# Patient Record
Sex: Female | Born: 1995 | Race: Black or African American | Hispanic: No | Marital: Single | Smoking: Never smoker
Health system: Southern US, Community
[De-identification: ages and names within clinical notes are randomized; demographics above are authoritative.]

## PROBLEM LIST (undated history)

## (undated) ENCOUNTER — Inpatient Hospital Stay (HOSPITAL_COMMUNITY): Payer: Self-pay

## (undated) ENCOUNTER — Ambulatory Visit (HOSPITAL_COMMUNITY): Admission: EM | Payer: No Typology Code available for payment source | Source: Home / Self Care

## (undated) ENCOUNTER — Ambulatory Visit: Source: Home / Self Care

## (undated) DIAGNOSIS — R51 Headache: Secondary | ICD-10-CM

## (undated) DIAGNOSIS — R112 Nausea with vomiting, unspecified: Secondary | ICD-10-CM

## (undated) DIAGNOSIS — Z9889 Other specified postprocedural states: Secondary | ICD-10-CM

## (undated) DIAGNOSIS — I10 Essential (primary) hypertension: Secondary | ICD-10-CM

## (undated) DIAGNOSIS — D649 Anemia, unspecified: Secondary | ICD-10-CM

## (undated) DIAGNOSIS — K2970 Gastritis, unspecified, without bleeding: Secondary | ICD-10-CM

## (undated) DIAGNOSIS — G43909 Migraine, unspecified, not intractable, without status migrainosus: Secondary | ICD-10-CM

## (undated) DIAGNOSIS — K219 Gastro-esophageal reflux disease without esophagitis: Secondary | ICD-10-CM

## (undated) DIAGNOSIS — N390 Urinary tract infection, site not specified: Secondary | ICD-10-CM

## (undated) DIAGNOSIS — R519 Headache, unspecified: Secondary | ICD-10-CM

## (undated) DIAGNOSIS — S62307A Unspecified fracture of fifth metacarpal bone, left hand, initial encounter for closed fracture: Secondary | ICD-10-CM

## (undated) HISTORY — PX: OTHER SURGICAL HISTORY: SHX169

## (undated) HISTORY — DX: Migraine, unspecified, not intractable, without status migrainosus: G43.909

## (undated) HISTORY — PX: WISDOM TOOTH EXTRACTION: SHX21

## (undated) HISTORY — PX: MOUTH SURGERY: SHX715

## (undated) HISTORY — PX: NO PAST SURGERIES: SHX2092

## (undated) HISTORY — DX: Essential (primary) hypertension: I10

## (undated) HISTORY — PX: MULTIPLE TOOTH EXTRACTIONS: SHX2053

---
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
## 2016-03-09 ENCOUNTER — Inpatient Hospital Stay (HOSPITAL_COMMUNITY)
Admission: AD | Admit: 2016-03-09 | Discharge: 2016-03-09 | Disposition: A | Discharge status: Home / Self Care | Payer: 59 | Source: Ambulatory Visit | Attending: Family Medicine | Admitting: Family Medicine

## 2016-03-09 ENCOUNTER — Encounter (HOSPITAL_COMMUNITY): Payer: Self-pay | Admitting: *Deleted

## 2016-03-09 DIAGNOSIS — K219 Gastro-esophageal reflux disease without esophagitis: Secondary | ICD-10-CM | POA: Diagnosis not present

## 2016-03-09 DIAGNOSIS — Z3202 Encounter for pregnancy test, result negative: Secondary | ICD-10-CM | POA: Insufficient documentation

## 2016-03-09 DIAGNOSIS — R112 Nausea with vomiting, unspecified: Secondary | ICD-10-CM | POA: Diagnosis not present

## 2016-03-09 DIAGNOSIS — R109 Unspecified abdominal pain: Secondary | ICD-10-CM | POA: Diagnosis present

## 2016-03-09 HISTORY — DX: Headache: R51

## 2016-03-09 HISTORY — DX: Headache, unspecified: R51.9

## 2016-03-09 LAB — URINALYSIS, ROUTINE W REFLEX MICROSCOPIC
BILIRUBIN URINE: NEGATIVE
Glucose, UA: NEGATIVE mg/dL
HGB URINE DIPSTICK: NEGATIVE
Ketones, ur: NEGATIVE mg/dL
Leukocytes, UA: NEGATIVE
Nitrite: NEGATIVE
PH: 7 (ref 5.0–8.0)
Protein, ur: NEGATIVE mg/dL
Specific Gravity, Urine: 1.015 (ref 1.005–1.030)

## 2016-03-09 LAB — POCT PREGNANCY, URINE: Preg Test, Ur: NEGATIVE

## 2016-03-09 MED ORDER — ONDANSETRON 4 MG PO TBDP: 4.0000 mg | ORAL_TABLET | Freq: Four times a day (QID) | ORAL | 0 refills | Status: DC | PRN

## 2016-03-09 MED ORDER — ONDANSETRON 4 MG PO TBDP
4.0000 mg | ORAL_TABLET | Freq: Four times a day (QID) | ORAL | Status: DC | PRN
  Administered 2016-03-09: 4 mg via ORAL
  Filled 2016-03-09: qty 1

## 2016-03-09 MED ORDER — GI COCKTAIL ~~LOC~~
30.0000 mL | Freq: Once | ORAL | Status: AC
  Administered 2016-03-09: 30 mL via ORAL
  Filled 2016-03-09: qty 30

## 2016-03-09 MED ORDER — OMEPRAZOLE 20 MG PO CPDR: 20.0000 mg | DELAYED_RELEASE_CAPSULE | Freq: Every day | ORAL | 1 refills | Status: DC

## 2016-03-09 NOTE — Discharge Instructions (Signed)
Nausea and Vomiting °Nausea is a sick feeling that often comes before throwing up (vomiting). Vomiting is a reflex where stomach contents come out of your mouth. Vomiting can cause severe loss of body fluids (dehydration). Children and elderly adults can become dehydrated quickly, especially if they also have diarrhea. Nausea and vomiting are symptoms of a condition or disease. It is important to find the cause of your symptoms. °CAUSES  °· Direct irritation of the stomach lining. This irritation can result from increased acid production (gastroesophageal reflux disease), infection, food poisoning, taking certain medicines (such as nonsteroidal anti-inflammatory drugs), alcohol use, or tobacco use. °· Signals from the brain. These signals could be caused by a headache, heat exposure, an inner ear disturbance, increased pressure in the brain from injury, infection, a tumor, or a concussion, pain, emotional stimulus, or metabolic problems. °· An obstruction in the gastrointestinal tract (bowel obstruction). °· Illnesses such as diabetes, hepatitis, gallbladder problems, appendicitis, kidney problems, cancer, sepsis, atypical symptoms of a heart attack, or eating disorders. °· Medical treatments such as chemotherapy and radiation. °· Receiving medicine that makes you sleep (general anesthetic) during surgery. °DIAGNOSIS °Your caregiver may ask for tests to be done if the problems do not improve after a few days. Tests may also be done if symptoms are severe or if the reason for the nausea and vomiting is not clear. Tests may include: °· Urine tests. °· Blood tests. °· Stool tests. °· Cultures (to look for evidence of infection). °· X-rays or other imaging studies. °Test results can help your caregiver make decisions about treatment or the need for additional tests. °TREATMENT °You need to stay well hydrated. Drink frequently but in small amounts. You may wish to drink water, sports drinks, clear broth, or eat frozen  ice pops or gelatin dessert to help stay hydrated. When you eat, eating slowly may help prevent nausea. There are also some antinausea medicines that may help prevent nausea. °HOME CARE INSTRUCTIONS  °· Take all medicine as directed by your caregiver. °· If you do not have an appetite, do not force yourself to eat. However, you must continue to drink fluids. °· If you have an appetite, eat a normal diet unless your caregiver tells you differently. °¨ Eat a variety of complex carbohydrates (rice, wheat, potatoes, bread), lean meats, yogurt, fruits, and vegetables. °¨ Avoid high-fat foods because they are more difficult to digest. °· Drink enough water and fluids to keep your urine clear or pale yellow. °· If you are dehydrated, ask your caregiver for specific rehydration instructions. Signs of dehydration may include: °¨ Severe thirst. °¨ Dry lips and mouth. °¨ Dizziness. °¨ Dark urine. °¨ Decreasing urine frequency and amount. °¨ Confusion. °¨ Rapid breathing or pulse. °SEEK IMMEDIATE MEDICAL CARE IF:  °· You have blood or brown flecks (like coffee grounds) in your vomit. °· You have black or bloody stools. °· You have a severe headache or stiff neck. °· You are confused. °· You have severe abdominal pain. °· You have chest pain or trouble breathing. °· You do not urinate at least once every 8 hours. °· You develop cold or clammy skin. °· You continue to vomit for longer than 24 to 48 hours. °· You have a fever. °MAKE SURE YOU:  °· Understand these instructions. °· Will watch your condition. °· Will get help right away if you are not doing well or get worse. °  °This information is not intended to replace advice given to you by your health care provider. Make sure   you discuss any questions you have with your health care provider. °  °Document Released: 06/01/2005 Document Revised: 08/24/2011 Document Reviewed: 10/29/2010 °Elsevier Interactive Patient Education ©2016 [employer] ° °Food Choices for Gastroesophageal  Reflux Disease, Adult °When you have gastroesophageal reflux disease (GERD), the foods you eat and your eating habits are very important. Choosing the right foods can help ease the discomfort of GERD. °WHAT GENERAL GUIDELINES DO I NEED TO FOLLOW? °· Choose fruits, vegetables, whole grains, low-fat dairy products, and low-fat meat, fish, and poultry. °· Limit fats such as oils, salad dressings, butter, nuts, and avocado. °· Keep a food diary to identify foods that cause symptoms. °· Avoid foods that cause reflux. These may be different for different people. °· Eat frequent small meals instead of three large meals each day. °· Eat your meals slowly, in a relaxed setting. °· Limit fried foods. °· Cook foods using methods other than frying. °· Avoid drinking alcohol. °· Avoid drinking large amounts of liquids with your meals. °· Avoid bending over or lying down until 2-3 hours after eating. °WHAT FOODS ARE NOT RECOMMENDED? °The following are some foods and drinks that may worsen your symptoms: °Vegetables °Tomatoes. Tomato juice. Tomato and spaghetti sauce. Chili peppers. Onion and garlic. Horseradish. °Fruits °Oranges, grapefruit, and lemon (fruit and juice). °Meats °High-fat meats, fish, and poultry. This includes hot dogs, ribs, ham, sausage, salami, and bacon. °Dairy °Whole milk and chocolate milk. Sour cream. Cream. Butter. Ice cream. Cream cheese.  °Beverages °Coffee and tea, with or without caffeine. Carbonated beverages or energy drinks. °Condiments °Hot sauce. Barbecue sauce.  °Sweets/Desserts °Chocolate and cocoa. Donuts. Peppermint and spearmint. °Fats and Oils °High-fat foods, including French fries and potato chips. °Other °Vinegar. Strong spices, such as black pepper, white pepper, red pepper, cayenne, curry powder, cloves, ginger, and chili powder. °The items listed above may not be a complete list of foods and beverages to avoid. Contact your dietitian for more information. °  °This information is not  intended to replace advice given to you by your health care provider. Make sure you discuss any questions you have with your health care provider. °  °Document Released: 06/01/2005 Document Revised: 06/22/2014 Document Reviewed: 04/05/2013 °Elsevier Interactive Patient Education ©2016 [employer] ° °

## 2016-03-09 NOTE — MAU Note (Signed)
Drenda FreezeFran C-Dishmon, CNM seeing patient in triage.

## 2016-03-09 NOTE — MAU Note (Addendum)
States she went to [facility] [place] 1 week ago for stomach pain. States they told her nothing was wrong. States she has had stomach pain for a week. States she has been vomiting and nothing stays down. States she has heart burn and mid-upper abd pain. States she feels weak and dizzy. States when she eats, her stomach starts cramping, then she feels nauseated and vomits.

## 2016-03-09 NOTE — MAU Provider Note (Signed)
None     Chief Complaint:  Abdominal Pain   Landry DykeKendra Punt is  20 y.o. No obstetric history on file..  Patient's last menstrual period was 03/06/2016 (exact date)..  Her pregnancy status is negative.  She presents complaining of Abdominal Pain  She has had nausea and vomiting since Sunday.  Went to doctor on Monday, was told it was probably viral.  Still having same sx.  States that "everything comes up that I put in my mouth except liquids."  Has tried eating bland foods without success. Has not taken any medications.  C/O heartburn, some reflux.    Given a GI cocktail and zofran .  Feels "much better".   Past Medical History:  Diagnosis Date  . Headache     Past Surgical History:  Procedure Laterality Date  . MOUTH SURGERY    . MOUTH SURGERY      Family History  Problem Relation Age of Onset  . Hypertension Mother     Social History  Substance Use Topics  . Smoking status: Never Smoker  . Smokeless tobacco: Never Used  . Alcohol use No    Allergies: No Known Allergies  Prescriptions Prior to Admission  Medication Sig Dispense Refill Last Dose  . ibuprofen (ADVIL,MOTRIN) 200 MG tablet Take 200 mg by mouth every 6 (six) hours as needed for cramping.   Past Week at Unknown time  . fluconazole (DIFLUCAN) 150 MG tablet Take 1 tablet (150 mg total) by mouth once. Repeat in 3 days if symptoms persist (Patient not taking: Reported on 03/09/2016) 2 tablet 0 Not Taking at Unknown time     Review of Systems   Constitutional: Negative for fever and chills Eyes: Negative for visual disturbances Respiratory: Negative for shortness of breath, dyspnea Cardiovascular: Negative for palpitations  Gastrointestinal: Negative for diarrhea and constipation Genitourinary: Negative for dysuria and urgency Musculoskeletal: Negative for back pain, joint pain, myalgias  Neurological: Negative for dizziness and headaches     Physical Exam   Blood pressure 125/81, pulse 73, resp. rate  18, height 5' (1.524 m), weight 49.4 kg (109 lb), last menstrual period 03/06/2016.  General: General appearance - alert, well appearing, and in no distress and oriented to person, place, and time.  Does not appear ill Chest - normal respiratory effort Heart - normal rate and regular rhythm Abdomen - soft, nontender, nondistended Pelvic - not indicated.  Extremities - no pedal edema noted   Labs: Results for orders placed or performed during the hospital encounter of 03/09/16 (from the past 24 hour(s))  Urinalysis, Routine w reflex microscopic (not at [facility])   Collection Time: 03/09/16  6:10 PM  Result Value Ref Range   Color, Urine YELLOW YELLOW   APPearance CLEAR CLEAR   Specific Gravity, Urine 1.015 1.005 - 1.030   pH 7.0 5.0 - 8.0   Glucose, UA NEGATIVE NEGATIVE mg/dL   Hgb urine dipstick NEGATIVE NEGATIVE   Bilirubin Urine NEGATIVE NEGATIVE   Ketones, ur NEGATIVE NEGATIVE mg/dL   Protein, ur NEGATIVE NEGATIVE mg/dL   Nitrite NEGATIVE NEGATIVE   Leukocytes, UA NEGATIVE NEGATIVE  Pregnancy, urine POC   Collection Time: 03/09/16  7:04 PM  Result Value Ref Range   Preg Test, Ur NEGATIVE NEGATIVE   Imaging Studies:  No results found.   Assessment: GI upset, reflux, unknown origin.  No s/s of dehydration  Plan: Rx zofran PRN, prilosec.  Marland Kitchen.  GI referral sent.  CRESENZO-DISHMAN,Jahiem Franzoni

## 2017-02-13 ENCOUNTER — Emergency Department (HOSPITAL_COMMUNITY)
Admission: EM | Admit: 2017-02-13 | Discharge: 2017-02-14 | Disposition: A | Discharge status: Home / Self Care | Payer: 59 | Attending: Emergency Medicine | Admitting: Emergency Medicine

## 2017-02-13 ENCOUNTER — Encounter (HOSPITAL_COMMUNITY): Payer: Self-pay | Admitting: Emergency Medicine

## 2017-02-13 DIAGNOSIS — Z79899 Other long term (current) drug therapy: Secondary | ICD-10-CM | POA: Insufficient documentation

## 2017-02-13 DIAGNOSIS — R102 Pelvic and perineal pain: Secondary | ICD-10-CM | POA: Insufficient documentation

## 2017-02-13 DIAGNOSIS — R1030 Lower abdominal pain, unspecified: Secondary | ICD-10-CM | POA: Diagnosis present

## 2017-02-13 DIAGNOSIS — R319 Hematuria, unspecified: Secondary | ICD-10-CM | POA: Insufficient documentation

## 2017-02-13 NOTE — ED Triage Notes (Signed)
Pt c/o lower abd cramping onset earlier today, worsening this evening. Discomfort when urine stream terminates.  Light red spotting. . +nausea.

## 2017-02-14 LAB — COMPREHENSIVE METABOLIC PANEL
ALK PHOS: 54 U/L (ref 38–126)
ALT: 14 U/L (ref 14–54)
ANION GAP: 9 (ref 5–15)
AST: 16 U/L (ref 15–41)
Albumin: 4.2 g/dL (ref 3.5–5.0)
BUN: 9 mg/dL (ref 6–20)
CALCIUM: 9.1 mg/dL (ref 8.9–10.3)
CHLORIDE: 105 mmol/L (ref 101–111)
CO2: 22 mmol/L (ref 22–32)
Creatinine, Ser: 0.86 mg/dL (ref 0.44–1.00)
GFR calc Af Amer: >60 mL/min (ref >60)
GFR calc non Af Amer: >60 mL/min (ref >60)
Glucose, Bld: 94 mg/dL (ref 65–99)
POTASSIUM: 3.8 mmol/L (ref 3.5–5.1)
SODIUM: 136 mmol/L (ref 135–145)
Total Bilirubin: 0.9 mg/dL (ref 0.3–1.2)
Total Protein: 7.1 g/dL (ref 6.5–8.1)

## 2017-02-14 LAB — WET PREP, GENITAL
Clue Cells Wet Prep HPF POC: NONE SEEN
SPERM: NONE SEEN
Trich, Wet Prep: NONE SEEN
YEAST WET PREP: NONE SEEN

## 2017-02-14 LAB — URINALYSIS, ROUTINE W REFLEX MICROSCOPIC
BILIRUBIN URINE: NEGATIVE
Bacteria, UA: NONE SEEN
GLUCOSE, UA: 50 mg/dL — AB
KETONES UR: NEGATIVE mg/dL
Nitrite: NEGATIVE
PH: 7 (ref 5.0–8.0)
Protein, ur: 100 mg/dL — AB
Specific Gravity, Urine: 1.018 (ref 1.005–1.030)

## 2017-02-14 LAB — LIPASE, BLOOD: LIPASE: 48 U/L (ref 11–51)

## 2017-02-14 LAB — CBC
HCT: 40.5 % (ref 36.0–46.0)
HEMOGLOBIN: 14.1 g/dL (ref 12.0–15.0)
MCH: 31 pg (ref 26.0–34.0)
MCHC: 34.8 g/dL (ref 30.0–36.0)
MCV: 89 fL (ref 78.0–100.0)
Platelets: 192 10*3/uL (ref 150–400)
RBC: 4.55 MIL/uL (ref 3.87–5.11)
RDW: 11.8 % (ref 11.5–15.5)
WBC: 8.9 10*3/uL (ref 4.0–10.5)

## 2017-02-14 LAB — POC URINE PREG, ED: Preg Test, Ur: NEGATIVE

## 2017-02-14 MED ORDER — CEPHALEXIN 500 MG PO CAPS: 500.0000 mg | ORAL_CAPSULE | Freq: Two times a day (BID) | ORAL | 0 refills | Status: AC

## 2017-02-14 MED ORDER — ONDANSETRON 4 MG PO TBDP
4.0000 mg | ORAL_TABLET | Freq: Once | ORAL | Status: AC
  Administered 2017-02-14: 4 mg via ORAL
  Filled 2017-02-14: qty 1

## 2017-02-14 NOTE — ED Notes (Signed)
ED Provider at bedside. 

## 2017-02-14 NOTE — ED Provider Notes (Signed)
[facility]-EMERGENCY DEPT Provider Note   CSN: [id number] Arrival date & time: 02/13/17  2329     History   Chief Complaint Chief Complaint  Patient presents with  . Abdominal Pain    HPI Elizabeth Shaffer is a 21 y.o. female.  The history is provided by the patient.  Abdominal Pain   This is a new problem. The current episode started yesterday. The problem occurs constantly. The problem has been gradually worsening. The pain is associated with an unknown factor. The pain is located in the suprapubic region. The quality of the pain is cramping. The pain is moderate. Associated symptoms include nausea. Pertinent negatives include fever, diarrhea, hematochezia, melena, vomiting, constipation, dysuria, frequency and hematuria. Nothing aggravates the symptoms. Nothing relieves the symptoms.   Noted light vaginal spotting last night. No vaginal discharge. No prior STI.   LMP: Oct 2017. On Depo. Last Depo shot was Jun 11.   Past Medical History:  Diagnosis Date  . Headache     There are no active problems to display for this patient.   Past Surgical History:  Procedure Laterality Date  . MOUTH SURGERY    . MOUTH SURGERY      OB History    Gravida Para Term Preterm AB Living   0 0 0 0 0 0   SAB TAB Ectopic Multiple Live Births   0 0 0 0 0       Home Medications    Prior to Admission medications   Medication Sig Start Date End Date Taking? Authorizing Provider  ibuprofen (ADVIL,MOTRIN) 200 MG tablet Take 200 mg by mouth every 6 (six) hours as needed for cramping.    [provider]  omeprazole (PRILOSEC) 20 MG capsule Take 1 capsule (20 mg total) by mouth daily. 03/09/16   Cresenzo-Dishmon, Scarlette CalicoFrances, CNM  ondansetron (ZOFRAN-ODT) 4 MG disintegrating tablet Take 1 tablet (4 mg total) by mouth every 6 (six) hours as needed for nausea or vomiting. 03/09/16   Jacklyn Shellresenzo-Dishmon, Frances, CNM    Family History Family History  Problem Relation Age of Onset  . Hypertension  Mother     Social History Social History  Substance Use Topics  . Smoking status: Never Smoker  . Smokeless tobacco: Never Used  . Alcohol use No     Allergies   Patient has no known allergies.   Review of Systems Review of Systems  Constitutional: Negative for fever.  Gastrointestinal: Positive for abdominal pain and nausea. Negative for constipation, diarrhea, hematochezia, melena and vomiting.  Genitourinary: Negative for dysuria, frequency and hematuria.  All other systems are reviewed and are negative for acute change except as noted in the HPI    Physical Exam Updated Vital Signs BP (!) 131/100 (BP Location: Left Arm)   Pulse 71   Temp 98.4 F (36.9 C) (Oral)   Resp 18   Ht 5' (1.524 m)   Wt 50.8 kg (112 lb)   LMP  (LMP Unknown)   SpO2 100%   BMI 21.87 kg/m   Physical Exam  Constitutional: She is oriented to person, place, and time. She appears well-developed and well-nourished. No distress.  HENT:  Head: Normocephalic and atraumatic.  Nose: Nose normal.  Eyes: Pupils are equal, round, and reactive to light. Conjunctivae and EOM are normal. Right eye exhibits no discharge. Left eye exhibits no discharge. No scleral icterus.  Neck: Normal range of motion. Neck supple.  Cardiovascular: Normal rate and regular rhythm.  Exam reveals no gallop and no friction rub.  No murmur heard. Pulmonary/Chest: Effort normal and breath sounds normal. No stridor. No respiratory distress. She has no rales.  Abdominal: Soft. She exhibits no distension. There is tenderness (mild discomfort) in the suprapubic area. There is no rigidity, no rebound, no guarding, no CVA tenderness, no tenderness at McBurney's point and negative Murphy's sign.  Genitourinary: Pelvic exam was performed with patient supine. Uterus is tender (mild discomfort). Cervix exhibits no motion tenderness, no discharge and no friability. Right adnexum displays no mass and no tenderness. Left adnexum displays no  mass and no tenderness. Vaginal discharge (mild, white) found.  Musculoskeletal: She exhibits no edema or tenderness.  Neurological: She is alert and oriented to person, place, and time.  Skin: Skin is warm and dry. No rash noted. She is not diaphoretic. No erythema.  Psychiatric: She has a normal mood and affect.  Vitals reviewed.    ED Treatments / Results  Labs (all labs ordered are listed, but only abnormal results are displayed) Labs Reviewed  WET PREP, GENITAL - Abnormal; Notable for the following:       Result Value   WBC, Wet Prep HPF POC FEW (*)    All other components within normal limits  URINALYSIS, ROUTINE W REFLEX MICROSCOPIC - Abnormal; Notable for the following:    APPearance CLOUDY (*)    Glucose, UA 50 (*)    Hgb urine dipstick LARGE (*)    Protein, ur 100 (*)    Leukocytes, UA MODERATE (*)    Squamous Epithelial / LPF 0-5 (*)    All other components within normal limits  LIPASE, BLOOD  COMPREHENSIVE METABOLIC PANEL  CBC  POC URINE PREG, ED  GC/CHLAMYDIA PROBE AMP ([place]) NOT AT [facility]    EKG  EKG Interpretation None       Radiology No results found.  Procedures Procedures (including critical care time)  Medications Ordered in ED Medications  ondansetron (ZOFRAN-ODT) disintegrating tablet 4 mg (4 mg Oral Given 02/14/17 0051)     Initial Impression / Assessment and Plan / ED Course  I have reviewed the triage vital signs and the nursing notes.  Pertinent labs & imaging results that were available during my care of the patient were reviewed by me and considered in my medical decision making (see chart for details).       UPT negative. Abdomen with mild suprapubic discomfort. Pelvic exam not concerning for cervicitis/PID. Wet prep without evidence of Trichomonas or bacterial vaginosis. GC/Chlamydia culture sent. UA notable for hematuria, with too numerous to count rbc's and white blood cells; positive leukocytes. Patient is endorsing  frequencies/urgency but no dysuria. Discussed the possibility of urinary tract infection but she reports this does not feel like her previous UTIs.  This may be secondary to commencing her menstrual cycle after coming off of Depo-Provera, given the vaginal spotting.   Low suspicion for serious intra-abdominal inflammatory/infectious process. No suspicion for ovarian torsion or TOA.  We'll provide the patient with prescription for antibiotics for possible urinary tract infection. Patient will watch and wait to see if her symptomatology resolves. If not she will commence antibiotic treatment.   Final Clinical Impressions(s) / ED Diagnoses   Final diagnoses:  None   Disposition: Discharge  Condition: Good  I have discussed the results, Dx and Tx plan with the patient who expressed understanding and agree(s) with the plan. Discharge instructions discussed at great length. The patient was given strict return precautions who verbalized understanding of the instructions. No further questions at time  of discharge.    New Prescriptions   CEPHALEXIN (KEFLEX) 500 MG CAPSULE    Take 1 capsule (500 mg total) by mouth 2 (two) times daily.    Follow Up: Primary care provider   in 3-5 days, If symptoms do not improve or  worsen      Cardama, Amadeo Garnet, MD 02/14/17 [phone]

## 2017-02-14 NOTE — ED Notes (Signed)
Pt ambulatory to restroom

## 2017-02-18 LAB — GC/CHLAMYDIA PROBE AMP (~~LOC~~) NOT AT ARMC
CHLAMYDIA, DNA PROBE: NEGATIVE
NEISSERIA GONORRHEA: NEGATIVE

## 2017-08-19 DIAGNOSIS — K219 Gastro-esophageal reflux disease without esophagitis: Secondary | ICD-10-CM | POA: Insufficient documentation

## 2018-04-28 ENCOUNTER — Ambulatory Visit (HOSPITAL_COMMUNITY)
Admission: EM | Admit: 2018-04-28 | Discharge: 2018-04-28 | Disposition: A | Discharge status: Home / Self Care | Payer: Medicaid Other | Attending: Family Medicine | Admitting: Family Medicine

## 2018-04-28 ENCOUNTER — Encounter (HOSPITAL_COMMUNITY): Payer: Self-pay | Admitting: Emergency Medicine

## 2018-04-28 ENCOUNTER — Other Ambulatory Visit: Payer: Self-pay

## 2018-04-28 DIAGNOSIS — R109 Unspecified abdominal pain: Secondary | ICD-10-CM | POA: Diagnosis not present

## 2018-04-28 DIAGNOSIS — R197 Diarrhea, unspecified: Secondary | ICD-10-CM

## 2018-04-28 DIAGNOSIS — Z79899 Other long term (current) drug therapy: Secondary | ICD-10-CM | POA: Insufficient documentation

## 2018-04-28 DIAGNOSIS — Z8249 Family history of ischemic heart disease and other diseases of the circulatory system: Secondary | ICD-10-CM | POA: Diagnosis not present

## 2018-04-28 DIAGNOSIS — N390 Urinary tract infection, site not specified: Secondary | ICD-10-CM

## 2018-04-28 LAB — POCT URINALYSIS DIP (DEVICE)
Bilirubin Urine: NEGATIVE
GLUCOSE, UA: NEGATIVE mg/dL
KETONES UR: 15 mg/dL — AB
Nitrite: POSITIVE — AB
Protein, ur: NEGATIVE mg/dL
Urobilinogen, UA: 0.2 mg/dL (ref 0.0–1.0)
pH: 7 (ref 5.0–8.0)

## 2018-04-28 LAB — POCT PREGNANCY, URINE: Preg Test, Ur: NEGATIVE

## 2018-04-28 MED ORDER — PSYLLIUM 58.6 % PO PACK: 1.0000 | PACK | Freq: Every day | ORAL | 1 refills | Status: DC

## 2018-04-28 MED ORDER — NITROFURANTOIN MONOHYD MACRO 100 MG PO CAPS: 100.0000 mg | ORAL_CAPSULE | Freq: Two times a day (BID) | ORAL | 0 refills | Status: DC

## 2018-04-28 NOTE — ED Provider Notes (Signed)
[facility]   [id number] 04/28/18 Arrival Time: 1447  CC: Loose stools  SUBJECTIVE:  Elizabeth Shaffer is a 22 y.o. female who presents with complaint of abdominal cramping followed by a loose stool 3-5x daily for the past week ago.  Denies a precipitating event, trauma, close contacts with similar symptoms, recent travel or antibiotic use.  Denies association with food.  Denies abdominal pain.  Has not tried OTC medications.  Denies alleviating or aggravating factors.  Denies similar symptoms in the past.  Last BM today with loose stools.  Denies urinary symptoms.    Denies fever, chills, appetite changes, weight changes, nausea, vomiting, chest pain, SOB, diarrhea, constipation, hematochezia, melena, dysuria, difficulty urinating, increased frequency or urgency, flank pain, loss of bowel or bladder function, vaginal discharge, vaginal odor, vaginal bleeding, dyspareunia, pelvic pain.     Patient's last menstrual period was 04/16/2018.  ROS: As per HPI.  Past Medical History:  Diagnosis Date  . Headache    Past Surgical History:  Procedure Laterality Date  . MOUTH SURGERY    . MOUTH SURGERY     No Known Allergies No current facility-administered medications on file prior to encounter.    Current Outpatient Medications on File Prior to Encounter  Medication Sig Dispense Refill  . omeprazole (PRILOSEC) 20 MG capsule Take 1 capsule (20 mg total) by mouth daily. 30 capsule 1  . ibuprofen (ADVIL,MOTRIN) 200 MG tablet Take 200 mg by mouth every 6 (six) hours as needed for cramping.     Social History   Socioeconomic History  . Marital status: Single    Spouse name: Not on file  . Number of children: Not on file  . Years of education: Not on file  . Highest education level: Not on file  Occupational History  . Not on file  Social Needs  . Financial resource strain: Not on file  . Food insecurity:    Worry: Not on file    Inability: Not on file  . Transportation  needs:    Medical: Not on file    Non-medical: Not on file  Tobacco Use  . Smoking status: Never Smoker  . Smokeless tobacco: Never Used  Substance and Sexual Activity  . Alcohol use: No  . Drug use: Yes    Types: Marijuana  . Sexual activity: Never    Birth control/protection: Abstinence  Lifestyle  . Physical activity:    Days per week: Not on file    Minutes per session: Not on file  . Stress: Not on file  Relationships  . Social connections:    Talks on phone: Not on file    Gets together: Not on file    Attends religious service: Not on file    Active member of club or organization: Not on file    Attends meetings of clubs or organizations: Not on file    Relationship status: Not on file  . Intimate partner violence:    Fear of current or ex partner: Not on file    Emotionally abused: Not on file    Physically abused: Not on file    Forced sexual activity: Not on file  Other Topics Concern  . Not on file  Social History Narrative  . Not on file   Family History  Problem Relation Age of Onset  . Hypertension Mother      OBJECTIVE:  Vitals:   04/28/18 1617  BP: 121/79  Pulse: 74  Resp: 18  Temp: 98.2 F (36.8  C)  TempSrc: Oral  SpO2: 100%    General appearance: Alert; NAD; nontoxic appearing HEENT: NCAT.  Oropharynx clear.  Lungs: clear to auscultation bilaterally without adventitious breath sounds Heart: regular rate and rhythm.  Radial pulses 2+ symmetrical bilaterally Abdomen: soft, non-distended; normal active bowel sounds; non-tender to light and deep palpation; nontender at McBurney's point; no guarding Back: no CVA tenderness Extremities: no edema; symmetrical with no gross deformities Skin: warm and dry Neurologic: normal gait Psychological: alert and cooperative; normal mood and affect  LABS: Results for orders placed or performed during the hospital encounter of 04/28/18 (from the past 24 hour(s))  POCT urinalysis dip (device)     Status:  Abnormal   Collection Time: 04/28/18  4:33 PM  Result Value Ref Range   Glucose, UA NEGATIVE NEGATIVE mg/dL   Bilirubin Urine NEGATIVE NEGATIVE   Ketones, ur 15 (A) NEGATIVE mg/dL   Specific Gravity, Urine >=1.030 1.005 - 1.030   Hgb urine dipstick TRACE (A) NEGATIVE   pH 7.0 5.0 - 8.0   Protein, ur NEGATIVE NEGATIVE mg/dL   Urobilinogen, UA 0.2 0.0 - 1.0 mg/dL   Nitrite POSITIVE (A) NEGATIVE   Leukocytes, UA SMALL (A) NEGATIVE  Pregnancy, urine POC     Status: None   Collection Time: 04/28/18  4:40 PM  Result Value Ref Range   Preg Test, Ur NEGATIVE NEGATIVE    ASSESSMENT & PLAN:  1. Diarrhea, unspecified type   2. Lower urinary tract infectious disease     Meds ordered this encounter  Medications  . psyllium (METAMUCIL) 58.6 % packet    Sig: Take 1 packet by mouth daily.    Dispense:  15 each    Refill:  1    Order Specific Question:   Supervising Provider    Answer:   Isa RankinMURRAY, LAURA WILSON [phone][[id number]]  . nitrofurantoin, macrocrystal-monohydrate, (MACROBID) 100 MG capsule    Sig: Take 1 capsule (100 mg total) by mouth 2 (two) times daily.    Dispense:  10 capsule    Refill:  0    Order Specific Question:   Supervising Provider    Answer:   Isa RankinMURRAY, LAURA WILSON [[id number]]   Urine did show signs of possible urinary tract infection.  We will send out to culture.   Macrobid prescribed.  Use as directed and to completion.   Get rest and push fluids Eat a well-balanced diet of fruit, vegetables and lean meat Metamucil prescribed.  Use as directed for symptomatic relief Follow up with GI specialist if symptoms persists If you experience new or worsening symptoms return or go to ER such as fever, chills, nausea, vomiting, diarrhea, bloody or dark tarry stools, constipation, urinary symptoms, worsening abdominal discomfort, symptoms that do not improve with medications, inability to keep fluids down, etc...  Reviewed expectations re: course of current medical issues. Questions  answered. Outlined signs and symptoms indicating need for more acute intervention. Patient verbalized understanding. After Visit Summary given.   Rennis HardingWurst, Serafino Burciaga, PA-C 04/28/18 1704

## 2018-04-28 NOTE — ED Triage Notes (Signed)
Abdominal pain started last Thursday 04/21/18.  Denies urinary symptoms.  Patient gets a crampy feeling and has a soft stool that has occurred every day.  Pain in flank area intermittnely

## 2018-04-28 NOTE — Discharge Instructions (Signed)
Urine did show signs of possible urinary tract infection.  We will send out to culture.   Macrobid prescribed.  Use as directed and to completion.   Get rest and push fluids Eat a well-balanced diet of fruit, vegetables and lean meat Metamucil prescribed.  Use as directed for symptomatic relief Follow up with GI specialist if symptoms persists If you experience new or worsening symptoms return or go to ER such as fever, chills, nausea, vomiting, diarrhea, bloody or dark tarry stools, constipation, urinary symptoms, worsening abdominal discomfort, symptoms that do not improve with medications, inability to keep fluids down, etc...Marland Kitchen

## 2018-04-30 LAB — URINE CULTURE

## 2018-05-06 ENCOUNTER — Inpatient Hospital Stay (EMERGENCY_DEPARTMENT_HOSPITAL)
Admission: AD | Admit: 2018-05-06 | Discharge: 2018-05-07 | Disposition: A | Discharge status: Home / Self Care | Payer: Medicaid Other | Source: Ambulatory Visit | Attending: Obstetrics & Gynecology | Admitting: Obstetrics & Gynecology

## 2018-05-06 ENCOUNTER — Encounter (HOSPITAL_COMMUNITY): Payer: Self-pay | Admitting: *Deleted

## 2018-05-06 DIAGNOSIS — Y999 Unspecified external cause status: Secondary | ICD-10-CM | POA: Diagnosis not present

## 2018-05-06 DIAGNOSIS — O3680X0 Pregnancy with inconclusive fetal viability, not applicable or unspecified: Secondary | ICD-10-CM

## 2018-05-06 DIAGNOSIS — S161XXA Strain of muscle, fascia and tendon at neck level, initial encounter: Secondary | ICD-10-CM | POA: Diagnosis not present

## 2018-05-06 DIAGNOSIS — B9689 Other specified bacterial agents as the cause of diseases classified elsewhere: Secondary | ICD-10-CM

## 2018-05-06 DIAGNOSIS — O2341 Unspecified infection of urinary tract in pregnancy, first trimester: Secondary | ICD-10-CM | POA: Insufficient documentation

## 2018-05-06 DIAGNOSIS — O26891 Other specified pregnancy related conditions, first trimester: Secondary | ICD-10-CM

## 2018-05-06 DIAGNOSIS — Y929 Unspecified place or not applicable: Secondary | ICD-10-CM | POA: Diagnosis not present

## 2018-05-06 DIAGNOSIS — Y9389 Activity, other specified: Secondary | ICD-10-CM | POA: Diagnosis not present

## 2018-05-06 DIAGNOSIS — Z3A01 Less than 8 weeks gestation of pregnancy: Secondary | ICD-10-CM

## 2018-05-06 DIAGNOSIS — R109 Unspecified abdominal pain: Principal | ICD-10-CM

## 2018-05-06 DIAGNOSIS — O219 Vomiting of pregnancy, unspecified: Secondary | ICD-10-CM | POA: Insufficient documentation

## 2018-05-06 DIAGNOSIS — R0789 Other chest pain: Secondary | ICD-10-CM | POA: Diagnosis not present

## 2018-05-06 DIAGNOSIS — R1030 Lower abdominal pain, unspecified: Secondary | ICD-10-CM | POA: Insufficient documentation

## 2018-05-06 DIAGNOSIS — Z79899 Other long term (current) drug therapy: Secondary | ICD-10-CM | POA: Diagnosis not present

## 2018-05-06 HISTORY — DX: Gastro-esophageal reflux disease without esophagitis: K21.9

## 2018-05-06 LAB — URINALYSIS, ROUTINE W REFLEX MICROSCOPIC
Bilirubin Urine: NEGATIVE
GLUCOSE, UA: NEGATIVE mg/dL
HGB URINE DIPSTICK: NEGATIVE
KETONES UR: 80 mg/dL — AB
Leukocytes, UA: NEGATIVE
Nitrite: NEGATIVE
PH: 5 (ref 5.0–8.0)
Protein, ur: NEGATIVE mg/dL
Specific Gravity, Urine: 1.032 — ABNORMAL HIGH (ref 1.005–1.030)

## 2018-05-06 LAB — POCT PREGNANCY, URINE: Preg Test, Ur: POSITIVE — AB

## 2018-05-06 LAB — COMPREHENSIVE METABOLIC PANEL
ALK PHOS: 55 U/L (ref 38–126)
ALT: 24 U/L (ref 0–44)
ANION GAP: 9 (ref 5–15)
AST: 23 U/L (ref 15–41)
Albumin: 4.5 g/dL (ref 3.5–5.0)
BUN: 18 mg/dL (ref 6–20)
CALCIUM: 9.5 mg/dL (ref 8.9–10.3)
CO2: 21 mmol/L — ABNORMAL LOW (ref 22–32)
CREATININE: 0.73 mg/dL (ref 0.44–1.00)
Chloride: 106 mmol/L (ref 98–111)
GFR calc Af Amer: >60 mL/min (ref >60)
Glucose, Bld: 104 mg/dL — ABNORMAL HIGH (ref 70–99)
Potassium: 4.1 mmol/L (ref 3.5–5.1)
Sodium: 136 mmol/L (ref 135–145)
TOTAL PROTEIN: 7.7 g/dL (ref 6.5–8.1)
Total Bilirubin: 1.2 mg/dL (ref 0.3–1.2)

## 2018-05-06 LAB — CBC
HCT: 39.3 % (ref 36.0–46.0)
HEMOGLOBIN: 13.5 g/dL (ref 12.0–15.0)
MCH: 32.3 pg (ref 26.0–34.0)
MCHC: 34.4 g/dL (ref 30.0–36.0)
MCV: 94 fL (ref 80.0–100.0)
Platelets: 206 10*3/uL (ref 150–400)
RBC: 4.18 MIL/uL (ref 3.87–5.11)
RDW: 12.4 % (ref 11.5–15.5)
WBC: 9.6 10*3/uL (ref 4.0–10.5)
nRBC: 0 % (ref 0.0–0.2)

## 2018-05-06 LAB — LIPASE, BLOOD: LIPASE: 38 U/L (ref 11–51)

## 2018-05-06 LAB — WET PREP, GENITAL
Sperm: NONE SEEN
TRICH WET PREP: NONE SEEN
Yeast Wet Prep HPF POC: NONE SEEN

## 2018-05-06 MED ORDER — PROMETHAZINE HCL 25 MG PO TABS
12.5000 mg | ORAL_TABLET | Freq: Once | ORAL | Status: AC
  Administered 2018-05-07: 12.5 mg via ORAL
  Filled 2018-05-06: qty 1

## 2018-05-06 MED ORDER — CEPHALEXIN 500 MG PO CAPS
500.0000 mg | ORAL_CAPSULE | Freq: Once | ORAL | Status: AC
  Administered 2018-05-07: 500 mg via ORAL
  Filled 2018-05-06: qty 1

## 2018-05-06 NOTE — MAU Provider Note (Signed)
Chief Complaint: Back Pain   First Provider Initiated Contact with Patient 05/06/18 2336      SUBJECTIVE HPI: Elizabeth Shaffer Isais is a 22 y.o. G0P0000 at Unknown by LMP who presents to maternity admissions reporting cramping lower abdominal pain x 2-3 weeks.  [facility] for cramping and diarrhea and had negative pregnancy test and UTI. She only took one antibiotic pill because it make her feel nauseous and dizzy.  She reports the diarrhea resolved but the cramping continues. LMP was end of September but her periods are a little irregular.  There are no other symptoms.  She has not tried any other treatments.  She denies vaginal bleeding, vaginal itching/burning, urinary symptoms, h/a, dizziness, or fever/chills.     HPI  Past Medical History:  Diagnosis Date  . GERD (gastroesophageal reflux disease)   . Headache    Past Surgical History:  Procedure Laterality Date  . MOUTH SURGERY    . MOUTH SURGERY     Social History   Socioeconomic History  . Marital status: Single    Spouse name: Not on file  . Number of children: Not on file  . Years of education: Not on file  . Highest education level: Not on file  Occupational History  . Not on file  Social Needs  . Financial resource strain: Not on file  . Food insecurity:    Worry: Not on file    Inability: Not on file  . Transportation needs:    Medical: Not on file    Non-medical: Not on file  Tobacco Use  . Smoking status: Never Smoker  . Smokeless tobacco: Never Used  Substance and Sexual Activity  . Alcohol use: Yes    Comment: on occ  . Drug use: Yes    Types: Marijuana  . Sexual activity: Yes    Birth control/protection: None  Lifestyle  . Physical activity:    Days per week: Not on file    Minutes per session: Not on file  . Stress: Not on file  Relationships  . Social connections:    Talks on phone: Not on file    Gets together: Not on file    Attends religious service: Not on file    Active  member of club or organization: Not on file    Attends meetings of clubs or organizations: Not on file    Relationship status: Not on file  . Intimate partner violence:    Fear of current or ex partner: Not on file    Emotionally abused: Not on file    Physically abused: Not on file    Forced sexual activity: Not on file  Other Topics Concern  . Not on file  Social History Narrative  . Not on file   No current facility-administered medications on file prior to encounter.    Current Outpatient Medications on File Prior to Encounter  Medication Sig Dispense Refill  . omeprazole (PRILOSEC) 20 MG capsule Take 1 capsule (20 mg total) by mouth daily. 30 capsule 1   No Known Allergies  ROS:  Review of Systems  Constitutional: Negative for chills, fatigue and fever.  Respiratory: Negative for shortness of breath.   Cardiovascular: Negative for chest pain.  Gastrointestinal: Positive for abdominal pain. Negative for nausea and vomiting.  Genitourinary: Positive for pelvic pain. Negative for difficulty urinating, dysuria, flank pain, vaginal bleeding, vaginal discharge and vaginal pain.  Neurological: Negative for dizziness and headaches.  Psychiatric/Behavioral: Negative.  I have reviewed patient's Past Medical Hx, Surgical Hx, Family Hx, Social Hx, medications and allergies.   Physical Exam   Patient Vitals for the past 24 hrs:  BP Temp Pulse Resp Height Weight  05/07/18 0116 119/74 98 F (36.7 C) 87 18 - -  05/06/18 2240 127/76 98.3 F (36.8 C) 74 - 5\' 1"  (1.549 m) 48.1 kg   Constitutional: Well-developed, well-nourished female in no acute distress.  Cardiovascular: normal rate Respiratory: normal effort GI: Abd soft, non-tender. Pos BS x 4 MS: Extremities nontender, no edema, normal ROM Neurologic: Alert and oriented x 4.  GU: Neg CVAT.  PELVIC EXAM: Wet prep and GCC collected by blind swab   LAB RESULTS Results for orders placed or performed during the hospital  encounter of 05/06/18 (from the past 24 hour(s))  Urinalysis, Routine w reflex microscopic     Status: Abnormal   Collection Time: 05/06/18 10:44 PM  Result Value Ref Range   Color, Urine YELLOW YELLOW   APPearance CLEAR CLEAR   Specific Gravity, Urine 1.032 (H) 1.005 - 1.030   pH 5.0 5.0 - 8.0   Glucose, UA NEGATIVE NEGATIVE mg/dL   Hgb urine dipstick NEGATIVE NEGATIVE   Bilirubin Urine NEGATIVE NEGATIVE   Ketones, ur 80 (A) NEGATIVE mg/dL   Protein, ur NEGATIVE NEGATIVE mg/dL   Nitrite NEGATIVE NEGATIVE   Leukocytes, UA NEGATIVE NEGATIVE  Pregnancy, urine POC     Status: Abnormal   Collection Time: 05/06/18 11:01 PM  Result Value Ref Range   Preg Test, Ur POSITIVE (A) NEGATIVE  Wet prep, genital     Status: Abnormal   Collection Time: 05/06/18 11:04 PM  Result Value Ref Range   Yeast Wet Prep HPF POC NONE SEEN NONE SEEN   Trich, Wet Prep NONE SEEN NONE SEEN   Clue Cells Wet Prep HPF POC PRESENT (A) NONE SEEN   WBC, Wet Prep HPF POC MODERATE (A) NONE SEEN   Sperm NONE SEEN   CBC     Status: None   Collection Time: 05/06/18 11:16 PM  Result Value Ref Range   WBC 9.6 4.0 - 10.5 K/uL   RBC 4.18 3.87 - 5.11 MIL/uL   Hemoglobin 13.5 12.0 - 15.0 g/dL   HCT 29.5 62.1 - 30.8 %   MCV 94.0 80.0 - 100.0 fL   MCH 32.3 26.0 - 34.0 pg   MCHC 34.4 30.0 - 36.0 g/dL   RDW 65.7 84.6 - 96.2 %   Platelets 206 150 - 400 K/uL   nRBC 0.0 0.0 - 0.2 %  Comprehensive metabolic panel     Status: Abnormal   Collection Time: 05/06/18 11:16 PM  Result Value Ref Range   Sodium 136 135 - 145 mmol/L   Potassium 4.1 3.5 - 5.1 mmol/L   Chloride 106 98 - 111 mmol/L   CO2 21 (L) 22 - 32 mmol/L   Glucose, Bld 104 (H) 70 - 99 mg/dL   BUN 18 6 - 20 mg/dL   Creatinine, Ser 9.52 0.44 - 1.00 mg/dL   Calcium 9.5 8.9 - 84.1 mg/dL   Total Protein 7.7 6.5 - 8.1 g/dL   Albumin 4.5 3.5 - 5.0 g/dL   AST 23 15 - 41 U/L   ALT 24 0 - 44 U/L   Alkaline Phosphatase 55 38 - 126 U/L   Total Bilirubin 1.2 0.3 - 1.2  mg/dL   GFR calc non Af Amer >60 >60 mL/min   GFR calc Af Amer >60 >60 mL/min  Anion gap 9 5 - 15  Lipase, blood     Status: None   Collection Time: 05/06/18 11:16 PM  Result Value Ref Range   Lipase 38 11 - 51 U/L  HIV Antibody (routine testing w rflx)     Status: None   Collection Time: 05/06/18 11:16 PM  Result Value Ref Range   HIV Screen 4th Generation wRfx Non Reactive Non Reactive  ABO/Rh     Status: None   Collection Time: 05/06/18 11:16 PM  Result Value Ref Range   ABO/RH(D)      B POS Performed at [facility], [address], [place], [place] [address]   hCG, quantitative, pregnancy     Status: Abnormal   Collection Time: 05/06/18 11:17 PM  Result Value Ref Range   hCG, Beta Chain, Quant, S 292 (H) <5 mIU/mL    --/--/B POS Performed at [facility], [address], [place], [place] [address]  [phone] [id number])  IMAGING US Ob Less Than 14 Weeks With Ob Transvaginal  Result Date: 05/07/2018 CLINICAL DATA:  Abdominal pain. Positive urine pregnancy test. Estimated gestational age by LMP is 7 weeks 3 days. EXAM: OBSTETRIC <14 WK [place] AND TRANSVAGINAL OB US TECHNIQUE: Both transabdominal and transvaginal ultrasound examinations were performed for complete evaluation of the gestation as well as the maternal uterus, adnexal regions, and pelvic cul-de-sac. Transvaginal technique was performed to assess early pregnancy. COMPARISON:  None. FINDINGS: Intrauterine gestational sac: A tiny intrauterine gestational sac is identified. Yolk sac:  Not Visualized. Embryo:  Not Visualized. Cardiac Activity: Not Visualized. MSD: 1.7 mm, too small for dating. Subchorionic hemorrhage:  None visualized. Maternal uterus/adnexae: Uterus is anteverted. No myometrial mass lesions identified. Both ovaries are visualized and appear normal. Corpus luteal cyst on the left. No pelvic fluid collections. IMPRESSION: Probable early intrauterine gestational sac, but no yolk sac, fetal pole, or cardiac  activity yet visualized. Recommend follow-up quantitative B-HCG levels and follow-up US in 14 days to assess viability. This recommendation follows SRU consensus guidelines: Diagnostic Criteria for Nonviable Pregnancy Early in the First Trimester. Malva Limes Med 2013; 098:1191-47. Electronically Signed   By: Burman Nieves M.D.   On: 05/07/2018 00:33    MAU Management/MDM: Ordered labs and [place] and reviewed results.  Findings today could represent a normal early pregnancy, spontaneous abortion or ectopic pregnancy which can be life-threatening.  Ectopic precautions were given to the patient with plan to return in 48 hours for repeat quant hcg to evaluate pregnancy development.  Appt made for 11/25 in [facility] office for stat hcg.  Pt was unaware of pregnancy but is happy and hopeful that it is developing normally after finding out tonight.  UA tonight is normal except ketones but on 04/28/18 urine with nitrites. Pt did not complete abx course so Rx for Keflex course and Phenergan for nausea sent to pt pharmacy. Pt discharged with strict return/ectopic precautions.  ASSESSMENT 1. Abdominal pain during pregnancy in first trimester   2. Pregnancy of unknown anatomic location   3. Urinary tract infection in mother during first trimester of pregnancy   4. Nausea and vomiting during pregnancy prior to [redacted] weeks gestation     PLAN Discharge home Allergies as of 05/07/2018   No Known Allergies     Medication List    STOP taking these medications   ibuprofen 200 MG tablet Commonly known as:  ADVIL,MOTRIN   nitrofurantoin (macrocrystal-monohydrate) 100 MG capsule Commonly known as:  MACROBID   psyllium 58.6 %  packet Commonly known as:  METAMUCIL     TAKE these medications   cephALEXin 500 MG capsule Commonly known as:  KEFLEX Take 1 capsule (500 mg total) by mouth 3 (three) times daily.   omeprazole 20 MG capsule Commonly known as:  PRILOSEC Take 1 capsule (20 mg total) by mouth daily.    promethazine 25 MG tablet Commonly known as:  PHENERGAN Take 0.5-1 tablets (12.5-25 mg total) by mouth every 6 (six) hours as needed for nausea.      Follow-up Information    [facility] Follow up.   Specialty:  Obstetrics and Gynecology Why:  Follow up with labs on Monday 05/09/18 at 9 am as scheduled. Return to MAU as needed for emergencies. Contact information: [address] [place] [address] [phone]          Sharen Counter Certified Nurse-Midwife 05/07/2018  8:59 PM

## 2018-05-06 NOTE — Progress Notes (Signed)
Specimens collected by blind swab by RN

## 2018-05-06 NOTE — MAU Note (Signed)
Having lower back pain and stomach cramps for weeks. Denies vag bleeding or d/c.

## 2018-05-07 ENCOUNTER — Other Ambulatory Visit: Payer: Self-pay

## 2018-05-07 ENCOUNTER — Emergency Department (HOSPITAL_COMMUNITY): Payer: Medicaid Other

## 2018-05-07 ENCOUNTER — Encounter (HOSPITAL_COMMUNITY): Payer: Self-pay

## 2018-05-07 ENCOUNTER — Encounter (HOSPITAL_COMMUNITY): Payer: Self-pay | Admitting: *Deleted

## 2018-05-07 ENCOUNTER — Emergency Department (HOSPITAL_COMMUNITY)
Admission: EM | Admit: 2018-05-07 | Discharge: 2018-05-07 | Disposition: A | Discharge status: Home / Self Care | Payer: Medicaid Other | Attending: Emergency Medicine | Admitting: Emergency Medicine

## 2018-05-07 ENCOUNTER — Inpatient Hospital Stay (HOSPITAL_COMMUNITY): Payer: Medicaid Other

## 2018-05-07 DIAGNOSIS — Y999 Unspecified external cause status: Secondary | ICD-10-CM | POA: Insufficient documentation

## 2018-05-07 DIAGNOSIS — Y929 Unspecified place or not applicable: Secondary | ICD-10-CM | POA: Insufficient documentation

## 2018-05-07 DIAGNOSIS — Z79899 Other long term (current) drug therapy: Secondary | ICD-10-CM | POA: Insufficient documentation

## 2018-05-07 DIAGNOSIS — R109 Unspecified abdominal pain: Secondary | ICD-10-CM

## 2018-05-07 DIAGNOSIS — Z3A01 Less than 8 weeks gestation of pregnancy: Secondary | ICD-10-CM

## 2018-05-07 DIAGNOSIS — O3680X0 Pregnancy with inconclusive fetal viability, not applicable or unspecified: Secondary | ICD-10-CM | POA: Diagnosis not present

## 2018-05-07 DIAGNOSIS — O2341 Unspecified infection of urinary tract in pregnancy, first trimester: Secondary | ICD-10-CM

## 2018-05-07 DIAGNOSIS — R0789 Other chest pain: Secondary | ICD-10-CM | POA: Insufficient documentation

## 2018-05-07 DIAGNOSIS — Y9389 Activity, other specified: Secondary | ICD-10-CM | POA: Insufficient documentation

## 2018-05-07 DIAGNOSIS — S161XXA Strain of muscle, fascia and tendon at neck level, initial encounter: Secondary | ICD-10-CM | POA: Insufficient documentation

## 2018-05-07 DIAGNOSIS — O219 Vomiting of pregnancy, unspecified: Secondary | ICD-10-CM | POA: Diagnosis not present

## 2018-05-07 LAB — HCG, QUANTITATIVE, PREGNANCY: hCG, Beta Chain, Quant, S: 292 m[IU]/mL — ABNORMAL HIGH (ref <5)

## 2018-05-07 LAB — ABO/RH: ABO/RH(D): B POS

## 2018-05-07 LAB — I-STAT TROPONIN, ED: Troponin i, poc: 0 ng/mL (ref 0.00–0.08)

## 2018-05-07 LAB — HIV ANTIBODY (ROUTINE TESTING W REFLEX): HIV SCREEN 4TH GENERATION: NONREACTIVE

## 2018-05-07 MED ORDER — ACETAMINOPHEN 500 MG PO TABS
1000.0000 mg | ORAL_TABLET | Freq: Once | ORAL | Status: AC
  Administered 2018-05-07: 1000 mg via ORAL
  Filled 2018-05-07: qty 2

## 2018-05-07 MED ORDER — CEPHALEXIN 500 MG PO CAPS: 500.0000 mg | ORAL_CAPSULE | Freq: Three times a day (TID) | ORAL | 0 refills | Status: DC

## 2018-05-07 MED ORDER — PROMETHAZINE HCL 25 MG PO TABS: 12.5000 mg | ORAL_TABLET | Freq: Four times a day (QID) | ORAL | 0 refills | Status: DC | PRN
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
## 2018-05-07 NOTE — ED Notes (Signed)
Patient verbalizes understanding of discharge instructions. Opportunity for questioning and answers were provided. Armband removed by staff, pt discharged from ED.  

## 2018-05-07 NOTE — Progress Notes (Signed)
Lisa Leftwich-Kirby CNM in to discuss test results and d/c plan. Written and verbal d/c instructions given and understanding voiced. 
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
## 2018-05-08 LAB — CULTURE, OB URINE: Culture: NO GROWTH

## 2018-05-09 ENCOUNTER — Encounter: Payer: Self-pay | Admitting: Family Medicine

## 2018-05-09 ENCOUNTER — Ambulatory Visit (INDEPENDENT_AMBULATORY_CARE_PROVIDER_SITE_OTHER): Payer: Self-pay | Admitting: *Deleted

## 2018-05-09 DIAGNOSIS — O3680X0 Pregnancy with inconclusive fetal viability, not applicable or unspecified: Secondary | ICD-10-CM

## 2018-05-09 LAB — GC/CHLAMYDIA PROBE AMP (~~LOC~~) NOT AT ARMC
CHLAMYDIA, DNA PROBE: NEGATIVE
Neisseria Gonorrhea: NEGATIVE

## 2018-05-09 LAB — HCG, QUANTITATIVE, PREGNANCY: hCG, Beta Chain, Quant, S: 750 m[IU]/mL — ABNORMAL HIGH (ref <5)

## 2018-05-09 NOTE — Progress Notes (Signed)
Here for stat bhcg. States still having pain in lower back but less than when in mau- was 10 then and 5 now. Meda Klinefelter. Denis bleeding. Explained we will draw stat bhcg and have her wait in lobby for results which we will then discuss with provider and then her. She voices understanding.  Reviewed results with Dr. Debroah LoopArnold as Dr. Macon LargeAnyanwu not available. Informed patient appropriate rise in bhcg, appears to be early pregnancy- recommend US in 14 days. Come to office afterwards for results.  Patient requests 05/24/18 . appt scheduled. Ectopic precautions given, Eulah PontLinda Z,RN

## 2018-05-22 ENCOUNTER — Encounter (HOSPITAL_COMMUNITY): Payer: Self-pay | Admitting: *Deleted

## 2018-05-22 ENCOUNTER — Inpatient Hospital Stay (HOSPITAL_COMMUNITY)
Admission: AD | Admit: 2018-05-22 | Discharge: 2018-05-22 | Disposition: A | Discharge status: Home / Self Care | Payer: Medicaid Other | Source: Ambulatory Visit | Attending: Obstetrics & Gynecology | Admitting: Obstetrics & Gynecology

## 2018-05-22 DIAGNOSIS — K219 Gastro-esophageal reflux disease without esophagitis: Secondary | ICD-10-CM | POA: Insufficient documentation

## 2018-05-22 DIAGNOSIS — O210 Mild hyperemesis gravidarum: Secondary | ICD-10-CM | POA: Insufficient documentation

## 2018-05-22 DIAGNOSIS — O26891 Other specified pregnancy related conditions, first trimester: Secondary | ICD-10-CM

## 2018-05-22 DIAGNOSIS — O99611 Diseases of the digestive system complicating pregnancy, first trimester: Secondary | ICD-10-CM | POA: Diagnosis not present

## 2018-05-22 DIAGNOSIS — O219 Vomiting of pregnancy, unspecified: Secondary | ICD-10-CM | POA: Diagnosis not present

## 2018-05-22 DIAGNOSIS — R12 Heartburn: Secondary | ICD-10-CM

## 2018-05-22 DIAGNOSIS — Z3A09 9 weeks gestation of pregnancy: Secondary | ICD-10-CM | POA: Diagnosis not present

## 2018-05-22 LAB — URINALYSIS, ROUTINE W REFLEX MICROSCOPIC
Bilirubin Urine: NEGATIVE
Glucose, UA: NEGATIVE mg/dL
Hgb urine dipstick: NEGATIVE
Ketones, ur: 5 mg/dL — AB
Leukocytes, UA: NEGATIVE
NITRITE: NEGATIVE
PH: 6 (ref 5.0–8.0)
Protein, ur: NEGATIVE mg/dL
SPECIFIC GRAVITY, URINE: 1.031 — AB (ref 1.005–1.030)

## 2018-05-22 MED ORDER — PROMETHAZINE HCL 25 MG/ML IJ SOLN
25.0000 mg | Freq: Once | INTRAMUSCULAR | Status: AC
  Administered 2018-05-22: 25 mg via INTRAVENOUS
  Filled 2018-05-22: qty 1

## 2018-05-22 MED ORDER — LACTATED RINGERS IV BOLUS
1000.0000 mL | Freq: Once | INTRAVENOUS | Status: AC
  Administered 2018-05-22: 1000 mL via INTRAVENOUS

## 2018-05-22 MED ORDER — PROMETHAZINE HCL 25 MG PO TABS: 25.0000 mg | ORAL_TABLET | Freq: Four times a day (QID) | ORAL | 0 refills | Status: DC | PRN

## 2018-05-22 MED ORDER — FAMOTIDINE IN NACL 20-0.9 MG/50ML-% IV SOLN
20.0000 mg | Freq: Once | INTRAVENOUS | Status: AC
  Administered 2018-05-22: 20 mg via INTRAVENOUS
  Filled 2018-05-22: qty 50

## 2018-05-22 MED ORDER — FAMOTIDINE 20 MG PO TABS: 20.0000 mg | ORAL_TABLET | Freq: Two times a day (BID) | ORAL | 0 refills | Status: DC

## 2018-05-22 NOTE — MAU Provider Note (Signed)
Chief Complaint: Nausea and Emesis   First Provider Initiated Contact with Patient 05/22/18 2103     SUBJECTIVE HPI: Elizabeth Shaffer is a 22 y.o. G1P0000 at 5639w4d who presents to Maternity Admissions reporting n/v. Symptoms began 4 days ago. States she vomits whenever she eats. Was previously prescribed phenergan, but states she can't keep it down.  Denies abdominal pain, vaginal bleeding, or diarrhea. Last BM was earlier today.    Past Medical History:  Diagnosis Date  . GERD (gastroesophageal reflux disease)   . Headache    OB History  Gravida Para Term Preterm AB Living  1 0 0 0 0 0  SAB TAB Ectopic Multiple Live Births  0 0 0 0 0    # Outcome Date GA Lbr Len/2nd Weight Sex Delivery Anes PTL Lv  1 Current            Past Surgical History:  Procedure Laterality Date  . MOUTH SURGERY     Social History   Socioeconomic History  . Marital status: Single    Spouse name: Not on file  . Number of children: Not on file  . Years of education: Not on file  . Highest education level: Not on file  Occupational History  . Not on file  Social Needs  . Financial resource strain: Not on file  . Food insecurity:    Worry: Not on file    Inability: Not on file  . Transportation needs:    Medical: Not on file    Non-medical: Not on file  Tobacco Use  . Smoking status: Never Smoker  . Smokeless tobacco: Never Used  Substance and Sexual Activity  . Alcohol use: Yes    Comment: on occ  . Drug use: Yes    Types: Marijuana  . Sexual activity: Yes    Birth control/protection: None  Lifestyle  . Physical activity:    Days per week: Not on file    Minutes per session: Not on file  . Stress: Not on file  Relationships  . Social connections:    Talks on phone: Not on file    Gets together: Not on file    Attends religious service: Not on file    Active member of club or organization: Not on file    Attends meetings of clubs or organizations: Not on file    Relationship status:  Not on file  . Intimate partner violence:    Fear of current or ex partner: Not on file    Emotionally abused: Not on file    Physically abused: Not on file    Forced sexual activity: Not on file  Other Topics Concern  . Not on file  Social History Narrative  . Not on file   Family History  Problem Relation Age of Onset  . Hypertension Mother    No current facility-administered medications on file prior to encounter.    No current outpatient medications on file prior to encounter.   No Known Allergies  I have reviewed patient's Past Medical Hx, Surgical Hx, Family Hx, Social Hx, medications and allergies.   Review of Systems  Constitutional: Negative.   Gastrointestinal: Positive for nausea and vomiting. Negative for abdominal pain, constipation and diarrhea.  Genitourinary: Negative.     OBJECTIVE Patient Vitals for the past 24 hrs:  BP Temp Temp src Pulse Resp SpO2 Height Weight  05/22/18 2237 (!) 105/48 98.2 F (36.8 C) Oral 96 17 - - -  05/22/18 2033 120/78 98  F (36.7 C) Oral 81 18 100 % 5\' 1"  (1.549 m) 47.2 kg   Constitutional: Well-developed, well-nourished female in no acute distress.  Cardiovascular: normal rate & rhythm, no murmur Respiratory: normal rate and effort. Lung sounds clear throughout GI: Abd soft, non-tender, Pos BS x 4. No guarding or rebound tenderness MS: Extremities nontender, no edema, normal ROM Neurologic: Alert and oriented x 4.    LAB RESULTS Results for orders placed or performed during the hospital encounter of 05/22/18 (from the past 24 hour(s))  Urinalysis, Routine w reflex microscopic     Status: Abnormal   Collection Time: 05/22/18  8:44 PM  Result Value Ref Range   Color, Urine YELLOW YELLOW   APPearance HAZY (A) CLEAR   Specific Gravity, Urine 1.031 (H) 1.005 - 1.030   pH 6.0 5.0 - 8.0   Glucose, UA NEGATIVE NEGATIVE mg/dL   Hgb urine dipstick NEGATIVE NEGATIVE   Bilirubin Urine NEGATIVE NEGATIVE   Ketones, ur 5 (A)  NEGATIVE mg/dL   Protein, ur NEGATIVE NEGATIVE mg/dL   Nitrite NEGATIVE NEGATIVE   Leukocytes, UA NEGATIVE NEGATIVE    IMAGING No results found.  MAU COURSE Orders Placed This Encounter  Procedures  . Urinalysis, Routine w reflex microscopic  . Discharge patient   Meds ordered this encounter  Medications  . lactated ringers bolus 1,000 mL  . famotidine (PEPCID) IVPB 20 mg premix  . promethazine (PHENERGAN) injection 25 mg  . famotidine (PEPCID) 20 MG tablet    Sig: Take 1 tablet (20 mg total) by mouth 2 (two) times daily.    Dispense:  60 tablet    Refill:  0    Order Specific Question:   Supervising Provider    Answer:   Despina Hidden, LUTHER H [[id number]]  . promethazine (PHENERGAN) 25 MG tablet    Sig: Take 1 tablet (25 mg total) by mouth every 6 (six) hours as needed for nausea or vomiting.    Dispense:  30 tablet    Refill:  0    Order Specific Question:   Supervising Provider    Answer:   Duane Lope H [[id number]]    MDM IV fluids, phenergan, & pepcid given No vomiting in MAU. Able to tolerate PO fluids  ASSESSMENT 1. Nausea and vomiting during pregnancy prior to [redacted] weeks gestation   2. Heartburn during pregnancy in first trimester   3. [redacted] weeks gestation of pregnancy     PLAN Discharge home in stable condition. Rx phenergan & pepcid  Allergies as of 05/22/2018   No Known Allergies     Medication List    STOP taking these medications   cephALEXin 500 MG capsule Commonly known as:  KEFLEX   omeprazole 20 MG capsule Commonly known as:  PRILOSEC     TAKE these medications   famotidine 20 MG tablet Commonly known as:  PEPCID Take 1 tablet (20 mg total) by mouth 2 (two) times daily.   promethazine 25 MG tablet Commonly known as:  PHENERGAN Take 1 tablet (25 mg total) by mouth every 6 (six) hours as needed for nausea or vomiting. What changed:    how much to take  reasons to take this        Judeth Horn, NP 05/23/2018  12:46 AM

## 2018-05-22 NOTE — Discharge Instructions (Signed)

## 2018-05-22 NOTE — MAU Note (Signed)
Pt here with c/o nausea and vomiting for the last 3-4 days. Denies any bleeding.

## 2018-05-24 ENCOUNTER — Encounter: Payer: Self-pay | Admitting: Family Medicine

## 2018-05-24 ENCOUNTER — Ambulatory Visit (HOSPITAL_COMMUNITY)
Admission: RE | Admit: 2018-05-24 | Discharge: 2018-05-24 | Disposition: A | Discharge status: Home / Self Care | Payer: Medicaid Other | Source: Ambulatory Visit | Attending: Obstetrics & Gynecology | Admitting: Obstetrics & Gynecology

## 2018-05-24 ENCOUNTER — Ambulatory Visit (INDEPENDENT_AMBULATORY_CARE_PROVIDER_SITE_OTHER): Payer: Self-pay | Admitting: *Deleted

## 2018-05-24 DIAGNOSIS — Z3A01 Less than 8 weeks gestation of pregnancy: Secondary | ICD-10-CM | POA: Insufficient documentation

## 2018-05-24 DIAGNOSIS — O3680X0 Pregnancy with inconclusive fetal viability, not applicable or unspecified: Secondary | ICD-10-CM | POA: Diagnosis not present

## 2018-05-24 NOTE — Progress Notes (Signed)
Chart reviewed for nurse visit. Agree with plan of care.   Marylene LandKooistra, Kathryn Lorraine, CNM 05/24/2018 1:42 PM

## 2018-05-24 NOTE — Progress Notes (Signed)
Here for US results. Reviewed with Donnita FallsKathryn Kooistra,CNM as Dr. Earlene Plateravis unavailable. Informed Zorina US shows live baby at 6867w3d with EDD 01/14/19 and we recommend she start prenatal care. She voices understanding. Linda,RN

## 2018-05-29 ENCOUNTER — Inpatient Hospital Stay (HOSPITAL_COMMUNITY)
Admission: AD | Admit: 2018-05-29 | Discharge: 2018-05-29 | Disposition: A | Discharge status: Home / Self Care | Payer: Medicaid Other | Source: Ambulatory Visit | Attending: Obstetrics & Gynecology | Admitting: Obstetrics & Gynecology

## 2018-05-29 ENCOUNTER — Encounter (HOSPITAL_COMMUNITY): Payer: Self-pay | Admitting: *Deleted

## 2018-05-29 DIAGNOSIS — O219 Vomiting of pregnancy, unspecified: Secondary | ICD-10-CM

## 2018-05-29 DIAGNOSIS — O209 Hemorrhage in early pregnancy, unspecified: Secondary | ICD-10-CM | POA: Diagnosis not present

## 2018-05-29 DIAGNOSIS — O26851 Spotting complicating pregnancy, first trimester: Secondary | ICD-10-CM | POA: Diagnosis not present

## 2018-05-29 DIAGNOSIS — N939 Abnormal uterine and vaginal bleeding, unspecified: Secondary | ICD-10-CM | POA: Diagnosis present

## 2018-05-29 DIAGNOSIS — Z3A01 Less than 8 weeks gestation of pregnancy: Secondary | ICD-10-CM

## 2018-05-29 LAB — URINALYSIS, ROUTINE W REFLEX MICROSCOPIC
Bilirubin Urine: NEGATIVE
Glucose, UA: NEGATIVE mg/dL
Hgb urine dipstick: NEGATIVE
Ketones, ur: 15 mg/dL — AB
Leukocytes, UA: NEGATIVE
Nitrite: NEGATIVE
Protein, ur: NEGATIVE mg/dL
Specific Gravity, Urine: >1.03 — ABNORMAL HIGH (ref 1.005–1.030)
pH: 6 (ref 5.0–8.0)

## 2018-05-29 MED ORDER — PROMETHAZINE HCL 25 MG PO TABS: 25.0000 mg | ORAL_TABLET | Freq: Four times a day (QID) | ORAL | 2 refills | Status: DC | PRN

## 2018-05-29 MED ORDER — PROMETHAZINE HCL 25 MG/ML IJ SOLN
25.0000 mg | Freq: Once | INTRAMUSCULAR | Status: AC
  Administered 2018-05-29: 25 mg via INTRAMUSCULAR
  Filled 2018-05-29: qty 1

## 2018-05-29 MED ORDER — ONDANSETRON 8 MG PO TBDP: 8.0000 mg | ORAL_TABLET | Freq: Three times a day (TID) | ORAL | 2 refills | Status: DC | PRN

## 2018-05-29 NOTE — MAU Note (Signed)
Spotting since yesterday, was dark yesterday and today is more light pink  Having some lower back pain, since yesterday, 3/10, dull ache, constant  Having vomiting, emesis 3-4x in the last 24 hours  Also been feeling dizzy and weak today.

## 2018-05-29 NOTE — Discharge Instructions (Signed)
Vaginal Bleeding During Pregnancy, First Trimester °A small amount of bleeding (spotting) from the vagina is common in early pregnancy. Sometimes the bleeding is normal and is not a problem, and sometimes it is a sign of something serious. Be sure to tell your doctor about any bleeding from your vagina right away. °Follow these instructions at home: °· Watch your condition for any changes. °· Follow your doctor's instructions about how active you can be. °· If you are on bed rest: °? You may need to stay in bed and only get up to use the bathroom. °? You may be allowed to do some activities. °? If you need help, make plans for someone to help you. °· Write down: °? The number of pads you use each day. °? How often you change pads. °? How soaked (saturated) your pads are. °· Do not use tampons. °· Do not douche. °· Do not have sex or orgasms until your doctor says it is okay. °· If you pass any tissue from your vagina, save the tissue so you can show it to your doctor. °· Only take medicines as told by your doctor. °· Do not take aspirin because it can make you bleed. °· Keep all follow-up visits as told by your doctor. °Contact a doctor if: °· You bleed from your vagina. °· You have cramps. °· You have labor pains. °· You have a fever that does not go away after you take medicine. °Get help right away if: °· You have very bad cramps in your back or belly (abdomen). °· You pass large clots or tissue from your vagina. °· You bleed more. °· You feel light-headed or weak. °· You pass out (faint). °· You have chills. °· You are leaking fluid or have a gush of fluid from your vagina. °· You pass out while pooping (having a bowel movement). °This information is not intended to replace advice given to you by your health care provider. Make sure you discuss any questions you have with your health care provider. °Document Released: 10/16/2013 Document Revised: 11/07/2015 Document Reviewed: 02/06/2013 °Elsevier Interactive  Patient Education © 2018 [employer] ° °

## 2018-05-29 NOTE — MAU Provider Note (Signed)
History    First Provider Initiated SolicitorContact with Patient 05/29/18 1340     Chief Complaint:  Vaginal Bleeding; Back Pain; Emesis; and Dizziness   Elizabeth Shaffer is  22 y.o. G1P0000 Patient's last menstrual period was 03/16/2018.Marland Kitchen. Patient presents to MAU for spotting, mild low back pain, N/V x 4 over the past day and feeling week.  She is 3255w1d weeks gestation  by early ultrasound on 05/24/18 that showed 6.3 week live IUP.    Associated Sx: Neg for fever, chills, passage of tissue or clots, urinary complaints, diarrhea or constipation,   ROS Abdominal Pain: None Vaginal bleeding: spotting.   Passage of clots or tissue: Denies  Dizziness: Mild   B POS Performed at [facility], [address] [place], [place] [id number]    Physical Exam   Patient Vitals for the past 24 hrs:  BP Temp Temp src Pulse Resp Height Weight  05/29/18 1251 106/65 97.7 F (36.5 C) Oral 86 18 5\' 1"  (1.549 m) -  05/29/18 1244 - - - - - - 47.1 kg   Constitutional: Well-nourished female in no apparent distress. No pallor Neuro: Alert and oriented 4 Cardiovascular: Normal rate Respiratory: Normal effort and rate Abdomen: Soft, nontender Gynecological Exam: normal external genitalia, vulva, vagina, cervix, uterus and adnexa. Scant pink blood in vault.  Labs: Results for orders placed or performed during the hospital encounter of 05/29/18 (from the past 24 hour(s))  Urinalysis, Routine w reflex microscopic   Collection Time: 05/29/18  1:00 PM  Result Value Ref Range   Color, Urine YELLOW YELLOW   APPearance CLEAR CLEAR   Specific Gravity, Urine >1.030 (H) 1.005 - 1.030   pH 6.0 5.0 - 8.0   Glucose, UA NEGATIVE NEGATIVE mg/dL   Hgb urine dipstick NEGATIVE NEGATIVE   Bilirubin Urine NEGATIVE NEGATIVE   Ketones, ur 15 (A) NEGATIVE mg/dL   Protein, ur NEGATIVE NEGATIVE mg/dL   Nitrite NEGATIVE NEGATIVE   Leukocytes, UA NEGATIVE NEGATIVE    Ultrasound Studies:   Informal BS US US shows SIUP. +  FHR   MAU course/MDM: Orders Placed This Encounter  Procedures  . Urinalysis, Routine w reflex microscopic  . Discharge patient   Meds ordered this encounter  Medications  . promethazine (PHENERGAN) injection 25 mg  . promethazine (PHENERGAN) 25 MG tablet    Sig: Take 1 tablet (25 mg total) by mouth every 6 (six) hours as needed for nausea or vomiting.    Dispense:  30 tablet    Refill:  2    Order Specific Question:   Supervising Provider    Answer:   Willodean RosenthalHARRAWAY-Alison Kubicki, CAROLYN [id number]]  . ondansetron (ZOFRAN ODT) 8 MG disintegrating tablet    Sig: Take 1 tablet (8 mg total) by mouth every 8 (eight) hours as needed for nausea or vomiting.    Dispense:  20 tablet    Refill:  2    Order Specific Question:   Supervising Provider    Answer:   Willodean RosenthalHARRAWAY-Bettyanne Dittman, CAROLYN [phone][[id number]]    - Spotting in pregnancy w/ previously confirmed IUP and +cardiac activity today. Unclear etiology of spotting. - Nausea and vomiting improved w/ Phenergan, but pt requests Zofran Rx. Discussed some studies showing increased risk of birth defects. Pt verbalized understanding and would like to have it in case of exacerbation not controlled w/ phenergan.   Assessment: 1. Spotting affecting pregnancy in first trimester   2. Nausea/vomiting in pregnancy      Plan: Discharge home in stable condition. Bleeding precautions  Follow-up Information    [facility] Follow up on 07/19/2018.   Specialty:  Obstetrics and Gynecology Why:  as scheduled for NOB or sooner as needed if symptoms worsen Contact information: [address] [place] [address] [phone]       [facility] Follow up.   Specialty:  Obstetrics and Gynecology Why:  as needed in pregnancy emergencies Contact information: [address] [phone] mc [place] [address] [phone]         Allergies as of 05/29/2018   No Known Allergies     Medication  List    TAKE these medications   famotidine 20 MG tablet Commonly known as:  PEPCID Take 1 tablet (20 mg total) by mouth 2 (two) times daily.   ondansetron 8 MG disintegrating tablet Commonly known as:  ZOFRAN ODT Take 1 tablet (8 mg total) by mouth every 8 (eight) hours as needed for nausea or vomiting.   promethazine 25 MG tablet Commonly known as:  PHENERGAN Take 1 tablet (25 mg total) by mouth every 6 (six) hours as needed for nausea or vomiting.       Katrinka Blazing, [place], CNM 05/29/2018, 3:51 PM  2/3

## 2018-06-06 ENCOUNTER — Encounter (HOSPITAL_COMMUNITY): Payer: Self-pay

## 2018-06-06 ENCOUNTER — Other Ambulatory Visit: Payer: Self-pay

## 2018-06-06 ENCOUNTER — Emergency Department (HOSPITAL_COMMUNITY)
Admission: EM | Admit: 2018-06-06 | Discharge: 2018-06-07 | Disposition: A | Discharge status: Home / Self Care | Payer: 59 | Attending: Emergency Medicine | Admitting: Emergency Medicine

## 2018-06-06 DIAGNOSIS — J069 Acute upper respiratory infection, unspecified: Secondary | ICD-10-CM

## 2018-06-06 DIAGNOSIS — O2311 Infections of bladder in pregnancy, first trimester: Secondary | ICD-10-CM | POA: Insufficient documentation

## 2018-06-06 DIAGNOSIS — O2341 Unspecified infection of urinary tract in pregnancy, first trimester: Secondary | ICD-10-CM

## 2018-06-06 DIAGNOSIS — R109 Unspecified abdominal pain: Secondary | ICD-10-CM | POA: Diagnosis not present

## 2018-06-06 DIAGNOSIS — R110 Nausea: Secondary | ICD-10-CM | POA: Insufficient documentation

## 2018-06-06 DIAGNOSIS — R0981 Nasal congestion: Secondary | ICD-10-CM | POA: Diagnosis present

## 2018-06-06 DIAGNOSIS — Z3A08 8 weeks gestation of pregnancy: Secondary | ICD-10-CM | POA: Diagnosis not present

## 2018-06-06 DIAGNOSIS — Z3490 Encounter for supervision of normal pregnancy, unspecified, unspecified trimester: Secondary | ICD-10-CM

## 2018-06-06 LAB — URINALYSIS, ROUTINE W REFLEX MICROSCOPIC
Bilirubin Urine: NEGATIVE
Glucose, UA: NEGATIVE mg/dL
Hgb urine dipstick: NEGATIVE
Ketones, ur: 20 mg/dL — AB
Nitrite: NEGATIVE
PH: 5 (ref 5.0–8.0)
Protein, ur: NEGATIVE mg/dL
SPECIFIC GRAVITY, URINE: 1.026 (ref 1.005–1.030)

## 2018-06-06 LAB — COMPREHENSIVE METABOLIC PANEL
ALK PHOS: 47 U/L (ref 38–126)
ALT: 39 U/L (ref 0–44)
AST: 34 U/L (ref 15–41)
Albumin: 4.1 g/dL (ref 3.5–5.0)
Anion gap: 10 (ref 5–15)
BUN: 7 mg/dL (ref 6–20)
CO2: 24 mmol/L (ref 22–32)
Calcium: 9.5 mg/dL (ref 8.9–10.3)
Chloride: 103 mmol/L (ref 98–111)
Creatinine, Ser: 0.69 mg/dL (ref 0.44–1.00)
GFR calc Af Amer: >60 mL/min (ref >60)
GFR calc non Af Amer: >60 mL/min (ref >60)
Glucose, Bld: 90 mg/dL (ref 70–99)
Potassium: 4.1 mmol/L (ref 3.5–5.1)
Sodium: 137 mmol/L (ref 135–145)
Total Bilirubin: 0.8 mg/dL (ref 0.3–1.2)
Total Protein: 7.4 g/dL (ref 6.5–8.1)

## 2018-06-06 LAB — CBC
HCT: 37.6 % (ref 36.0–46.0)
Hemoglobin: 12.7 g/dL (ref 12.0–15.0)
MCH: 31.4 pg (ref 26.0–34.0)
MCHC: 33.8 g/dL (ref 30.0–36.0)
MCV: 92.8 fL (ref 80.0–100.0)
Platelets: 200 10*3/uL (ref 150–400)
RBC: 4.05 MIL/uL (ref 3.87–5.11)
RDW: 11.9 % (ref 11.5–15.5)
WBC: 7.6 10*3/uL (ref 4.0–10.5)
nRBC: 0 % (ref 0.0–0.2)

## 2018-06-06 LAB — HCG, QUANTITATIVE, PREGNANCY: hCG, Beta Chain, Quant, S: 285625 m[IU]/mL — ABNORMAL HIGH (ref <5)

## 2018-06-06 LAB — LIPASE, BLOOD: Lipase: 32 U/L (ref 11–51)

## 2018-06-06 MED ORDER — ONDANSETRON HCL 4 MG/2ML IJ SOLN
4.0000 mg | Freq: Once | INTRAMUSCULAR | Status: AC
  Administered 2018-06-06: 4 mg via INTRAVENOUS
  Filled 2018-06-06: qty 2

## 2018-06-06 MED ORDER — OXYMETAZOLINE HCL 0.05 % NA SOLN
1.0000 | Freq: Once | NASAL | Status: AC
  Administered 2018-06-06: 1 via NASAL
  Filled 2018-06-06: qty 15

## 2018-06-06 MED ORDER — NITROFURANTOIN MONOHYD MACRO 100 MG PO CAPS
100.0000 mg | ORAL_CAPSULE | Freq: Once | ORAL | Status: AC
  Administered 2018-06-06: 100 mg via ORAL
  Filled 2018-06-06: qty 1

## 2018-06-06 MED ORDER — NITROFURANTOIN MONOHYD MACRO 100 MG PO CAPS: 100.0000 mg | ORAL_CAPSULE | Freq: Two times a day (BID) | ORAL | 0 refills | Status: DC

## 2018-06-06 MED ORDER — ACETAMINOPHEN 500 MG PO TABS
1000.0000 mg | ORAL_TABLET | Freq: Once | ORAL | Status: AC
  Administered 2018-06-06: 1000 mg via ORAL
  Filled 2018-06-06: qty 2

## 2018-06-06 MED ORDER — DIPHENHYDRAMINE HCL 25 MG PO CAPS
25.0000 mg | ORAL_CAPSULE | Freq: Once | ORAL | Status: AC
  Administered 2018-06-06: 25 mg via ORAL
  Filled 2018-06-06: qty 1

## 2018-06-06 NOTE — ED Notes (Signed)
Please note: in addition to CBC/CMP/Lipase/hCG a dark green top was also collected and put on rocker in mini lab.

## 2018-06-06 NOTE — ED Provider Notes (Addendum)
[facility] EMERGENCY DEPARTMENT Provider Note   CSN: [id number] Arrival date & time: 06/06/18  1805     History   Chief Complaint Chief Complaint  Patient presents with  . Sore Throat  . Nasal Congestion  . Emesis    HPI Elizabeth Shaffer is a 22 y.o. female.  Pt presents to the ED today with sinus congestion, nausea, abd pain for the past few days.  She has not had a fever.  She is pregnant and has had an [place] on 12/10 which showed:  IMPRESSION: Six week 3 day intrauterine pregnancy. Fetal heart rate 124 beats per minute. No acute maternal findings.     Past Medical History:  Diagnosis Date  . GERD (gastroesophageal reflux disease)   . Headache     There are no active problems to display for this patient.   Past Surgical History:  Procedure Laterality Date  . MOUTH SURGERY       OB History    Gravida  1   Para  0   Term  0   Preterm  0   AB  0   Living  0     SAB  0   TAB  0   Ectopic  0   Multiple  0   Live Births  0            Home Medications    Prior to Admission medications   Medication Sig Start Date End Date Taking? Authorizing Provider  famotidine (PEPCID) 20 MG tablet Take 1 tablet (20 mg total) by mouth 2 (two) times daily. 05/22/18   Judeth Horn, NP  nitrofurantoin, macrocrystal-monohydrate, (MACROBID) 100 MG capsule Take 1 capsule (100 mg total) by mouth 2 (two) times daily. 06/06/18   Jacalyn Lefevre, MD  ondansetron (ZOFRAN ODT) 8 MG disintegrating tablet Take 1 tablet (8 mg total) by mouth every 8 (eight) hours as needed for nausea or vomiting. 05/29/18   Katrinka Blazing, [place], CNM  promethazine (PHENERGAN) 25 MG tablet Take 1 tablet (25 mg total) by mouth every 6 (six) hours as needed for nausea or vomiting. 05/29/18   Dorathy Kinsman, CNM    Family History Family History  Problem Relation Age of Onset  . Hypertension Mother     Social History Social History   Tobacco Use  . Smoking status: Never  Smoker  . Smokeless tobacco: Never Used  Substance Use Topics  . Alcohol use: Yes    Comment: on occ  . Drug use: Yes    Types: Marijuana     Allergies   Patient has no known allergies.   Review of Systems Review of Systems  HENT: Positive for congestion.   Respiratory: Positive for cough.   Gastrointestinal: Positive for abdominal pain and nausea.  All other systems reviewed and are negative.    Physical Exam Updated Vital Signs BP 116/83 (BP Location: Left Arm)   Pulse 97   Temp 98.7 F (37.1 C) (Oral)   Resp 18   LMP 03/16/2018   SpO2 100%   Physical Exam Vitals signs and nursing note reviewed.  Constitutional:      Appearance: She is well-developed and normal weight.  HENT:     Head: Normocephalic and atraumatic.     Right Ear: Ear canal normal.     Left Ear: Ear canal normal.     Nose: Congestion present.     Mouth/Throat:     Mouth: Mucous membranes are moist.  Eyes:  Conjunctiva/sclera: Conjunctivae normal.     Pupils: Pupils are equal, round, and reactive to light.  Neck:     Musculoskeletal: Normal range of motion and neck supple.  Cardiovascular:     Rate and Rhythm: Normal rate and regular rhythm.     Heart sounds: Normal heart sounds.  Pulmonary:     Effort: Pulmonary effort is normal.     Breath sounds: Normal breath sounds.  Abdominal:     General: Bowel sounds are normal.     Palpations: Abdomen is soft.  Skin:    General: Skin is warm.     Capillary Refill: Capillary refill takes less than 2 seconds.  Neurological:     General: No focal deficit present.     Mental Status: She is alert and oriented to person, place, and time.  Psychiatric:        Mood and Affect: Mood normal.        Behavior: Behavior normal.      ED Treatments / Results  Labs (all labs ordered are listed, but only abnormal results are displayed) Labs Reviewed  URINALYSIS, ROUTINE W REFLEX MICROSCOPIC - Abnormal; Notable for the following components:       Result Value   APPearance HAZY (*)    Ketones, ur 20 (*)    Leukocytes, UA SMALL (*)    Bacteria, UA MANY (*)    All other components within normal limits  HCG, QUANTITATIVE, PREGNANCY - Abnormal; Notable for the following components:   hCG, Beta Chain, Quant, S [id number],[id number] (*)    All other components within normal limits  URINE CULTURE  LIPASE, BLOOD  COMPREHENSIVE METABOLIC PANEL  CBC  INFLUENZA PANEL BY PCR (TYPE A & B)    EKG None  Radiology No results found.  Procedures Ultrasound ED OB Pelvic Date/Time: 06/07/2018 12:20 AM Performed by: Jacalyn LefevreHaviland, Davaun Quintela, MD Authorized by: Jacalyn LefevreHaviland, Isamu Trammel, MD   Procedure details:    Indications: evaluate for IUP and pregnant with abdominal pain     Assess:  Intrauterine pregnancy   Technique:  Transabdominal obstetric (HCG+) exam   Images: archived    Uterine findings:    Intrauterine pregnancy: identified     Single gestation: identified     Fetal heart rate: identified     Estimated gestational age: 47 weeks   (including critical care time)  Medications Ordered in ED Medications  diphenhydrAMINE (BENADRYL) capsule 25 mg (25 mg Oral Given 06/06/18 2310)  oxymetazoline (AFRIN) 0.05 % nasal spray 1 spray (1 spray Each Nare Given 06/06/18 2310)  ondansetron (ZOFRAN) injection 4 mg (4 mg Intravenous Given 06/06/18 2348)  acetaminophen (TYLENOL) tablet 1,000 mg (1,000 mg Oral Given 06/06/18 2348)  nitrofurantoin (macrocrystal-monohydrate) (MACROBID) capsule 100 mg (100 mg Oral Given 06/06/18 2348)     Initial Impression / Assessment and Plan / ED Course  I have reviewed the triage vital signs and the nursing notes.  Pertinent labs & imaging results that were available during my care of the patient were reviewed by me and considered in my medical decision making (see chart for details).    She has a UTI, so she Elizabeth be started on macrobid.  Urine sent for culture.  She also has a URI.  Flu negative.  Bedside us done to evaluate  FHTs.    Pt is stable for d/c.  Return if worse.  Final Clinical Impressions(s) / ED Diagnoses   Final diagnoses:  UTI (urinary tract infection) during pregnancy, first trimester  Viral upper respiratory  tract infection  Early stage of pregnancy    ED Discharge Orders         Ordered    nitrofurantoin, macrocrystal-monohydrate, (MACROBID) 100 MG capsule  2 times daily     06/06/18 2333           Jacalyn LefevreHaviland, Ernisha Sorn, MD 06/07/18 Aretha Parrot0021    Jacalyn LefevreHaviland, Mancil Pfenning, MD 06/07/18 [phone]

## 2018-06-06 NOTE — ED Triage Notes (Signed)
Pt here with nasal congestion and sore throat for the last 2 days.  Also now having nausea and emesis that began today. Pt is pregnant with due date August 1st 2020.  G1 P0. A&Ox4

## 2018-06-06 NOTE — Discharge Instructions (Addendum)
Ok to take tylenol and benadryl. Take zofran or phenergan for nausea.

## 2018-06-07 LAB — INFLUENZA PANEL BY PCR (TYPE A & B)
Influenza A By PCR: NEGATIVE
Influenza B By PCR: NEGATIVE

## 2018-06-09 LAB — URINE CULTURE: Culture: >=100000 — AB

## 2018-06-15 NOTE — L&D Delivery Note (Addendum)
Delivery Note Pt pushed for and hour, rested for 30 minutes, then pushed again for another hour. At 11:47 PM a viable female was delivered via  (Presentation: Vertex; LOA).  APGAR: 8, 9; weight see delivery summary.   Placenta status: Spontaneous, intact.  Cord: 3 vessel with the following complications: none.    Anesthesia: Epidural Episiotomy: None Lacerations: None Suture Repair: N/A Est. Blood Loss (mL): 93   Mom to postpartum.  Baby to Couplet care / Skin to Skin.  Gerlene Fee, DO Family Medicine Resident, PGY-1 01/10/2019, 12:06 AM  The above was performed by Dr. Cora Collum under my direct supervision and guidance.

## 2018-07-19 ENCOUNTER — Other Ambulatory Visit (HOSPITAL_COMMUNITY)
Admission: RE | Admit: 2018-07-19 | Discharge: 2018-07-19 | Disposition: A | Discharge status: Home / Self Care | Payer: Medicaid Other | Source: Ambulatory Visit | Attending: Obstetrics & Gynecology | Admitting: Obstetrics & Gynecology

## 2018-07-19 ENCOUNTER — Ambulatory Visit (INDEPENDENT_AMBULATORY_CARE_PROVIDER_SITE_OTHER): Payer: Medicaid Other | Admitting: *Deleted

## 2018-07-19 ENCOUNTER — Ambulatory Visit: Payer: Medicaid Other | Admitting: Clinical

## 2018-07-19 ENCOUNTER — Other Ambulatory Visit: Payer: Self-pay

## 2018-07-19 ENCOUNTER — Encounter: Payer: Self-pay | Admitting: *Deleted

## 2018-07-19 VITALS — BP 110/61 | HR 72 | Wt 110.6 lb

## 2018-07-19 DIAGNOSIS — Z3400 Encounter for supervision of normal first pregnancy, unspecified trimester: Secondary | ICD-10-CM | POA: Insufficient documentation

## 2018-07-19 MED ORDER — PRENATAL VITAMINS 0.8 MG PO TABS: 1.0000 | ORAL_TABLET | Freq: Every day | ORAL | 12 refills | Status: DC

## 2018-07-19 MED ORDER — DOCUSATE SODIUM 100 MG PO CAPS: 100.0000 mg | ORAL_CAPSULE | Freq: Two times a day (BID) | ORAL | 0 refills | Status: DC

## 2018-07-19 NOTE — BH Specialist Note (Signed)
Integrated Behavioral Health Initial Visit  MRN: [id number] Name: Elizabeth Shaffer  Number of Integrated Behavioral Health Clinician visits:: 1/6 Session Start time: 9:24  Session End time: 9:39 Total time: 15 minutes  Type of Service: Integrated Behavioral Health- Individual/Family Interpretor:No. Interpretor Name and Language: n/a   Warm Hand Off Completed.       SUBJECTIVE: Elizabeth Shaffer is a 23 y.o. female accompanied by n/a Patient was referred by [place] Muta Day, RN for .inialob . Patient reports the following symptoms/concerns: Pt states her primary concern today is wanting to prevent postpartum depression, as experienced by her sister; pt has no hx of depression. No other concerns at this time.  Duration of problem: Current pregnancy; Severity of problem: mild  OBJECTIVE: Mood: Anxious and Affect: Appropriate Risk of harm to self or others: No plan to harm self or others  LIFE CONTEXT: Family and Social: Pt's family and friends are supportive School/Work: Pt works full-time Self-Care: - Life Changes: Current first pregnancy  GOALS ADDRESSED: Patient will: 1. Reduce symptoms of: anxiety 2. Increase knowledge and/or ability of: healthy habits  3. Demonstrate ability to: Increase healthy adjustment to current life circumstances  INTERVENTIONS: Interventions utilized: Supportive Counseling and Psychoeducation and/or Health Education  Standardized Assessments completed: GAD-7 and PHQ 9  ASSESSMENT: Patient currently experiencing Supervision of low risk first pregnancy, antepartum   Patient may benefit from Initial OB introduction to integrated behavioral health services.  PLAN: 1. Follow up with behavioral health clinician on : As needed 2. Behavioral recommendations:  -Continue taking prenatal vitamin, as recommended by medical provider -Read educational materials regarding preventing and coping with symptoms of depresssion and anxiety; share with sister -Consider [employer]  Healthy Moms, Healthy Babies Program (call for information) 3. Referral(s): Integrated Art gallery manager (In Clinic) and [employer]:  [employer] 4. "From scale of 1-10, how likely are you to follow plan?": 10  Rae Lips, LCSW  Depression screen [facility] 2/9 07/19/2018  Decreased Interest 1  Down, Depressed, Hopeless 0  PHQ - 2 Score 1  Altered sleeping 2  Tired, decreased energy 1  Change in appetite 1  Feeling bad or failure about yourself  0  Trouble concentrating 0  Moving slowly or fidgety/restless 0  Suicidal thoughts 0  PHQ-9 Score 5   GAD 7 : Generalized Anxiety Score 07/19/2018  Nervous, Anxious, on Edge 2  Control/stop worrying 0  Worry too much - different things 1  Trouble relaxing 0  Restless 0  Afraid - awful might happen 0

## 2018-07-19 NOTE — Progress Notes (Signed)
New Ob intake completed and pregnancy information packet given. Medicaid Home form completed, [place] for anatomy scheduled and prenatal labs drawn. Pt offered no c/o. Initial prenatal visit w/provider scheduled on 2/18.

## 2018-07-20 DIAGNOSIS — K219 Gastro-esophageal reflux disease without esophagitis: Secondary | ICD-10-CM | POA: Insufficient documentation

## 2018-07-20 DIAGNOSIS — Z3400 Encounter for supervision of normal first pregnancy, unspecified trimester: Secondary | ICD-10-CM | POA: Insufficient documentation

## 2018-07-20 LAB — GC/CHLAMYDIA PROBE AMP (~~LOC~~) NOT AT ARMC
Chlamydia: NEGATIVE
NEISSERIA GONORRHEA: NEGATIVE

## 2018-07-21 LAB — CULTURE, OB URINE

## 2018-07-21 LAB — URINE CULTURE, OB REFLEX

## 2018-07-25 NOTE — Progress Notes (Signed)
I have reviewed this chart and agree with the RN/CMA assessment and management.    Maley Venezia C Taaj Hurlbut, MD, FACOG Attending Physician, [facility]   

## 2018-07-28 LAB — HEMOGLOBINOPATHY EVALUATION
Ferritin: 58 ng/mL (ref 15–150)
Hgb A2 Quant: 2.7 % (ref 1.8–3.2)
Hgb A: 75.6 % — ABNORMAL LOW (ref 96.4–98.8)
Hgb C: 0 %
Hgb F Quant: 0 % (ref 0.0–2.0)
Hgb S: 0 %
Hgb Solubility: NEGATIVE
Hgb Variant: 21.7 % — ABNORMAL HIGH

## 2018-07-28 LAB — OBSTETRIC PANEL, INCLUDING HIV
Antibody Screen: NEGATIVE
Basophils Absolute: 0 10*3/uL (ref 0.0–0.2)
Basos: 0 %
EOS (ABSOLUTE): 0 10*3/uL (ref 0.0–0.4)
Eos: 0 %
HIV Screen 4th Generation wRfx: NONREACTIVE
Hematocrit: 32.4 % — ABNORMAL LOW (ref 34.0–46.6)
Hemoglobin: 10.9 g/dL — ABNORMAL LOW (ref 11.1–15.9)
Hepatitis B Surface Ag: NEGATIVE
Immature Grans (Abs): 0 10*3/uL (ref 0.0–0.1)
Immature Granulocytes: 0 %
Lymphocytes Absolute: 2 10*3/uL (ref 0.7–3.1)
Lymphs: 32 %
MCH: 32.2 pg (ref 26.6–33.0)
MCHC: 33.6 g/dL (ref 31.5–35.7)
MCV: 96 fL (ref 79–97)
Monocytes Absolute: 0.5 10*3/uL (ref 0.1–0.9)
Monocytes: 8 %
Neutrophils Absolute: 3.7 10*3/uL (ref 1.4–7.0)
Neutrophils: 60 %
PLATELETS: 203 10*3/uL (ref 150–450)
RBC: 3.38 x10E6/uL — ABNORMAL LOW (ref 3.77–5.28)
RDW: 13.2 % (ref 11.7–15.4)
RPR Ser Ql: NONREACTIVE
Rh Factor: POSITIVE
Rubella Antibodies, IGG: 3.41 index (ref >0.99)
WBC: 6.2 10*3/uL (ref 3.4–10.8)

## 2018-07-28 LAB — INHERITEST(R) CF/SMA PANEL

## 2018-08-02 ENCOUNTER — Other Ambulatory Visit: Payer: Self-pay

## 2018-08-02 ENCOUNTER — Ambulatory Visit (INDEPENDENT_AMBULATORY_CARE_PROVIDER_SITE_OTHER): Payer: Medicaid Other

## 2018-08-02 ENCOUNTER — Other Ambulatory Visit (HOSPITAL_COMMUNITY)
Admission: RE | Admit: 2018-08-02 | Discharge: 2018-08-02 | Disposition: A | Discharge status: Home / Self Care | Payer: Medicaid Other | Source: Ambulatory Visit | Attending: Obstetrics & Gynecology | Admitting: Obstetrics & Gynecology

## 2018-08-02 VITALS — BP 103/65 | HR 70 | Wt 113.2 lb

## 2018-08-02 DIAGNOSIS — Z3400 Encounter for supervision of normal first pregnancy, unspecified trimester: Secondary | ICD-10-CM

## 2018-08-02 DIAGNOSIS — Z3402 Encounter for supervision of normal first pregnancy, second trimester: Secondary | ICD-10-CM

## 2018-08-02 DIAGNOSIS — Z23 Encounter for immunization: Secondary | ICD-10-CM

## 2018-08-02 DIAGNOSIS — Z3A16 16 weeks gestation of pregnancy: Secondary | ICD-10-CM

## 2018-08-02 LAB — POCT URINALYSIS DIP (DEVICE)
Bilirubin Urine: NEGATIVE
Glucose, UA: NEGATIVE mg/dL
Hgb urine dipstick: NEGATIVE
Ketones, ur: NEGATIVE mg/dL
Leukocytes,Ua: NEGATIVE
Nitrite: NEGATIVE
Protein, ur: 30 mg/dL — AB
Specific Gravity, Urine: >=1.03 (ref 1.005–1.030)
UROBILINOGEN UA: 0.2 mg/dL (ref 0.0–1.0)
pH: 6.5 (ref 5.0–8.0)

## 2018-08-02 NOTE — Progress Notes (Signed)
Subjective:   Elizabeth Shaffer is a 23 y.o. G1P0000 at [redacted]w[redacted]d by early ultrasound being seen today for her first obstetrical visit. She has no obstetrical history and her medical history is significant for GERD and Headaches . Patient does intend to breast feed. Pregnancy history fully reviewed.  Patient reports abdominal cramping that is intermittent and lasts seconds when it occurs.  She reports it is relieved with position change.  She also endorses constipation, but had a bowel movement yesterday that was easy to pass.  Patient denies vaginal concerns including bleeding, leaking, odor, or itching.  She also denies NV and reports some perception of flutters and fetal "kicking."    HISTORY: OB History  Gravida Para Term Preterm AB Living  1 0 0 0 0 0  SAB TAB Ectopic Multiple Live Births  0 0 0 0 0    # Outcome Date GA Lbr Len/2nd Weight Sex Delivery Anes PTL Lv  1 Current             Pap was obtained today.  Past Medical History:  Diagnosis Date  . GERD (gastroesophageal reflux disease)   . Headache    Past Surgical History:  Procedure Laterality Date  . MOUTH SURGERY    . MOUTH SURGERY  2011  . MULTIPLE TOOTH EXTRACTIONS    . NO PAST SURGERIES    . WISDOM TOOTH EXTRACTION     Family History  Problem Relation Age of Onset  . Hypertension Mother    Social History   Tobacco Use  . Smoking status: Never Smoker  . Smokeless tobacco: Never Used  Substance Use Topics  . Alcohol use: Not Currently    Comment: on occ  . Drug use: Yes    Types: Marijuana    Comment: none since November 2019   No Known Allergies Current Outpatient Medications on File Prior to Visit  Medication Sig Dispense Refill  . ondansetron (ZOFRAN ODT) 8 MG disintegrating tablet Take 1 tablet (8 mg total) by mouth every 8 (eight) hours as needed for nausea or vomiting. 20 tablet 2  . Prenatal Multivit-Min-Fe-FA (PRENATAL VITAMINS) 0.8 MG tablet Take 1 tablet by mouth daily. 30 tablet 12  . Prenatal  Vit-Fe Fumarate-FA (MULTIVITAMIN-PRENATAL) 27-0.8 MG TABS tablet Take 1 tablet by mouth daily at 12 noon.    . promethazine (PHENERGAN) 25 MG tablet Take 1 tablet (25 mg total) by mouth every 6 (six) hours as needed for nausea or vomiting. 30 tablet 2  . docusate sodium (COLACE) 100 MG capsule Take 1 capsule (100 mg total) by mouth 2 (two) times daily. (Patient not taking: Reported on 08/02/2018) 10 capsule 0  . famotidine (PEPCID) 20 MG tablet Take 1 tablet (20 mg total) by mouth 2 (two) times daily. (Patient not taking: Reported on 07/19/2018) 60 tablet 0   No current facility-administered medications on file prior to visit.     Review of Systems Pertinent items noted in HPI and remainder of comprehensive ROS otherwise negative.  Exam   Vitals:   08/02/18 0845  BP: 103/65  Pulse: 70  Weight: 113 lb 3.2 oz (51.3 kg)   Fetal Heart Rate (bpm): 148  Uterus:     Pelvic Exam: Perineum: No hemorrhoids, Normal perineum   Vulva: Normal external genitalia, no lesions   Vagina:  Pink mucosa, Moderate amt milky white curdy discharge    Cervix: No lesions, cysts, or polyps.  Appears closed.  Pap Collected. No CMT  Uterine Fundus ~3FB below  Umbilicus   Adnexa: No tenderness in cul de sac   Bony Pelvis: Average  System: General: well-developed, well-nourished female in no acute distress   Breasts:  Deferred   Skin: normal coloration and turgor, no rashes   Neurologic: oriented, normal, negative, normal mood   Extremities: normal strength, tone, and muscle mass, ROM of all joints is normal   HEENT PERRLA, extraocular movement intact and sclera clear   Mouth/Teeth mucous membranes moist, pharynx normal without lesions and dental hygiene good   Neck supple and no masses   Cardiovascular: regular rate and rhythm   Respiratory:  no respiratory distress, normal breath sounds   Abdomen: soft, non-tender; bowel sounds normal; no masses,  no organomegaly    Assessment:   Pregnancy:  G1P0000 Patient Active Problem List   Diagnosis Date Noted  . Supervision of low-risk first pregnancy 07/20/2018  . GERD (gastroesophageal reflux disease) 07/20/2018     Plan:  1. Encounter for supervision of low-risk first pregnancy, antepartum Discussed round ligament pain. Educated on breastfeeding to alleviate concerns regarding pain and inability to perform after delivery. Encouraged to start to find pediatricians and consider PP contraception methods Anticipatory Guidance for upcoming visits.  2. First Well Woman/Pelvic Exam Introduced to instruments associated with pelvic exam and pap smear Discussed what to expect regarding feelings and results Discussed ASCCP guidelines and clinic expectations regarding follow up for results.  - Cytology - PAP( [place]) - Flu Vaccine QUAD 36+ mos IM - CHL AMB BABYSCRIPTS OPT IN   Initial labs reviewed. Genetic Screening discussed, Inheritest Ordered and Normal: results reviewed. Ultrasound discussed; fetal anatomic survey: ordered and scheduled for March 5th Problem list reviewed and updated. The nature of [facility] with multiple MDs and other Advanced Practice Providers was explained to patient; also emphasized that residents, students are part of our team. Routine obstetric precautions reviewed. Return in about 4 weeks (around 08/30/2018) for ROB.   Cherre Robins, CNM 08/02/2018 9:22 AM

## 2018-08-02 NOTE — Patient Instructions (Addendum)
First Trimester of Pregnancy The first trimester of pregnancy is from week 1 until the end of week 13 (months 1 through 3). A week after a sperm fertilizes an egg, the egg will implant on the wall of the uterus. This embryo will begin to develop into a baby. Genes from you and your partner will form the baby. The female genes will determine whether the baby will be a boy or a girl. At 6-8 weeks, the eyes and face will be formed, and the heartbeat can be seen on ultrasound. At the end of 12 weeks, all the baby's organs will be formed. Now that you are pregnant, you will want to do everything you can to have a healthy baby. Two of the most important things are to get good prenatal care and to follow your health care provider's instructions. Prenatal care is all the medical care you receive before the baby's birth. This care will help prevent, find, and treat any problems during the pregnancy and childbirth. Body changes during your first trimester Your body goes through many changes during pregnancy. The changes vary from woman to woman.  You may gain or lose a couple of pounds at first.  You may feel sick to your stomach (nauseous) and you may throw up (vomit). If the vomiting is uncontrollable, call your health care provider.  You may tire easily.  You may develop headaches that can be relieved by medicines. All medicines should be approved by your health care provider.  You may urinate more often. Painful urination may mean you have a bladder infection.  You may develop heartburn as a result of your pregnancy.  You may develop constipation because certain hormones are causing the muscles that push stool through your intestines to slow down.  You may develop hemorrhoids or swollen veins (varicose veins).  Your breasts may begin to grow larger and become tender. Your nipples may stick out more, and the tissue that surrounds them (areola) may become darker.  Your gums may bleed and may be  sensitive to brushing and flossing.  Dark spots or blotches (chloasma, mask of pregnancy) may develop on your face. This will likely fade after the baby is born.  Your menstrual periods will stop.  You may have a loss of appetite.  You may develop cravings for certain kinds of food.  You may have changes in your emotions from day to day, such as being excited to be pregnant or being concerned that something may go wrong with the pregnancy and baby.  You may have more vivid and strange dreams.  You may have changes in your hair. These can include thickening of your hair, rapid growth, and changes in texture. Some women also have hair loss during or after pregnancy, or hair that feels dry or thin. Your hair will most likely return to normal after your baby is born. What to expect at prenatal visits During a routine prenatal visit:  You will be weighed to make sure you and the baby are growing normally.  Your blood pressure will be taken.  Your abdomen will be measured to track your baby's growth.  The fetal heartbeat will be listened to between weeks 10 and 14 of your pregnancy.  Test results from any previous visits will be discussed. Your health care provider may ask you:  How you are feeling.  If you are feeling the baby move.  If you have had any abnormal symptoms, such as leaking fluid, bleeding, severe headaches, or abdominal   cramping.  If you are using any tobacco products, including cigarettes, chewing tobacco, and electronic cigarettes.  If you have any questions. Other tests that may be performed during your first trimester include:  Blood tests to find your blood type and to check for the presence of any previous infections. The tests will also be used to check for low iron levels (anemia) and protein on red blood cells (Rh antibodies). Depending on your risk factors, or if you previously had diabetes during pregnancy, you may have tests to check for high blood sugar  that affects pregnant women (gestational diabetes).  Urine tests to check for infections, diabetes, or protein in the urine.  An ultrasound to confirm the proper growth and development of the baby.  Fetal screens for spinal cord problems (spina bifida) and Down syndrome.  HIV (human immunodeficiency virus) testing. Routine prenatal testing includes screening for HIV, unless you choose not to have this test.  You may need other tests to make sure you and the baby are doing well. Follow these instructions at home: Medicines  Follow your health care provider's instructions regarding medicine use. Specific medicines may be either safe or unsafe to take during pregnancy.  Take a prenatal vitamin that contains at least 600 micrograms (mcg) of folic acid.  If you develop constipation, try taking a stool softener if your health care provider approves. Eating and drinking   Eat a balanced diet that includes fresh fruits and vegetables, whole grains, good sources of protein such as meat, eggs, or tofu, and low-fat dairy. Your health care provider will help you determine the amount of weight gain that is right for you.  Avoid raw meat and uncooked cheese. These carry germs that can cause birth defects in the baby.  Eating four or five small meals rather than three large meals a day may help relieve nausea and vomiting. If you start to feel nauseous, eating a few soda crackers can be helpful. Drinking liquids between meals, instead of during meals, also seems to help ease nausea and vomiting.  Limit foods that are high in fat and processed sugars, such as fried and sweet foods.  To prevent constipation: ? Eat foods that are high in fiber, such as fresh fruits and vegetables, whole grains, and beans. ? Drink enough fluid to keep your urine clear or pale yellow. Activity  Exercise only as directed by your health care provider. Most women can continue their usual exercise routine during  pregnancy. Try to exercise for 30 minutes at least 5 days a week. Exercising will help you: ? Control your weight. ? Stay in shape. ? Be prepared for labor and delivery.  Experiencing pain or cramping in the lower abdomen or lower back is a good sign that you should stop exercising. Check with your health care provider before continuing with normal exercises.  Try to avoid standing for long periods of time. Move your legs often if you must stand in one place for a long time.  Avoid heavy lifting.  Wear low-heeled shoes and practice good posture.  You may continue to have sex unless your health care provider tells you not to. Relieving pain and discomfort  Wear a good support bra to relieve breast tenderness.  Take warm sitz baths to soothe any pain or discomfort caused by hemorrhoids. Use hemorrhoid cream if your health care provider approves.  Rest with your legs elevated if you have leg cramps or low back pain.  If you develop varicose veins in   your legs, wear support hose. Elevate your feet for 15 minutes, 3-4 times a day. Limit salt in your diet. Prenatal care  Schedule your prenatal visits by the twelfth week of pregnancy. They are usually scheduled monthly at first, then more often in the last 2 months before delivery.  Write down your questions. Take them to your prenatal visits.  Keep all your prenatal visits as told by your health care provider. This is important. Safety  Wear your seat belt at all times when driving.  Make a list of emergency phone numbers, including numbers for family, friends, the hospital, and police and fire departments. General instructions  Ask your health care provider for a referral to a local prenatal education class. Begin classes no later than the beginning of month 6 of your pregnancy.  Ask for help if you have counseling or nutritional needs during pregnancy. Your health care provider can offer advice or refer you to specialists for help  with various needs.  Do not use hot tubs, steam rooms, or saunas.  Do not douche or use tampons or scented sanitary pads.  Do not cross your legs for long periods of time.  Avoid cat litter boxes and soil used by cats. These carry germs that can cause birth defects in the baby and possibly loss of the fetus by miscarriage or stillbirth.  Avoid all smoking, herbs, alcohol, and medicines not prescribed by your health care provider. Chemicals in these products affect the formation and growth of the baby.  Do not use any products that contain nicotine or tobacco, such as cigarettes and e-cigarettes. If you need help quitting, ask your health care provider. You may receive counseling support and other resources to help you quit.  Schedule a dentist appointment. At home, brush your teeth with a soft toothbrush and be gentle when you floss. Contact a health care provider if:  You have dizziness.  You have mild pelvic cramps, pelvic pressure, or nagging pain in the abdominal area.  You have persistent nausea, vomiting, or diarrhea.  You have a bad smelling vaginal discharge.  You have pain when you urinate.  You notice increased swelling in your face, hands, legs, or ankles.  You are exposed to fifth disease or chickenpox.  You are exposed to [place] measles (rubella) and have never had it. Get help right away if:  You have a fever.  You are leaking fluid from your vagina.  You have spotting or bleeding from your vagina.  You have severe abdominal cramping or pain.  You have rapid weight gain or loss.  You vomit blood or material that looks like coffee grounds.  You develop a severe headache.  You have shortness of breath.  You have any kind of trauma, such as from a fall or a car accident. Summary  The first trimester of pregnancy is from week 1 until the end of week 13 (months 1 through 3).  Your body goes through many changes during pregnancy. The changes vary from  woman to woman.  You will have routine prenatal visits. During those visits, your health care provider will examine you, discuss any test results you may have, and talk with you about how you are feeling. This information is not intended to replace advice given to you by your health care provider. Make sure you discuss any questions you have with your health care provider. Document Released: 05/26/2001 Document Revised: 05/13/2016 Document Reviewed: 05/13/2016 Elsevier Interactive Patient Education  2019 [employer]. Eating Plan  the end of week 13 (months 1 through 3).   Your body goes through many changes during pregnancy. The changes vary from  woman to woman.   You will have routine prenatal visits. During those visits, your health care provider will examine you, discuss any test results you may have, and talk with you about how you are feeling.  This information is not intended to replace advice given to you by your health care provider. Make sure you discuss any questions you have with your health care provider.  Document Released: 05/26/2001 Document Revised: 05/13/2016 Document Reviewed: 05/13/2016  Elsevier Interactive Patient Education  2019 [employer] Inc.    Eating Plan for Pregnant Women  While you are pregnant, your body requires additional nutrition to help support your growing baby. You also have a higher need for some vitamins and minerals, such as folic acid, calcium, iron, and vitamin D. Eating a healthy, well-balanced diet is very important for your health and your baby's health. Your need for extra calories varies for the three 3-month segments of your pregnancy (trimesters). For most women, it is recommended to consume:   150 extra calories a day during the first trimester.   300 extra calories a day during the second trimester.   300 extra calories a day during the third trimester.  What are tips for following this plan?     Do not try to lose weight or go on a diet during pregnancy.   Limit your overall intake of foods that have "empty calories." These are foods that have little nutritional value, such as sweets, desserts, candies, and sugar-sweetened beverages.   Eat a variety of foods (especially fruits and vegetables) to get a full range of vitamins and minerals.   Take a prenatal vitamin to help meet your additional vitamin and mineral needs during pregnancy, specifically for folic acid, iron, calcium, and vitamin D.   Remember to stay active. Ask your health care provider what types of exercise and activities are safe for you.   Practice good food safety and cleanliness. Wash your hands before you eat and after you  prepare raw meat. Wash all fruits and vegetables well before peeling or eating. Taking these actions can help to prevent food-borne illnesses that can be very dangerous to your baby, such as listeriosis. Ask your health care provider for more information about listeriosis.  What does 150 extra calories look like?  Healthy options that provide 150 extra calories each day could be any of the following:   6-8 oz (170-230 g) of plain low-fat yogurt with  cup of berries.   1 apple with 2 teaspoons (11 g) of peanut butter.   Cut-up vegetables with  cup (60 g) of hummus.   8 oz (230 mL) or 1 cup of low-fat chocolate milk.   1 stick of string cheese with 1 medium orange.   1 peanut butter and jelly sandwich that is made with one slice of whole-wheat bread and 1 tsp (5 g) of peanut butter.  For 300 extra calories, you could eat two of those healthy options each day.  What is a healthy amount of weight to gain?  The right amount of weight gain for you is based on your BMI before you became pregnant. If your BMI:   Was less than 18 (underweight), you should gain 28-40 lb (13-18 kg).   Was 18-24.9 (normal), you should gain 25-35 lb (11-16 kg).   Was 25-29.9 (overweight), you should gain 15-25 lb (7-11   fruits well before peeling or eating. Meats and other protein foods Lean meats, including chicken, [place], fish, and lean cuts of beef, veal, or pork. If you eat fish or seafood, choose options that are higher in omega-3 fatty acids and lower in mercury, such as salmon, herring, mussels, trout, sardines, pollock, shrimp, crab, and lobster. Tofu. Tempeh. Beans. Eggs. Peanut butter and other nut butters. Make sure that all meats, poultry, and eggs are cooked to food-safe temperatures or "well-done." Two or more servings of fish are recommended each week in order to get the most benefits from omega-3 fatty acids that are found in seafood. Choose fish that are lower in mercury. You can find more information online:  PumpkinSearch.com.ee Dairy Pasteurized milk and milk alternatives (such as almond milk). Pasteurized yogurt and pasteurized cheese. Cottage cheese. Sour cream. Beverages Water. Juices that contain 100% fruit juice or vegetable juice. Caffeine-free teas and decaffeinated coffee. Drinks that contain caffeine are okay to drink, but it is better to avoid caffeine. Keep your total caffeine intake to less than 200 mg each day (which is 12 oz or 355 mL of coffee, tea, or soda) or the limit as told by your health care provider. Fats and oils Fats and oils are okay to include in moderation. Sweets and desserts Sweets and desserts are okay to include in moderation. Seasoning and other foods All pasteurized condiments. The items listed above may not be a complete list of recommended foods and beverages. Contact your dietitian for more options. What foods are not recommended? Vegetables Raw (unpasteurized) vegetable juices. Fruits Unpasteurized fruit juices. Meats and other protein foods Lunch meats, bologna, hot dogs, or other deli meats. (If you must eat those meats, reheat them until they are steaming hot.) Refrigerated pat, meat spreads  from a meat counter, smoked seafood that is found in the refrigerated section of a store. Raw or undercooked meats, poultry, and eggs. Raw fish, such as sushi or sashimi. Fish that have high mercury content, such as tilefish, shark, swordfish, and king mackerel. To learn more about mercury in fish, talk with your health care provider or look for online resources, such as:  PumpkinSearch.com.ee Dairy Raw (unpasteurized) milk and any foods that have raw milk in them. Soft cheeses, such as feta, queso blanco, queso fresco, Brie, Camembert cheeses, blue-veined cheeses, and Panela cheese (unless it is made with pasteurized milk, which must be stated on the label). Beverages Alcohol. Sugar-sweetened beverages, such as sodas, teas, or energy drinks. Seasoning and other foods Homemade fermented foods and drinks, such as pickles, sauerkraut, or kombucha drinks. (Store-bought pasteurized versions of these are okay.) Salads that are made in a store or deli, such as ham salad, chicken salad, egg salad, tuna salad, and seafood salad. The items listed above may not be a complete list of foods and beverages to avoid. Contact your dietitian for more information. Where to find more information To calculate the number of calories you need based on your height, weight, and activity level, you can use an online calculator such as:  PackageNews.is To calculate how much weight you should gain during pregnancy, you can use an online pregnancy weight gain calculator such as:  http://jones-berg.com/ Summary  While you are pregnant, your body requires additional nutrition to help support your growing baby.  Eat a variety of foods, especially fruits and vegetables to get a full range of vitamins and minerals.  Practice good food safety and cleanliness. Wash your hands before you eat and after you prepare raw  Sugar-sweetened beverages, such as sodas, teas, or energy drinks.  Seasoning and other foods  Homemade fermented foods and drinks, such as pickles, sauerkraut, or kombucha drinks. (Store-bought pasteurized versions of these are okay.)  Salads that are made in a store or deli, such as ham salad, chicken salad, egg salad, tuna salad, and seafood salad.  The items listed above may not be a complete list of foods and beverages to avoid. Contact your dietitian for more information.  Where to find more information  To calculate the number of calories you need based on your height, weight, and activity level, you can use an online calculator such as:   www.choosemyplate.gov/MyPlatePlan  To calculate how much weight you should gain during pregnancy, you can use an online pregnancy weight gain calculator such as:   www.choosemyplate.gov/pregnancy-weight-gain-calculator  Summary   While you are pregnant, your body requires additional nutrition to help support your growing baby.   Eat a variety of foods, especially fruits and vegetables to get a full range of vitamins and minerals.   Practice good food safety and cleanliness. Wash your hands before you eat and after you prepare raw meat. Wash all fruits and vegetables well before peeling  or eating. Taking these actions can help to prevent food-borne illnesses, such as listeriosis, that can be very dangerous to your baby.   Do not eat raw meat or fish. Do not eat fish that have high mercury content, such as tilefish, shark, swordfish, and king mackerel. Do not eat unpasteurized (raw) dairy.   Take a prenatal vitamin to help meet your additional vitamin and mineral needs during pregnancy, specifically for folic acid, iron, calcium, and vitamin D.  This information is not intended to replace advice given to you by your health care provider. Make sure you discuss any questions you have with your health care provider.  Document Released: 03/16/2014 Document Revised: 02/26/2017 Document Reviewed: 02/26/2017  Elsevier Interactive Patient Education  2019 [employer]vier Inc.

## 2018-08-05 LAB — CYTOLOGY - PAP: Diagnosis: UNDETERMINED — AB

## 2018-08-18 ENCOUNTER — Other Ambulatory Visit (HOSPITAL_COMMUNITY): Payer: Self-pay | Admitting: *Deleted

## 2018-08-18 ENCOUNTER — Ambulatory Visit (HOSPITAL_COMMUNITY)
Admission: RE | Admit: 2018-08-18 | Discharge: 2018-08-18 | Disposition: A | Discharge status: Home / Self Care | Payer: Medicaid Other | Source: Ambulatory Visit | Attending: Obstetrics & Gynecology | Admitting: Obstetrics & Gynecology

## 2018-08-18 DIAGNOSIS — Z3A18 18 weeks gestation of pregnancy: Secondary | ICD-10-CM | POA: Diagnosis not present

## 2018-08-18 DIAGNOSIS — Z3400 Encounter for supervision of normal first pregnancy, unspecified trimester: Secondary | ICD-10-CM | POA: Diagnosis present

## 2018-08-18 DIAGNOSIS — Z363 Encounter for antenatal screening for malformations: Secondary | ICD-10-CM

## 2018-08-18 DIAGNOSIS — Z362 Encounter for other antenatal screening follow-up: Secondary | ICD-10-CM

## 2018-08-31 ENCOUNTER — Other Ambulatory Visit (HOSPITAL_COMMUNITY): Payer: Self-pay | Admitting: Advanced Practice Midwife

## 2018-08-31 ENCOUNTER — Telehealth: Payer: Self-pay

## 2018-08-31 NOTE — Telephone Encounter (Signed)
Called patient for appointment information for 08/31/2018 Pt verbalized understanding and asked to be reschedule for 09/15/2018 after 9am because she has a [facility] appointment that same day.

## 2018-09-01 ENCOUNTER — Encounter: Payer: Self-pay | Admitting: Obstetrics and Gynecology

## 2018-09-06 ENCOUNTER — Encounter: Payer: Self-pay | Admitting: *Deleted

## 2018-09-09 ENCOUNTER — Inpatient Hospital Stay (HOSPITAL_COMMUNITY)
Admission: AD | Admit: 2018-09-09 | Discharge: 2018-09-09 | Disposition: A | Discharge status: Home / Self Care | Payer: Medicaid Other | Source: Home / Self Care | Attending: Obstetrics and Gynecology | Admitting: Obstetrics and Gynecology

## 2018-09-09 ENCOUNTER — Encounter (HOSPITAL_COMMUNITY): Payer: Self-pay | Admitting: *Deleted

## 2018-09-09 ENCOUNTER — Inpatient Hospital Stay (HOSPITAL_COMMUNITY)
Admission: AD | Admit: 2018-09-09 | Discharge: 2018-09-09 | Disposition: A | Discharge status: Home / Self Care | Payer: Medicaid Other | Attending: Obstetrics and Gynecology | Admitting: Obstetrics and Gynecology

## 2018-09-09 ENCOUNTER — Other Ambulatory Visit: Payer: Self-pay

## 2018-09-09 DIAGNOSIS — O212 Late vomiting of pregnancy: Secondary | ICD-10-CM | POA: Insufficient documentation

## 2018-09-09 DIAGNOSIS — K529 Noninfective gastroenteritis and colitis, unspecified: Secondary | ICD-10-CM

## 2018-09-09 DIAGNOSIS — O9989 Other specified diseases and conditions complicating pregnancy, childbirth and the puerperium: Secondary | ICD-10-CM

## 2018-09-09 DIAGNOSIS — Z3A21 21 weeks gestation of pregnancy: Secondary | ICD-10-CM

## 2018-09-09 DIAGNOSIS — K219 Gastro-esophageal reflux disease without esophagitis: Secondary | ICD-10-CM | POA: Insufficient documentation

## 2018-09-09 DIAGNOSIS — O99612 Diseases of the digestive system complicating pregnancy, second trimester: Secondary | ICD-10-CM | POA: Diagnosis not present

## 2018-09-09 DIAGNOSIS — O26892 Other specified pregnancy related conditions, second trimester: Secondary | ICD-10-CM | POA: Diagnosis not present

## 2018-09-09 DIAGNOSIS — Z79899 Other long term (current) drug therapy: Secondary | ICD-10-CM | POA: Insufficient documentation

## 2018-09-09 DIAGNOSIS — R109 Unspecified abdominal pain: Principal | ICD-10-CM

## 2018-09-09 LAB — URINALYSIS, ROUTINE W REFLEX MICROSCOPIC
Bilirubin Urine: NEGATIVE
Glucose, UA: NEGATIVE mg/dL
Hgb urine dipstick: NEGATIVE
KETONES UR: 5 mg/dL — AB
Leukocytes,Ua: NEGATIVE
Nitrite: NEGATIVE
PH: 9 — AB (ref 5.0–8.0)
Protein, ur: 30 mg/dL — AB
Specific Gravity, Urine: 1.023 (ref 1.005–1.030)

## 2018-09-09 LAB — COMPREHENSIVE METABOLIC PANEL
ALT: 19 U/L (ref 0–44)
AST: 19 U/L (ref 15–41)
Albumin: 3.3 g/dL — ABNORMAL LOW (ref 3.5–5.0)
Alkaline Phosphatase: 48 U/L (ref 38–126)
Anion gap: 10 (ref 5–15)
BUN: <5 mg/dL — ABNORMAL LOW (ref 6–20)
CO2: 18 mmol/L — AB (ref 22–32)
Calcium: 9.1 mg/dL (ref 8.9–10.3)
Chloride: 107 mmol/L (ref 98–111)
Creatinine, Ser: 0.65 mg/dL (ref 0.44–1.00)
GFR calc Af Amer: >60 mL/min (ref >60)
GFR calc non Af Amer: >60 mL/min (ref >60)
Glucose, Bld: 83 mg/dL (ref 70–99)
Potassium: 3.3 mmol/L — ABNORMAL LOW (ref 3.5–5.1)
SODIUM: 135 mmol/L (ref 135–145)
Total Bilirubin: 0.7 mg/dL (ref 0.3–1.2)
Total Protein: 6.1 g/dL — ABNORMAL LOW (ref 6.5–8.1)

## 2018-09-09 LAB — WET PREP, GENITAL
Clue Cells Wet Prep HPF POC: NONE SEEN
Sperm: NONE SEEN
Trich, Wet Prep: NONE SEEN
Yeast Wet Prep HPF POC: NONE SEEN

## 2018-09-09 LAB — CBC
HCT: 32.7 % — ABNORMAL LOW (ref 36.0–46.0)
Hemoglobin: 11.1 g/dL — ABNORMAL LOW (ref 12.0–15.0)
MCH: 32.6 pg (ref 26.0–34.0)
MCHC: 33.9 g/dL (ref 30.0–36.0)
MCV: 96.2 fL (ref 80.0–100.0)
Platelets: 191 10*3/uL (ref 150–400)
RBC: 3.4 MIL/uL — ABNORMAL LOW (ref 3.87–5.11)
RDW: 12.4 % (ref 11.5–15.5)
WBC: 8.9 10*3/uL (ref 4.0–10.5)
nRBC: 0 % (ref 0.0–0.2)

## 2018-09-09 MED ORDER — DICYCLOMINE HCL 10 MG PO CAPS: 10.0000 mg | ORAL_CAPSULE | Freq: Three times a day (TID) | ORAL | 0 refills | Status: DC | PRN

## 2018-09-09 MED ORDER — DICYCLOMINE HCL 10 MG PO CAPS
10.0000 mg | ORAL_CAPSULE | Freq: Once | ORAL | Status: AC
  Administered 2018-09-09: 10 mg via ORAL
  Filled 2018-09-09: qty 1

## 2018-09-09 MED ORDER — LACTATED RINGERS IV BOLUS
1000.0000 mL | Freq: Once | INTRAVENOUS | Status: AC
  Administered 2018-09-09: 1000 mL via INTRAVENOUS

## 2018-09-09 MED ORDER — ONDANSETRON 4 MG PO TBDP
8.0000 mg | ORAL_TABLET | Freq: Once | ORAL | Status: AC
  Administered 2018-09-09: 8 mg via ORAL
  Filled 2018-09-09: qty 2

## 2018-09-09 MED ORDER — SIMETHICONE 80 MG PO CHEW
80.0000 mg | CHEWABLE_TABLET | Freq: Once | ORAL | Status: AC
  Administered 2018-09-09: 80 mg via ORAL
  Filled 2018-09-09: qty 1

## 2018-09-09 NOTE — MAU Provider Note (Signed)
Chief Complaint: Abdominal Pain   First Provider Initiated Contact with Patient 09/09/18 1236      SUBJECTIVE HPI: Elizabeth Shaffer is a 23 y.o. G1P0000 at [redacted]w[redacted]d by early [place] who presents to maternity admissions reporting abdominal cramping, and n/v/d.  She reports nausea with 2-3 episodes of vomiting and 1-2 episodes of diarrhea/loose stool started 3 days ago.  The nausea and diarrhea are improved but she has intermittent low and mid abdominal cramping today. There are no other symptoms. She has Zofran Rx but has not taken any today.  She has not tried any other treatments.   She denies vaginal bleeding, vaginal itching/burning, urinary symptoms, h/a, dizziness, or fever/chills.     HPI  Past Medical History:  Diagnosis Date  . GERD (gastroesophageal reflux disease)   . Headache    Past Surgical History:  Procedure Laterality Date  . MOUTH SURGERY    . MOUTH SURGERY  2011  . MULTIPLE TOOTH EXTRACTIONS    . NO PAST SURGERIES    . WISDOM TOOTH EXTRACTION     Social History   Socioeconomic History  . Marital status: Single    Spouse name: Not on file  . Number of children: Not on file  . Years of education: Not on file  . Highest education level: 12th grade  Occupational History  . Occupation: Personnel officer    Comment: [name]  Social Needs  . Financial resource strain: Not on file  . Food insecurity:    Worry: Often true    Inability: Often true  . Transportation needs:    Medical: No    Non-medical: No  Tobacco Use  . Smoking status: Never Smoker  . Smokeless tobacco: Never Used  Substance and Sexual Activity  . Alcohol use: Not Currently    Comment: on occ  . Drug use: Yes    Types: Marijuana    Comment: none since November 2019  . Sexual activity: Yes    Birth control/protection: None  Lifestyle  . Physical activity:    Days per week: Not on file    Minutes per session: Not on file  . Stress: Not on file  Relationships  . Social connections:    Talks on  phone: Not on file    Gets together: Not on file    Attends religious service: Not on file    Active member of club or organization: Not on file    Attends meetings of clubs or organizations: Not on file    Relationship status: Not on file  . Intimate partner violence:    Fear of current or ex partner: No    Emotionally abused: No    Physically abused: No    Forced sexual activity: No  Other Topics Concern  . Not on file  Social History Narrative  . Not on file   No current facility-administered medications on file prior to encounter.    Current Outpatient Medications on File Prior to Encounter  Medication Sig Dispense Refill  . famotidine (PEPCID) 20 MG tablet Take 1 tablet (20 mg total) by mouth 2 (two) times daily. 60 tablet 0  . ondansetron (ZOFRAN-ODT) 8 MG disintegrating tablet DISSOLVE 1 TABLET(8 MG) ON THE TONGUE EVERY 8 HOURS AS NEEDED FOR NAUSEA OR VOMITING 20 tablet 2  . Prenatal Multivit-Min-Fe-FA (PRENATAL VITAMINS) 0.8 MG tablet Take 1 tablet by mouth daily. 30 tablet 12  . promethazine (PHENERGAN) 25 MG tablet Take 1 tablet (25 mg total) by mouth every 6 (six)  hours as needed for nausea or vomiting. 30 tablet 2  . Prenatal Vit-Fe Fumarate-FA (MULTIVITAMIN-PRENATAL) 27-0.8 MG TABS tablet Take 1 tablet by mouth daily at 12 noon.     No Known Allergies  ROS:  Review of Systems  Constitutional: Negative for chills, fatigue and fever.  Eyes: Negative for visual disturbance.  Respiratory: Negative for shortness of breath.   Cardiovascular: Negative for chest pain.  Gastrointestinal: Positive for abdominal pain, diarrhea, nausea and vomiting.  Genitourinary: Negative for difficulty urinating, dysuria, flank pain, pelvic pain, vaginal bleeding, vaginal discharge and vaginal pain.  Neurological: Negative for dizziness and headaches.  Psychiatric/Behavioral: Negative.      I have reviewed patient's Past Medical Hx, Surgical Hx, Family Hx, Social Hx, medications and  allergies.   Physical Exam   Patient Vitals for the past 24 hrs:  BP Temp Pulse Resp  09/09/18 1440 (!) 104/49 - 80 16  09/09/18 1202 111/76 97.6 F (36.4 C) 81 16   Constitutional: Well-developed, well-nourished female in no acute distress.  Cardiovascular: normal rate Respiratory: normal effort GI: Abd soft, non-tender. Pos BS x 4 MS: Extremities nontender, no edema, normal ROM Neurologic: Alert and oriented x 4.  GU: Neg CVAT.  PELVIC EXAM: Wet prep/GC collected by blind swab  FHT 145 by doppler  LAB RESULTS Results for orders placed or performed during the hospital encounter of 09/09/18 (from the past 24 hour(s))  Urinalysis, Routine w reflex microscopic     Status: Abnormal   Collection Time: 09/09/18 12:04 PM  Result Value Ref Range   Color, Urine YELLOW YELLOW   APPearance HAZY (A) CLEAR   Specific Gravity, Urine 1.023 1.005 - 1.030   pH 9.0 (H) 5.0 - 8.0   Glucose, UA NEGATIVE NEGATIVE mg/dL   Hgb urine dipstick NEGATIVE NEGATIVE   Bilirubin Urine NEGATIVE NEGATIVE   Ketones, ur 5 (A) NEGATIVE mg/dL   Protein, ur 30 (A) NEGATIVE mg/dL   Nitrite NEGATIVE NEGATIVE   Leukocytes,Ua NEGATIVE NEGATIVE   RBC / HPF 0-5 0 - 5 RBC/hpf   WBC, UA 0-5 0 - 5 WBC/hpf   Bacteria, UA FEW (A) NONE SEEN   Squamous Epithelial / LPF 0-5 0 - 5   Mucus PRESENT   Wet prep, genital     Status: Abnormal   Collection Time: 09/09/18 12:45 PM  Result Value Ref Range   Yeast Wet Prep HPF POC NONE SEEN NONE SEEN   Trich, Wet Prep NONE SEEN NONE SEEN   Clue Cells Wet Prep HPF POC NONE SEEN NONE SEEN   WBC, Wet Prep HPF POC FEW (A) NONE SEEN   Sperm NONE SEEN   CBC     Status: Abnormal   Collection Time: 09/09/18  1:00 PM  Result Value Ref Range   WBC 8.9 4.0 - 10.5 K/uL   RBC 3.40 (L) 3.87 - 5.11 MIL/uL   Hemoglobin 11.1 (L) 12.0 - 15.0 g/dL   HCT 16.132.7 (L) 09.636.0 - 04.546.0 %   MCV 96.2 80.0 - 100.0 fL   MCH 32.6 26.0 - 34.0 pg   MCHC 33.9 30.0 - 36.0 g/dL   RDW 40.912.4 81.111.5 - 91.415.5 %    Platelets 191 150 - 400 K/uL   nRBC 0.0 0.0 - 0.2 %  Comprehensive metabolic panel     Status: Abnormal   Collection Time: 09/09/18  1:00 PM  Result Value Ref Range   Sodium 135 135 - 145 mmol/L   Potassium 3.3 (L) 3.5 - 5.1 mmol/L  Chloride 107 98 - 111 mmol/L   CO2 18 (L) 22 - 32 mmol/L   Glucose, Bld 83 70 - 99 mg/dL   BUN <5 (L) 6 - 20 mg/dL   Creatinine, Ser 4.03 0.44 - 1.00 mg/dL   Calcium 9.1 8.9 - 47.4 mg/dL   Total Protein 6.1 (L) 6.5 - 8.1 g/dL   Albumin 3.3 (L) 3.5 - 5.0 g/dL   AST 19 15 - 41 U/L   ALT 19 0 - 44 U/L   Alkaline Phosphatase 48 38 - 126 U/L   Total Bilirubin 0.7 0.3 - 1.2 mg/dL   GFR calc non Af Amer >60 >60 mL/min   GFR calc Af Amer >60 >60 mL/min   Anion gap 10 5 - 15    B/Positive/-- (02/04 1030)  IMAGING   MAU Management/MDM: Orders Placed This Encounter  Procedures  . Wet prep, genital  . Urinalysis, Routine w reflex microscopic  . CBC  . Comprehensive metabolic panel  . Discharge patient    Meds ordered this encounter  Medications  . lactated ringers bolus 1,000 mL  . ondansetron (ZOFRAN-ODT) disintegrating tablet 8 mg    Wet prep wnl, UA shows mild dehydration.  CBC, CMP essentially normal.  IV fluids given, Zofran 8 mg ODT given. Pt with resolution of symptoms.  D/C home, increase PO fluids, take Rx Zofran PRN to assist with oral intake. F/U in office as scheduled, return to MAU as needed for emergencies.    ASSESSMENT 1. Abdominal pain during pregnancy in second trimester     PLAN Discharge home Allergies as of 09/09/2018   No Known Allergies     Medication List    STOP taking these medications   docusate sodium 100 MG capsule Commonly known as:  COLACE     TAKE these medications   famotidine 20 MG tablet Commonly known as:  Pepcid Take 1 tablet (20 mg total) by mouth 2 (two) times daily.   multivitamin-prenatal 27-0.8 MG Tabs tablet Take 1 tablet by mouth daily at 12 noon.   ondansetron 8 MG disintegrating  tablet Commonly known as:  ZOFRAN-ODT DISSOLVE 1 TABLET(8 MG) ON THE TONGUE EVERY 8 HOURS AS NEEDED FOR NAUSEA OR VOMITING   Prenatal Vitamins 0.8 MG tablet Take 1 tablet by mouth daily.   promethazine 25 MG tablet Commonly known as:  PHENERGAN Take 1 tablet (25 mg total) by mouth every 6 (six) hours as needed for nausea or vomiting.      Follow-up Information    [facility] Follow up.   Specialty:  Obstetrics and Gynecology Why:  Return to MAU for emergencies. Contact information: [address] [place] [address] [phone]          Sharen Counter Certified Nurse-Midwife 09/09/2018  3:35 PM

## 2018-09-09 NOTE — MAU Note (Signed)
Pt presents to MAU with complaints of abdominal cramping. Pt was evaluated in MAU a couple hours ago and sent home. Pt states the abdominal cramping is worse

## 2018-09-09 NOTE — MAU Provider Note (Signed)
Chief Complaint: Abdominal Pain   First Provider Initiated Contact with Patient 09/09/18 1735      SUBJECTIVE HPI: Elizabeth Shaffer is a 23 y.o. G1P0000 at [redacted]w[redacted]d by early ultrasound who presents to maternity admissions reporting worsening abdominal pain since discharge from MAU earlier today.  Her pain is low in her abdomen, intermittent, and worsening since she ate then urinated at home after her MAU visit.  She has not had any more vomiting or diarrhea today.  There are no other symptoms. She has not tried any treatments.     HPI  Past Medical History:  Diagnosis Date  . GERD (gastroesophageal reflux disease)   . Headache    Past Surgical History:  Procedure Laterality Date  . MOUTH SURGERY    . MOUTH SURGERY  2011  . MULTIPLE TOOTH EXTRACTIONS    . NO PAST SURGERIES    . WISDOM TOOTH EXTRACTION     Social History   Socioeconomic History  . Marital status: Single    Spouse name: Not on file  . Number of children: Not on file  . Years of education: Not on file  . Highest education level: 12th grade  Occupational History  . Occupation: Personnel officer    Comment: [name]  Social Needs  . Financial resource strain: Not on file  . Food insecurity:    Worry: Often true    Inability: Often true  . Transportation needs:    Medical: No    Non-medical: No  Tobacco Use  . Smoking status: Never Smoker  . Smokeless tobacco: Never Used  Substance and Sexual Activity  . Alcohol use: Not Currently    Comment: on occ  . Drug use: Yes    Types: Marijuana    Comment: none since November 2019  . Sexual activity: Yes    Birth control/protection: None  Lifestyle  . Physical activity:    Days per week: Not on file    Minutes per session: Not on file  . Stress: Not on file  Relationships  . Social connections:    Talks on phone: Not on file    Gets together: Not on file    Attends religious service: Not on file    Active member of club or organization: Not on file    Attends  meetings of clubs or organizations: Not on file    Relationship status: Not on file  . Intimate partner violence:    Fear of current or ex partner: No    Emotionally abused: No    Physically abused: No    Forced sexual activity: No  Other Topics Concern  . Not on file  Social History Narrative  . Not on file   No current facility-administered medications on file prior to encounter.    Current Outpatient Medications on File Prior to Encounter  Medication Sig Dispense Refill  . famotidine (PEPCID) 20 MG tablet Take 1 tablet (20 mg total) by mouth 2 (two) times daily. 60 tablet 0  . ondansetron (ZOFRAN-ODT) 8 MG disintegrating tablet DISSOLVE 1 TABLET(8 MG) ON THE TONGUE EVERY 8 HOURS AS NEEDED FOR NAUSEA OR VOMITING 20 tablet 2  . Prenatal Multivit-Min-Fe-FA (PRENATAL VITAMINS) 0.8 MG tablet Take 1 tablet by mouth daily. 30 tablet 12  . Prenatal Vit-Fe Fumarate-FA (MULTIVITAMIN-PRENATAL) 27-0.8 MG TABS tablet Take 1 tablet by mouth daily at 12 noon.    . promethazine (PHENERGAN) 25 MG tablet Take 1 tablet (25 mg total) by mouth every 6 (six) hours as  needed for nausea or vomiting. 30 tablet 2   No Known Allergies  ROS:  Review of Systems  Constitutional: Negative for chills, fatigue and fever.  Respiratory: Negative for shortness of breath.   Cardiovascular: Negative for chest pain.  Gastrointestinal: Positive for abdominal pain.  Genitourinary: Positive for pelvic pain. Negative for difficulty urinating, dysuria, flank pain, vaginal bleeding, vaginal discharge and vaginal pain.  Neurological: Negative for dizziness and headaches.  Psychiatric/Behavioral: Negative.      I have reviewed patient's Past Medical Hx, Surgical Hx, Family Hx, Social Hx, medications and allergies.   Physical Exam   Patient Vitals for the past 24 hrs:  BP Temp Pulse Resp  09/09/18 1637 124/77 - - -  09/09/18 1636 124/77 98 F (36.7 C) 92 16   Constitutional: Well-developed, well-nourished female in  no acute distress.  Cardiovascular: normal rate Respiratory: normal effort GI: Abd soft, non-tender. Pos BS x 4 MS: Extremities nontender, no edema, normal ROM Neurologic: Alert and oriented x 4.  GU: Neg CVAT.  PELVIC EXAM: Cervix pink, visually closed, without lesion, scant white creamy discharge, vaginal walls and external genitalia normal Bimanual exam: Cervix 0/long/high, firm, anterior, neg CMT, uterus nontender, nonenlarged, adnexa without tenderness, enlargement, or mass  FHT 145 by doppler  LAB RESULTS Results for orders placed or performed during the hospital encounter of 09/09/18 (from the past 24 hour(s))  Urinalysis, Routine w reflex microscopic     Status: Abnormal   Collection Time: 09/09/18 12:04 PM  Result Value Ref Range   Color, Urine YELLOW YELLOW   APPearance HAZY (A) CLEAR   Specific Gravity, Urine 1.023 1.005 - 1.030   pH 9.0 (H) 5.0 - 8.0   Glucose, UA NEGATIVE NEGATIVE mg/dL   Hgb urine dipstick NEGATIVE NEGATIVE   Bilirubin Urine NEGATIVE NEGATIVE   Ketones, ur 5 (A) NEGATIVE mg/dL   Protein, ur 30 (A) NEGATIVE mg/dL   Nitrite NEGATIVE NEGATIVE   Leukocytes,Ua NEGATIVE NEGATIVE   RBC / HPF 0-5 0 - 5 RBC/hpf   WBC, UA 0-5 0 - 5 WBC/hpf   Bacteria, UA FEW (A) NONE SEEN   Squamous Epithelial / LPF 0-5 0 - 5   Mucus PRESENT   Wet prep, genital     Status: Abnormal   Collection Time: 09/09/18 12:45 PM  Result Value Ref Range   Yeast Wet Prep HPF POC NONE SEEN NONE SEEN   Trich, Wet Prep NONE SEEN NONE SEEN   Clue Cells Wet Prep HPF POC NONE SEEN NONE SEEN   WBC, Wet Prep HPF POC FEW (A) NONE SEEN   Sperm NONE SEEN   CBC     Status: Abnormal   Collection Time: 09/09/18  1:00 PM  Result Value Ref Range   WBC 8.9 4.0 - 10.5 K/uL   RBC 3.40 (L) 3.87 - 5.11 MIL/uL   Hemoglobin 11.1 (L) 12.0 - 15.0 g/dL   HCT 40.9 (L) 81.1 - 91.4 %   MCV 96.2 80.0 - 100.0 fL   MCH 32.6 26.0 - 34.0 pg   MCHC 33.9 30.0 - 36.0 g/dL   RDW 78.2 95.6 - 21.3 %   Platelets  191 150 - 400 K/uL   nRBC 0.0 0.0 - 0.2 %  Comprehensive metabolic panel     Status: Abnormal   Collection Time: 09/09/18  1:00 PM  Result Value Ref Range   Sodium 135 135 - 145 mmol/L   Potassium 3.3 (L) 3.5 - 5.1 mmol/L   Chloride 107 98 -  111 mmol/L   CO2 18 (L) 22 - 32 mmol/L   Glucose, Bld 83 70 - 99 mg/dL   BUN <5 (L) 6 - 20 mg/dL   Creatinine, Ser 9.32 0.44 - 1.00 mg/dL   Calcium 9.1 8.9 - 67.1 mg/dL   Total Protein 6.1 (L) 6.5 - 8.1 g/dL   Albumin 3.3 (L) 3.5 - 5.0 g/dL   AST 19 15 - 41 U/L   ALT 19 0 - 44 U/L   Alkaline Phosphatase 48 38 - 126 U/L   Total Bilirubin 0.7 0.3 - 1.2 mg/dL   GFR calc non Af Amer >60 >60 mL/min   GFR calc Af Amer >60 >60 mL/min   Anion gap 10 5 - 15    B/Positive/-- (02/04 1030)  IMAGING   MAU Management/MDM: Orders Placed This Encounter  Procedures  . Discharge patient    Meds ordered this encounter  Medications  . dicyclomine (BENTYL) capsule 10 mg  . simethicone (MYLICON) chewable tablet 80 mg  . dicyclomine (BENTYL) 10 MG capsule    Sig: Take 1 capsule (10 mg total) by mouth 3 (three) times daily as needed for spasms.    Dispense:  10 capsule    Refill:  0    Order Specific Question:   Supervising Provider    Answer:   CONSTANT, PEGGY [[id number]]    Pt returned to MAU with return of abdominal pain, similar to her previous MAU visit today.  No evidence of preterm labor with cervix remaining closed/thick/posterior.  Pt with GI symptoms for last 3 days associated with this pain so most likely pain is GI in nature.  Bentyl 10 mg PO and simethecone 80 mg tablet PO given in MAU.  Complete relief of symptoms with medications.  D/C home.  Rx for Bentyl 10 mg TID PRN x 10 tabs only, pt may take simethecone OTC PRN.  F/U in office as scheduled, return to MAU with emergencies.   ASSESSMENT 1. Abdominal pain during pregnancy in second trimester   2. Gastroenteritis, acute     PLAN Discharge home Allergies as of 09/09/2018   No Known  Allergies     Medication List    TAKE these medications   dicyclomine 10 MG capsule Commonly known as:  Bentyl Take 1 capsule (10 mg total) by mouth 3 (three) times daily as needed for spasms.   famotidine 20 MG tablet Commonly known as:  Pepcid Take 1 tablet (20 mg total) by mouth 2 (two) times daily.   multivitamin-prenatal 27-0.8 MG Tabs tablet Take 1 tablet by mouth daily at 12 noon.   ondansetron 8 MG disintegrating tablet Commonly known as:  ZOFRAN-ODT DISSOLVE 1 TABLET(8 MG) ON THE TONGUE EVERY 8 HOURS AS NEEDED FOR NAUSEA OR VOMITING   Prenatal Vitamins 0.8 MG tablet Take 1 tablet by mouth daily.   promethazine 25 MG tablet Commonly known as:  PHENERGAN Take 1 tablet (25 mg total) by mouth every 6 (six) hours as needed for nausea or vomiting.      Follow-up Information    [facility] Follow up.   Specialty:  Obstetrics and Gynecology Why:  As scheduled, return to MAU as needed for emergencies. Contact information: [address] [place] [address] [phone]          Sharen Counter Certified Nurse-Midwife 09/09/2018  6:47 PM

## 2018-09-09 NOTE — MAU Note (Signed)
Pt presents to MAU with complaints of lower abdominal cramping since yesterday. Denies any VB of abnormal discharge

## 2018-09-12 LAB — GC/CHLAMYDIA PROBE AMP (~~LOC~~) NOT AT ARMC
CHLAMYDIA, DNA PROBE: NEGATIVE
Neisseria Gonorrhea: NEGATIVE

## 2018-09-15 ENCOUNTER — Ambulatory Visit (HOSPITAL_COMMUNITY): Payer: Medicaid Other

## 2018-09-15 ENCOUNTER — Encounter: Payer: Self-pay | Admitting: Obstetrics and Gynecology

## 2018-10-03 ENCOUNTER — Encounter: Payer: Medicaid Other | Admitting: Family Medicine

## 2018-10-03 ENCOUNTER — Other Ambulatory Visit: Payer: Self-pay

## 2018-10-03 ENCOUNTER — Telehealth: Payer: Self-pay | Admitting: Emergency Medicine

## 2018-10-03 NOTE — Telephone Encounter (Signed)
Attempted to call pt x3 on each number provided. Left voicemail informing pt of attempts to reach her for her virtual appointment and to call us back at [phone].

## 2018-10-05 ENCOUNTER — Other Ambulatory Visit: Payer: Self-pay

## 2018-10-05 ENCOUNTER — Telehealth: Payer: Self-pay | Admitting: Emergency Medicine

## 2018-10-05 ENCOUNTER — Encounter: Payer: Medicaid Other | Admitting: Obstetrics & Gynecology

## 2018-10-05 NOTE — Telephone Encounter (Signed)
Attempted to call pt and start virtual visit. Pt stated that she was at work and would it be possible to reschedule for 4/23 because she had an ultrasound scheduled for that date. Pt was called and left a voicemail informing her of the details of her visit for 4/23. Pt was informed that it would be virtual and to call with any further questions.

## 2018-10-06 ENCOUNTER — Ambulatory Visit (INDEPENDENT_AMBULATORY_CARE_PROVIDER_SITE_OTHER): Payer: Medicaid Other

## 2018-10-06 ENCOUNTER — Other Ambulatory Visit (HOSPITAL_COMMUNITY): Payer: Self-pay | Admitting: *Deleted

## 2018-10-06 ENCOUNTER — Other Ambulatory Visit: Payer: Self-pay

## 2018-10-06 ENCOUNTER — Ambulatory Visit (HOSPITAL_COMMUNITY)
Admission: RE | Admit: 2018-10-06 | Discharge: 2018-10-06 | Disposition: A | Discharge status: Home / Self Care | Payer: Medicaid Other | Source: Ambulatory Visit | Attending: Obstetrics and Gynecology | Admitting: Obstetrics and Gynecology

## 2018-10-06 DIAGNOSIS — Z3A25 25 weeks gestation of pregnancy: Secondary | ICD-10-CM

## 2018-10-06 DIAGNOSIS — O26892 Other specified pregnancy related conditions, second trimester: Secondary | ICD-10-CM | POA: Diagnosis not present

## 2018-10-06 DIAGNOSIS — R102 Pelvic and perineal pain: Secondary | ICD-10-CM | POA: Diagnosis not present

## 2018-10-06 DIAGNOSIS — Z3400 Encounter for supervision of normal first pregnancy, unspecified trimester: Secondary | ICD-10-CM

## 2018-10-06 DIAGNOSIS — Z362 Encounter for other antenatal screening follow-up: Secondary | ICD-10-CM | POA: Diagnosis not present

## 2018-10-06 DIAGNOSIS — O26899 Other specified pregnancy related conditions, unspecified trimester: Secondary | ICD-10-CM

## 2018-10-06 DIAGNOSIS — Z363 Encounter for antenatal screening for malformations: Secondary | ICD-10-CM

## 2018-10-06 NOTE — Progress Notes (Signed)
   TELEHEALTH VIRTUAL OBSTETRICS VISIT ENCOUNTER NOTE  I connected with Elizabeth Shaffer on 10/06/18 at  3:55 PM EDT by telephone at home and verified that I am speaking with the correct person using two identifiers.   I discussed the limitations, risks, security and privacy concerns of performing an evaluation and management service by telephone and the availability of in person appointments. I also discussed with the patient that there may be a patient responsible charge related to this service. The patient expressed understanding and agreed to proceed.  Subjective:  Elizabeth Shaffer is a 23 y.o. G1P0000 at [redacted]w[redacted]d being followed for ongoing prenatal care.  She is currently monitored for the following issues for this low-risk pregnancy and has Supervision of low-risk first pregnancy and GERD (gastroesophageal reflux disease) on their problem list.  Patient reports occasional sharp shooting pelvic pain with movement. Reports fetal movement. Denies any contractions, bleeding or leaking of fluid.   The following portions of the patient's history were reviewed and updated as appropriate: allergies, current medications, past family history, past medical history, past social history, past surgical history and problem list.   Objective:   General:  Alert, oriented and cooperative.   Mental Status: Normal mood and affect perceived. Normal judgment and thought content.  Rest of physical exam deferred due to type of encounter  Assessment and Plan:  Pregnancy: G1P0000 at [redacted]w[redacted]d 1. Encounter for supervision of low-risk first pregnancy, antepartum -BP 120/80 -No complaints, routine care -Anticipatory guidance of next visit with GTT and labs - [facility] AMB BABYSCRIPTS SCHEDULE OPTIMIZATION  2. Pain of round ligament during pregnancy -Encouraged patient to use pregnancy support belt and reassured of normalcy  Preterm labor symptoms and general obstetric precautions including but not limited to vaginal bleeding,  contractions, leaking of fluid and fetal movement were reviewed in detail with the patient.  I discussed the assessment and treatment plan with the patient. The patient was provided an opportunity to ask questions and all were answered. The patient agreed with the plan and demonstrated an understanding of the instructions. The patient was advised to call back or seek an in-person office evaluation/go to MAU at [facility] for any urgent or concerning symptoms. Please refer to After Visit Summary for other counseling recommendations.   I provided 7 minutes of non-face-to-face time during this encounter.  Return in about 3 weeks (around 10/27/2018) for Return OB visit, 2hr GTT and labs.  Future Appointments  Date Time Provider Department Center  10/21/2018  2:15 PM WH-MFC [place] 4 WH-MFCUS MFC-US  10/27/2018  8:50 AM WOC-WOCA LAB WOC-WOCA WOC  10/27/2018  9:15 AM Constant, Gigi Gin, MD WOC-WOCA WOC    Rolm Bookbinder, CNM [facility] for [employer], [facility]

## 2018-10-06 NOTE — Progress Notes (Signed)
Pt educated on how to access Babyscripts and place BP value into

## 2018-10-21 ENCOUNTER — Encounter (HOSPITAL_COMMUNITY): Payer: Self-pay

## 2018-10-21 ENCOUNTER — Ambulatory Visit (HOSPITAL_COMMUNITY)
Admission: RE | Admit: 2018-10-21 | Discharge: 2018-10-21 | Disposition: A | Discharge status: Home / Self Care | Payer: Medicaid Other | Source: Ambulatory Visit | Attending: Obstetrics and Gynecology | Admitting: Obstetrics and Gynecology

## 2018-10-21 ENCOUNTER — Other Ambulatory Visit: Payer: Self-pay

## 2018-10-21 ENCOUNTER — Ambulatory Visit (HOSPITAL_COMMUNITY): Payer: Medicaid Other

## 2018-10-21 DIAGNOSIS — Z3A27 27 weeks gestation of pregnancy: Secondary | ICD-10-CM

## 2018-10-21 DIAGNOSIS — Z363 Encounter for antenatal screening for malformations: Secondary | ICD-10-CM | POA: Diagnosis present

## 2018-10-24 ENCOUNTER — Telehealth: Payer: Self-pay | Admitting: Obstetrics & Gynecology

## 2018-10-24 NOTE — Telephone Encounter (Signed)
First attempt- mobile- called requested to call the home number.  Second attempt -Called the patient to inform of upcoming visit. Received a message the caller you are trying to reach has a voicemail box that has not been setup yet.

## 2018-10-27 ENCOUNTER — Encounter: Payer: Self-pay | Admitting: Obstetrics and Gynecology

## 2018-10-27 ENCOUNTER — Other Ambulatory Visit: Payer: Self-pay

## 2018-10-27 ENCOUNTER — Ambulatory Visit (INDEPENDENT_AMBULATORY_CARE_PROVIDER_SITE_OTHER): Payer: Medicaid Other | Admitting: Obstetrics and Gynecology

## 2018-10-27 ENCOUNTER — Other Ambulatory Visit: Payer: Medicaid Other

## 2018-10-27 ENCOUNTER — Telehealth: Payer: Self-pay | Admitting: Obstetrics and Gynecology

## 2018-10-27 VITALS — BP 108/72 | HR 102 | Temp 98.0°F | Wt 131.0 lb

## 2018-10-27 DIAGNOSIS — Z3A28 28 weeks gestation of pregnancy: Secondary | ICD-10-CM

## 2018-10-27 DIAGNOSIS — Z3403 Encounter for supervision of normal first pregnancy, third trimester: Secondary | ICD-10-CM | POA: Diagnosis not present

## 2018-10-27 DIAGNOSIS — Z23 Encounter for immunization: Secondary | ICD-10-CM

## 2018-10-27 NOTE — Telephone Encounter (Signed)
The patient downloaded the [employer] webex app before leaving the office.

## 2018-10-27 NOTE — Progress Notes (Signed)
   PRENATAL VISIT NOTE  Subjective:  Elizabeth Shaffer is a 23 y.o. G1P0000 at [redacted]w[redacted]d being seen today for ongoing prenatal care.  She is currently monitored for the following issues for this low-risk pregnancy and has Supervision of low-risk first pregnancy and GERD (gastroesophageal reflux disease) on their problem list.  Patient reports episodes of lightheadedness since returning to work.  Contractions: Irritability. Vag. Bleeding: None.  Movement: Present. Denies leaking of fluid.   The following portions of the patient's history were reviewed and updated as appropriate: allergies, current medications, past family history, past medical history, past social history, past surgical history and problem list.   Objective:   Vitals:   10/27/18 0906  BP: 108/72  Pulse: (!) 102  Temp: 98 F (36.7 C)  Weight: 131 lb (59.4 kg)    Fetal Status: Fetal Heart Rate (bpm): 148 Fundal Height: 29 cm Movement: Present     General:  Alert, oriented and cooperative. Patient is in no acute distress.  Skin: Skin is warm and dry. No rash noted.   Cardiovascular: Normal heart rate noted  Respiratory: Normal respiratory effort, no problems with respiration noted  Abdomen: Soft, gravid, appropriate for gestational age.  Pain/Pressure: Present     Pelvic: Cervical exam deferred        Extremities: Normal range of motion.  Edema: Trace  Mental Status: Normal mood and affect. Normal behavior. Normal judgment and thought content.   Assessment and Plan:  Pregnancy: G1P0000 at [redacted]w[redacted]d 1. Encounter for supervision of low-risk first pregnancy in third trimester Patient is doing well  Advised patient to increase fluid intake and to always carry a snack with her Patient had breakfast this morning and will return next week for 2 hour glucola and third trimester labs Tdap today Patient plans birth control pills for contraception - Tdap vaccine greater than or equal to 7yo IM  Preterm labor symptoms and general  obstetric precautions including but not limited to vaginal bleeding, contractions, leaking of fluid and fetal movement were reviewed in detail with the patient. Please refer to After Visit Summary for other counseling recommendations.   Return in about 2 weeks (around 11/10/2018) for [facility], [facility].  Future Appointments  Date Time Provider Department Center  11/01/2018 10:00 AM [facility]    Catalina Antigua, MD

## 2018-10-27 NOTE — Progress Notes (Signed)
Pt states have been seeing stars,during  Her headaches, feeling week and dizzy to the point that she has to sit down. She states gets overheated also. Takes tylenol but doesn't really help.

## 2018-10-31 ENCOUNTER — Other Ambulatory Visit: Payer: Self-pay

## 2018-10-31 ENCOUNTER — Encounter (HOSPITAL_COMMUNITY): Payer: Self-pay | Admitting: *Deleted

## 2018-10-31 ENCOUNTER — Telehealth: Payer: Self-pay | Admitting: Obstetrics and Gynecology

## 2018-10-31 ENCOUNTER — Inpatient Hospital Stay (HOSPITAL_COMMUNITY)
Admission: AD | Admit: 2018-10-31 | Discharge: 2018-11-01 | Disposition: A | Discharge status: Home / Self Care | Payer: Medicaid Other | Attending: Obstetrics & Gynecology | Admitting: Obstetrics & Gynecology

## 2018-10-31 DIAGNOSIS — Z3A29 29 weeks gestation of pregnancy: Secondary | ICD-10-CM | POA: Insufficient documentation

## 2018-10-31 DIAGNOSIS — M549 Dorsalgia, unspecified: Secondary | ICD-10-CM | POA: Diagnosis not present

## 2018-10-31 DIAGNOSIS — O26899 Other specified pregnancy related conditions, unspecified trimester: Secondary | ICD-10-CM

## 2018-10-31 DIAGNOSIS — O26893 Other specified pregnancy related conditions, third trimester: Secondary | ICD-10-CM

## 2018-10-31 DIAGNOSIS — R109 Unspecified abdominal pain: Secondary | ICD-10-CM | POA: Insufficient documentation

## 2018-10-31 DIAGNOSIS — O9989 Other specified diseases and conditions complicating pregnancy, childbirth and the puerperium: Secondary | ICD-10-CM | POA: Diagnosis present

## 2018-10-31 DIAGNOSIS — Z3403 Encounter for supervision of normal first pregnancy, third trimester: Secondary | ICD-10-CM

## 2018-10-31 LAB — URINALYSIS, ROUTINE W REFLEX MICROSCOPIC
Bilirubin Urine: NEGATIVE
Glucose, UA: NEGATIVE mg/dL
Hgb urine dipstick: NEGATIVE
Ketones, ur: NEGATIVE mg/dL
Nitrite: NEGATIVE
Protein, ur: NEGATIVE mg/dL
Specific Gravity, Urine: 1.02 (ref 1.005–1.030)
pH: 8 (ref 5.0–8.0)

## 2018-10-31 MED ORDER — CYCLOBENZAPRINE HCL 10 MG PO TABS
10.0000 mg | ORAL_TABLET | Freq: Once | ORAL | Status: AC
  Administered 2018-10-31: 10 mg via ORAL
  Filled 2018-10-31: qty 1

## 2018-10-31 NOTE — Telephone Encounter (Signed)
The patient called in to come in earlier to start the 2-hour because she has to be at work at 12. Stated she would like to come in around 9am. Informed the patient we will do our best to get to her when she comes in however she may have to wait until her appointment time.

## 2018-10-31 NOTE — MAU Note (Signed)
ALSO FEELS NAUSEA-  COMES AND GOES- THIS TIME - STARTED THIS AM.- ZOFRAN AT HOME- DID NOT TAKE ANY. WAS ABLE TO EAT SOME

## 2018-10-31 NOTE — MAU Provider Note (Signed)
History     CSN: [id number]  Arrival date and time: 10/31/18 2145   First Provider Initiated Contact with Patient 10/31/18 2230      No chief complaint on file.  Landry DykeKendra Pupo is a 23 y.o. G1P0 at 5629w2d who presents for cramping, abdominal and back pain. She reports it started last night and has been constant, but a "mild cramp" in the middle of the abdomen.  Patient states the pain is at the location that her pants usually sit and she still wears her regular jeans and does not wear a belly support band.  She reports she attempted to have sex last night, but was unsuccessful because she states it "was really really tight."  She states the pain was present prior to this incident and it did not change. She currently rates the pain as a 7/10 and has not tried any medications.  She does not note any aggravating or relieving factors regarding her pain.  She endorses fetal movement and denies vaginal concerns.  However, she reports having some "faint pink discharge" this morning, but none for the remainder of the day.  She reports drinking ~2-3 bottles of water daily, but states she drinks more at work. Patient reports she works at [employer] and has worked 4-10 hours days.      OB History    Gravida  1   Para  0   Term  0   Preterm  0   AB  0   Living  0     SAB  0   TAB  0   Ectopic  0   Multiple  0   Live Births  0           Past Medical History:  Diagnosis Date  . GERD (gastroesophageal reflux disease)   . Headache     Past Surgical History:  Procedure Laterality Date  . MOUTH SURGERY    . MOUTH SURGERY  2011  . MULTIPLE TOOTH EXTRACTIONS    . NO PAST SURGERIES    . WISDOM TOOTH EXTRACTION      Family History  Problem Relation Age of Onset  . Hypertension Mother     Social History   Tobacco Use  . Smoking status: Never Smoker  . Smokeless tobacco: Never Used  Substance Use Topics  . Alcohol use: Not Currently    Comment: on occ  . Drug use: Yes     Types: Marijuana    Comment: none since November 2019    Allergies: No Known Allergies  Medications Prior to Admission  Medication Sig Dispense Refill Last Dose  . dicyclomine (BENTYL) 10 MG capsule Take 1 capsule (10 mg total) by mouth 3 (three) times daily as needed for spasms. (Patient not taking: Reported on 10/06/2018) 10 capsule 0 Not Taking  . famotidine (PEPCID) 20 MG tablet Take 1 tablet (20 mg total) by mouth 2 (two) times daily. 60 tablet 0 More than a month at Unknown time  . ondansetron (ZOFRAN-ODT) 8 MG disintegrating tablet DISSOLVE 1 TABLET(8 MG) ON THE TONGUE EVERY 8 HOURS AS NEEDED FOR NAUSEA OR VOMITING 20 tablet 2 10/30/2018  . Prenatal Multivit-Min-Fe-FA (PRENATAL VITAMINS) 0.8 MG tablet Take 1 tablet by mouth daily. 30 tablet 12 10/30/2018  . promethazine (PHENERGAN) 25 MG tablet Take 1 tablet (25 mg total) by mouth every 6 (six) hours as needed for nausea or vomiting. (Patient not taking: Reported on 10/06/2018) 30 tablet 2 10/24/2018    Review of Systems  Constitutional: Negative for chills and fever.  Respiratory: Negative for cough and shortness of breath.   Gastrointestinal: Positive for abdominal pain and nausea. Negative for constipation, diarrhea and vomiting.  Genitourinary: Negative for dysuria, vaginal bleeding and vaginal discharge.  Musculoskeletal: Positive for back pain.  Neurological: Negative for dizziness, light-headedness and headaches.   Physical Exam   Blood pressure 112/68, pulse 95, temperature 98.3 F (36.8 C), temperature source Oral, resp. rate 20, height 5\' 1"  (1.549 m), weight 59.9 kg, last menstrual period 03/16/2018.  Physical Exam  Constitutional: She is oriented to person, place, and time. She appears well-developed and well-nourished. No distress.  HENT:  Head: Normocephalic and atraumatic.  Eyes: Conjunctivae are normal.  Neck: Normal range of motion.  Cardiovascular: Normal rate, regular rhythm and normal heart sounds.   Respiratory: Effort normal and breath sounds normal.  GI: Soft. Bowel sounds are normal.  Gravid--fundal height appears AGA, Soft, NT   Genitourinary:    Vaginal discharge present.     No vaginal bleeding.  No bleeding in the vagina.    Genitourinary Comments: Sterile Speculum Exam: -Vaginal Vault: Pink mucosa. Small amt white mucoid discharge in posterior fornix -wet prep collected -Cervix:Pink, no lesions, cysts, or polyps.  Appears closed. No active bleeding or discharge from os-GC/CT collected -Bimanual Exam: Closed/Long   Neurological: She is alert and oriented to person, place, and time.  Skin: Skin is warm and dry.  Psychiatric: She has a normal mood and affect. Her behavior is normal.    Dilation: Closed Effacement (%): Thick Cervical Position: Posterior Station: Ballotable Presentation: Undeterminable Exam by:: J.Holliday Sheaffer, CNM  Fetal Assessment 145 bpm, Mod Var, -Decels, +Accels Toco: None graphed  MAU Course   Results for orders placed or performed during the hospital encounter of 10/31/18 (from the past 24 hour(s))  Urinalysis, Routine w reflex microscopic     Status: Abnormal   Collection Time: 10/31/18 10:21 PM  Result Value Ref Range   Color, Urine YELLOW YELLOW   APPearance HAZY (A) CLEAR   Specific Gravity, Urine 1.020 1.005 - 1.030   pH 8.0 5.0 - 8.0   Glucose, UA NEGATIVE NEGATIVE mg/dL   Hgb urine dipstick NEGATIVE NEGATIVE   Bilirubin Urine NEGATIVE NEGATIVE   Ketones, ur NEGATIVE NEGATIVE mg/dL   Protein, ur NEGATIVE NEGATIVE mg/dL   Nitrite NEGATIVE NEGATIVE   Leukocytes,Ua TRACE (A) NEGATIVE   RBC / HPF 0-5 0 - 5 RBC/hpf   WBC, UA 0-5 0 - 5 WBC/hpf   Bacteria, UA RARE (A) NONE SEEN   Squamous Epithelial / LPF 6-10 0 - 5   Mucus PRESENT   Wet prep, genital     Status: Abnormal   Collection Time: 10/31/18 10:52 PM  Result Value Ref Range   Yeast Wet Prep HPF POC NONE SEEN NONE SEEN   Trich, Wet Prep NONE SEEN NONE SEEN   Clue Cells Wet Prep HPF  POC NONE SEEN NONE SEEN   WBC, Wet Prep HPF POC FEW (A) NONE SEEN   Sperm NONE SEEN    No results found.  MDM PE Labs: UA, Wet Prep, GC/CT EFM Pain Medication Sleep Aide Assessment and Plan  23 year old, G1P0 SIUP at 29.2 weeks Cat I FT Abdominal Cramping Back Pain  -Exam findings discussed -NST reactive -Cultures Sent -Will give oral hydration -Will give flexeril 10mg  now -Will reassess  Follow Up (12:17 AM)  -Patient states pain was initially approved, but has now returned with removal of her EFM.  -Informed  that pain may be related to muscular pain s/t restrictive clothing.  -Discussed usage of belly band or belt as well as obtaining maternity belt.  -Encouraged increased hydration throughout day from 2-3 bottles to 5-6 bottles. -Discussed sleep aide and pain medication at home. -Offered and accepts Tylenol PM; Given Tylenol XR and Benadryl   -Questions if she should go to work tomorrow;. Instructed to stay home and rest after scheduled OB appt. -PTL Precautions given. -No questions or concerns. -Encouraged to call or return to MAU if symptoms worsen or with the onset of new symptoms. -Nurse instructed to give OOW excuse for tomorrow. -Discharged to home in stable condition  Cherre Robins MSN, CNM 10/31/2018, 10:30 PM

## 2018-10-31 NOTE — MAU Note (Signed)
PT SAYS THIS AM SHE HAD LIGHT PINK D/C- NO MORE  TODAY.   BACK PAIN AND ABD STARTED 10PM LAST NIGHT .  [facility] WITH CLINIC.  LAST SEX- LAST NIGHT .

## 2018-11-01 ENCOUNTER — Encounter: Payer: Self-pay | Admitting: Student

## 2018-11-01 ENCOUNTER — Telehealth: Payer: Self-pay | Admitting: Student

## 2018-11-01 ENCOUNTER — Other Ambulatory Visit: Payer: Medicaid Other

## 2018-11-01 DIAGNOSIS — R109 Unspecified abdominal pain: Secondary | ICD-10-CM

## 2018-11-01 DIAGNOSIS — M549 Dorsalgia, unspecified: Secondary | ICD-10-CM

## 2018-11-01 DIAGNOSIS — O26893 Other specified pregnancy related conditions, third trimester: Secondary | ICD-10-CM

## 2018-11-01 DIAGNOSIS — Z3403 Encounter for supervision of normal first pregnancy, third trimester: Secondary | ICD-10-CM

## 2018-11-01 DIAGNOSIS — Z3A29 29 weeks gestation of pregnancy: Secondary | ICD-10-CM

## 2018-11-01 LAB — WET PREP, GENITAL
Clue Cells Wet Prep HPF POC: NONE SEEN
Sperm: NONE SEEN
Trich, Wet Prep: NONE SEEN
Yeast Wet Prep HPF POC: NONE SEEN

## 2018-11-01 LAB — GC/CHLAMYDIA PROBE AMP (~~LOC~~) NOT AT ARMC
Chlamydia: NEGATIVE
Neisseria Gonorrhea: NEGATIVE

## 2018-11-01 MED ORDER — ACETAMINOPHEN 500 MG PO TABS
1000.0000 mg | ORAL_TABLET | Freq: Once | ORAL | Status: AC
  Administered 2018-11-01: 1000 mg via ORAL
  Filled 2018-11-01: qty 2

## 2018-11-01 MED ORDER — DIPHENHYDRAMINE HCL 25 MG PO CAPS
50.0000 mg | ORAL_CAPSULE | Freq: Once | ORAL | Status: AC
  Administered 2018-11-01: 50 mg via ORAL
  Filled 2018-11-01: qty 2

## 2018-11-01 NOTE — Discharge Instructions (Signed)
Back Pain in Pregnancy  Back pain during pregnancy is common. Back pain may be caused by several factors that are related to changes during your pregnancy.  Follow these instructions at home:  Managing pain, stiffness, and swelling          If directed, for sudden (acute) back pain, put ice on the painful area.  ? Put ice in a plastic bag.  ? Place a towel between your skin and the bag.  ? Leave the ice on for 20 minutes, 2-3 times per day.   If directed, apply heat to the affected area before you exercise. Use the heat source that your health care provider recommends, such as a moist heat pack or a heating pad.  ? Place a towel between your skin and the heat source.  ? Leave the heat on for 20-30 minutes.  ? Remove the heat if your skin turns bright red. This is especially important if you are unable to feel pain, heat, or cold. You may have a greater risk of getting burned.   If directed, massage the affected area.  Activity   Exercise as told by your health care provider. Gentle exercise is the best way to prevent or manage back pain.   Listen to your body when lifting. If lifting hurts, ask for help or bend your knees. This uses your leg muscles instead of your back muscles.   Squat down when picking up something from the floor. Do not bend over.   Only use bed rest for short periods as told by your health care provider. Bed rest should only be used for the most severe episodes of back pain.  Standing, sitting, and lying down   Do not stand in one place for long periods of time.   Use good posture when sitting. Make sure your head rests over your shoulders and is not hanging forward. Use a pillow on your lower back if necessary.   Try sleeping on your side, preferably the left side, with a pregnancy support pillow or 1-2 regular pillows between your legs.  ? If you have back pain after a night's rest, your bed may be too soft.  ? A firm mattress may provide more support for your back during  pregnancy.  General instructions   Do not wear high heels.   Eat a healthy diet. Try to gain weight within your health care provider's recommendations.   Use a maternity girdle, elastic sling, or back brace as told by your health care provider.   Take over-the-counter and prescription medicines only as told by your health care provider.   Work with a physical therapist or massage therapist to find ways to manage back pain. Acupuncture or massage therapy may be helpful.   Keep all follow-up visits as told by your health care provider. This is important.  Contact a health care provider if:   Your back pain interferes with your daily activities.   You have increasing pain in other parts of your body.  Get help right away if:   You develop numbness, tingling, weakness, or problems with the use of your arms or legs.   You develop severe back pain that is not controlled with medicine.   You have a change in bowel or bladder control.   You develop shortness of breath, dizziness, or you faint.   You develop nausea, vomiting, or sweating.   You have back pain that is a rhythmic, cramping pain similar to labor pains. Labor   pain is usually 1-2 minutes apart, lasts for about 1 minute, and involves a bearing down feeling or pressure in your pelvis.   You have back pain and your water breaks or you have vaginal bleeding.   You have back pain or numbness that travels down your leg.   Your back pain developed after you fell.   You develop pain on one side of your back.   You see blood in your urine.   You develop skin blisters in the area of your back pain.  Summary   Back pain may be caused by several factors that are related to changes during your pregnancy.   Follow instructions as told by your health care provider for managing pain, stiffness, and swelling.   Exercise as told by your health care provider. Gentle exercise is the best way to prevent or manage back pain.   Take over-the-counter and  prescription medicines only as told by your health care provider.   Keep all follow-up visits as told by your health care provider. This is important.  This information is not intended to replace advice given to you by your health care provider. Make sure you discuss any questions you have with your health care provider.  Document Released: 09/09/2005 Document Revised: 11/17/2017 Document Reviewed: 11/17/2017  Elsevier Interactive Patient Education  2019 Elsevier Inc[employer]

## 2018-11-02 ENCOUNTER — Encounter: Payer: Self-pay | Admitting: Student

## 2018-11-02 ENCOUNTER — Other Ambulatory Visit: Payer: Self-pay | Admitting: Student

## 2018-11-02 DIAGNOSIS — O99013 Anemia complicating pregnancy, third trimester: Secondary | ICD-10-CM | POA: Insufficient documentation

## 2018-11-02 LAB — CBC
Hematocrit: 30.6 % — ABNORMAL LOW (ref 34.0–46.6)
Hemoglobin: 10.9 g/dL — ABNORMAL LOW (ref 11.1–15.9)
MCH: 32.8 pg (ref 26.6–33.0)
MCHC: 35.6 g/dL (ref 31.5–35.7)
MCV: 92 fL (ref 79–97)
Platelets: 168 10*3/uL (ref 150–450)
RBC: 3.32 x10E6/uL — ABNORMAL LOW (ref 3.77–5.28)
RDW: 11.9 % (ref 11.7–15.4)
WBC: 6.3 10*3/uL (ref 3.4–10.8)

## 2018-11-02 LAB — GLUCOSE TOLERANCE, 2 HOURS W/ 1HR
Glucose, 1 hour: 127 mg/dL (ref 65–179)
Glucose, 2 hour: 100 mg/dL (ref 65–152)
Glucose, Fasting: 72 mg/dL (ref 65–91)

## 2018-11-02 LAB — RPR: RPR Ser Ql: NONREACTIVE

## 2018-11-02 LAB — HIV ANTIBODY (ROUTINE TESTING W REFLEX): HIV Screen 4th Generation wRfx: NONREACTIVE

## 2018-11-02 MED ORDER — FERROUS FUMARATE 325 (106 FE) MG PO TABS: 1.0000 | ORAL_TABLET | Freq: Every day | ORAL | 3 refills | Status: DC

## 2018-11-10 ENCOUNTER — Other Ambulatory Visit: Payer: Self-pay

## 2018-11-10 ENCOUNTER — Ambulatory Visit (INDEPENDENT_AMBULATORY_CARE_PROVIDER_SITE_OTHER): Payer: Medicaid Other | Admitting: Obstetrics and Gynecology

## 2018-11-10 DIAGNOSIS — Z3A30 30 weeks gestation of pregnancy: Secondary | ICD-10-CM

## 2018-11-10 DIAGNOSIS — Z3403 Encounter for supervision of normal first pregnancy, third trimester: Secondary | ICD-10-CM

## 2018-11-10 NOTE — Progress Notes (Signed)
   TELEHEALTH VIRTUAL OBSTETRICS VISIT ENCOUNTER NOTE  I connected with Elizabeth Shaffer on 11/10/18 at  2:15 PM EDT by webex at home and verified that I am speaking with the correct person using two identifiers.   I discussed the limitations, risks, security and privacy concerns of performing an evaluation and management service by telephone and the availability of in person appointments. I also discussed with the patient that there may be a patient responsible charge related to this service. The patient expressed understanding and agreed to proceed.  Subjective:  Ane Method is a 23 y.o. G1P0000 at [redacted]w[redacted]d being followed for ongoing prenatal care.  She is currently monitored for the following issues for this low-risk pregnancy and has Supervision of low-risk first pregnancy; GERD (gastroesophageal reflux disease); and Anemia affecting pregnancy in third trimester on their problem list.  Patient reports Round ligament pain, Sciatic nerve pain.. Reports fetal movement. Denies any contractions, bleeding or leaking of fluid.   The following portions of the patient's history were reviewed and updated as appropriate: allergies, current medications, past family history, past medical history, past social history, past surgical history and problem list.   Objective:   General:  Alert, oriented and cooperative.   Mental Status: Normal mood and affect perceived. Normal judgment and thought content.  Rest of physical exam deferred due to type of encounter  Assessment and Plan:  Pregnancy: G1P0000 at [redacted]w[redacted]d  1. Encounter for supervision of low-risk first pregnancy in third trimester  Doing well Pregnancy support belt recommended.  36 week in office visit discussed.    There are no diagnoses linked to this encounter. Preterm labor symptoms and general obstetric precautions including but not limited to vaginal bleeding, contractions, leaking of fluid and fetal movement were reviewed in detail with the  patient.  I discussed the assessment and treatment plan with the patient. The patient was provided an opportunity to ask questions and all were answered. The patient agreed with the plan and demonstrated an understanding of the instructions. The patient was advised to call back or seek an in-person office evaluation/go to [facility]U at [facility] for any urgent or concerning symptoms. Please refer to After Visit Summary for other counseling recommendations.   I provided 11 minutes of non-face-to-face time during this encounter.  Return in about 3 weeks (around 12/01/2018) for Virtual visit.  No future appointments.  Venia Carbon, NP [facility] [employer], [facility]

## 2018-11-10 NOTE — Progress Notes (Signed)
I connected with  Elizabeth Shaffer on 11/10/18 at  2:15 PM EDT by telephone and verified that I am speaking with the correct person using two identifiers.   I discussed the limitations, risks, security and privacy concerns of performing an evaluation and management service by telephone and the availability of in person appointments. I also discussed with the patient that there may be a patient responsible charge related to this service. The patient expressed understanding and agreed to proceed.  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

## 2018-11-26 ENCOUNTER — Encounter (HOSPITAL_COMMUNITY): Payer: Self-pay

## 2018-11-26 ENCOUNTER — Inpatient Hospital Stay (HOSPITAL_COMMUNITY)
Admission: AD | Admit: 2018-11-26 | Discharge: 2018-11-27 | Disposition: A | Discharge status: Home / Self Care | Payer: Medicaid Other | Attending: Obstetrics and Gynecology | Admitting: Obstetrics and Gynecology

## 2018-11-26 ENCOUNTER — Other Ambulatory Visit: Payer: Self-pay

## 2018-11-26 DIAGNOSIS — O26893 Other specified pregnancy related conditions, third trimester: Secondary | ICD-10-CM | POA: Diagnosis not present

## 2018-11-26 DIAGNOSIS — Z3A33 33 weeks gestation of pregnancy: Secondary | ICD-10-CM

## 2018-11-26 DIAGNOSIS — R0602 Shortness of breath: Secondary | ICD-10-CM | POA: Diagnosis not present

## 2018-11-26 DIAGNOSIS — O4703 False labor before 37 completed weeks of gestation, third trimester: Secondary | ICD-10-CM | POA: Insufficient documentation

## 2018-11-26 DIAGNOSIS — O479 False labor, unspecified: Secondary | ICD-10-CM

## 2018-11-26 DIAGNOSIS — R1030 Lower abdominal pain, unspecified: Secondary | ICD-10-CM | POA: Diagnosis present

## 2018-11-26 DIAGNOSIS — Z1159 Encounter for screening for other viral diseases: Secondary | ICD-10-CM | POA: Diagnosis not present

## 2018-11-26 LAB — URINALYSIS, ROUTINE W REFLEX MICROSCOPIC
Bilirubin Urine: NEGATIVE
Glucose, UA: NEGATIVE mg/dL
Hgb urine dipstick: NEGATIVE
Ketones, ur: 5 mg/dL — AB
Leukocytes,Ua: NEGATIVE
Nitrite: NEGATIVE
Protein, ur: 30 mg/dL — AB
Specific Gravity, Urine: 1.031 — ABNORMAL HIGH (ref 1.005–1.030)
pH: 5 (ref 5.0–8.0)

## 2018-11-26 LAB — CBC WITH DIFFERENTIAL/PLATELET
Abs Immature Granulocytes: 0.04 10*3/uL (ref 0.00–0.07)
Basophils Absolute: 0 10*3/uL (ref 0.0–0.1)
Basophils Relative: 0 %
Eosinophils Absolute: 0 10*3/uL (ref 0.0–0.5)
Eosinophils Relative: 0 %
HCT: 29 % — ABNORMAL LOW (ref 36.0–46.0)
Hemoglobin: 9.9 g/dL — ABNORMAL LOW (ref 12.0–15.0)
Immature Granulocytes: 1 %
Lymphocytes Relative: 28 %
Lymphs Abs: 2.4 10*3/uL (ref 0.7–4.0)
MCH: 32.2 pg (ref 26.0–34.0)
MCHC: 34.1 g/dL (ref 30.0–36.0)
MCV: 94.5 fL (ref 80.0–100.0)
Monocytes Absolute: 0.9 10*3/uL (ref 0.1–1.0)
Monocytes Relative: 11 %
Neutro Abs: 5.2 10*3/uL (ref 1.7–7.7)
Neutrophils Relative %: 60 %
Platelets: 178 10*3/uL (ref 150–400)
RBC: 3.07 MIL/uL — ABNORMAL LOW (ref 3.87–5.11)
RDW: 12.5 % (ref 11.5–15.5)
WBC: 8.6 10*3/uL (ref 4.0–10.5)
nRBC: 0 % (ref 0.0–0.2)

## 2018-11-26 LAB — SARS CORONAVIRUS 2: SARS Coronavirus 2: NOT DETECTED

## 2018-11-26 LAB — FETAL FIBRONECTIN: Fetal Fibronectin: NEGATIVE

## 2018-11-26 MED ORDER — LACTATED RINGERS IV SOLN
INTRAVENOUS | Status: DC
  Administered 2018-11-26: 23:00:00 via INTRAVENOUS

## 2018-11-26 MED ORDER — NIFEDIPINE 10 MG PO CAPS
10.0000 mg | ORAL_CAPSULE | ORAL | Status: AC
  Administered 2018-11-26 (×3): 10 mg via ORAL
  Filled 2018-11-26 (×3): qty 1

## 2018-11-26 MED ORDER — LACTATED RINGERS IV BOLUS
1000.0000 mL | Freq: Once | INTRAVENOUS | Status: AC
  Administered 2018-11-26: 1000 mL via INTRAVENOUS

## 2018-11-26 NOTE — Discharge Instructions (Signed)
Braxton Hicks Contractions Contractions of the uterus can occur throughout pregnancy, but they are not always a sign that you are in labor. You may have practice contractions called Braxton Hicks contractions. These false labor contractions are sometimes confused with true labor. What are Braxton Hicks contractions? Braxton Hicks contractions are tightening movements that occur in the muscles of the uterus before labor. Unlike true labor contractions, these contractions do not result in opening (dilation) and thinning of the cervix. Toward the end of pregnancy (32-34 weeks), Braxton Hicks contractions can happen more often and may become stronger. These contractions are sometimes difficult to tell apart from true labor because they can be very uncomfortable. You should not feel embarrassed if you go to the hospital with false labor. Sometimes, the only way to tell if you are in true labor is for your health care provider to look for changes in the cervix. The health care provider will do a physical exam and may monitor your contractions. If you are not in true labor, the exam should show that your cervix is not dilating and your water has not broken. If there are no other health problems associated with your pregnancy, it is completely safe for you to be sent home with false labor. You may continue to have Braxton Hicks contractions until you go into true labor. How to tell the difference between true labor and false labor True labor  Contractions last 30-70 seconds.  Contractions become very regular.  Discomfort is usually felt in the top of the uterus, and it spreads to the lower abdomen and low back.  Contractions do not go away with walking.  Contractions usually become more intense and increase in frequency.  The cervix dilates and gets thinner. False labor  Contractions are usually shorter and not as strong as true labor contractions.  Contractions are usually irregular.  Contractions  are often felt in the front of the lower abdomen and in the groin.  Contractions may go away when you walk around or change positions while lying down.  Contractions get weaker and are shorter-lasting as time goes on.  The cervix usually does not dilate or become thin. Follow these instructions at home:   Take over-the-counter and prescription medicines only as told by your health care provider.  Keep up with your usual exercises and follow other instructions from your health care provider.  Eat and drink lightly if you think you are going into labor.  If Braxton Hicks contractions are making you uncomfortable: ? Change your position from lying down or resting to walking, or change from walking to resting. ? Sit and rest in a tub of warm water. ? Drink enough fluid to keep your urine pale yellow. Dehydration may cause these contractions. ? Do slow and deep breathing several times an hour.  Keep all follow-up prenatal visits as told by your health care provider. This is important. Contact a health care provider if:  You have a fever.  You have continuous pain in your abdomen. Get help right away if:  Your contractions become stronger, more regular, and closer together.  You have fluid leaking or gushing from your vagina.  You pass blood-tinged mucus (bloody show).  You have bleeding from your vagina.  You have low back pain that you never had before.  You feel your baby's head pushing down and causing pelvic pressure.  Your baby is not moving inside you as much as it used to. Summary  Contractions that occur before labor are   called Braxton Hicks contractions, false labor, or practice contractions.  Braxton Hicks contractions are usually shorter, weaker, farther apart, and less regular than true labor contractions. True labor contractions usually become progressively stronger and regular, and they become more frequent.  Manage discomfort from Braxton Hicks contractions  by changing position, resting in a warm bath, drinking plenty of water, or practicing deep breathing. This information is not intended to replace advice given to you by your health care provider. Make sure you discuss any questions you have with your health care provider. Document Released: 10/15/2016 Document Revised: 03/16/2017 Document Reviewed: 10/15/2016 Elsevier Interactive Patient Education  2019 Elsevier Inc.  

## 2018-11-26 NOTE — MAU Provider Note (Signed)
History     CSN: [id number]  Arrival date and time: 11/26/18 2050   First Provider Initiated Contact with Patient 11/26/18 2137      Chief Complaint  Patient presents with  . Abdominal Pain  . Back Pain  . Pelvic Pain   HPI   [name] is a 23 y.o.female G1P0000 @ 9815w0d here in MAU with lower abdominal pain, lower back pain and SOB. The symptoms started last Sunday, however the symptoms got worse today. Says she feels the pain in her belly often, it comes and goes. The pain in her back is lower and bilateral. That pain also comes and goes. She has not taken anything for the pain.  No bleeding.   SOB is all the time. Just sitting still she feels short of breath. She works at [employer], she is on her feet a lot during the day. She is asking for a note that says she can have breaks while working.  She has no chest pain,  And does not recall direct exposure to Covid. She has no fever, no changes in food tasting, or smell.   OB History    Gravida  1   Para  0   Term  0   Preterm  0   AB  0   Living  0     SAB  0   TAB  0   Ectopic  0   Multiple  0   Live Births  0           Past Medical History:  Diagnosis Date  . GERD (gastroesophageal reflux disease)   . Headache     Past Surgical History:  Procedure Laterality Date  . MOUTH SURGERY    . MOUTH SURGERY  2011  . MULTIPLE TOOTH EXTRACTIONS    . NO PAST SURGERIES    . WISDOM TOOTH EXTRACTION      Family History  Problem Relation Age of Onset  . Hypertension Mother     Social History   Tobacco Use  . Smoking status: Never Smoker  . Smokeless tobacco: Never Used  Substance Use Topics  . Alcohol use: Not Currently    Comment: on occ  . Drug use: Not Currently    Types: Marijuana    Comment: none since November 2019    Allergies: No Known Allergies  No medications prior to admission.   Results for orders placed or performed during the hospital encounter of 11/26/18 (from the past 48  hour(s))  Fetal fibronectin     Status: None   Collection Time: 11/26/18  9:06 PM  Result Value Ref Range   Fetal Fibronectin NEGATIVE NEGATIVE    Comment: Performed at [facility], [address] [place], [place] [id number]  Urinalysis, Routine w reflex microscopic     Status: Abnormal   Collection Time: 11/26/18  9:51 PM  Result Value Ref Range   Color, Urine YELLOW YELLOW   APPearance HAZY (A) CLEAR   Specific Gravity, Urine 1.031 (H) 1.005 - 1.030   pH 5.0 5.0 - 8.0   Glucose, UA NEGATIVE NEGATIVE mg/dL   Hgb urine dipstick NEGATIVE NEGATIVE   Bilirubin Urine NEGATIVE NEGATIVE   Ketones, ur 5 (A) NEGATIVE mg/dL   Protein, ur 30 (A) NEGATIVE mg/dL   Nitrite NEGATIVE NEGATIVE   Leukocytes,Ua NEGATIVE NEGATIVE   RBC / HPF 0-5 0 - 5 RBC/hpf   WBC, UA 6-10 0 - 5 WBC/hpf   Bacteria, UA RARE (A)  NONE SEEN   Squamous Epithelial / LPF 0-5 0 - 5   Mucus PRESENT     Comment: Performed at [facility], [address] [place], [place] [address]  SARS Coronavirus 2     Status: None   Collection Time: 11/26/18 10:24 PM  Result Value Ref Range   SARS Coronavirus 2 NOT DETECTED NOT DETECTED    Comment: (NOTE) SARS-CoV-2 target nucleic acids are NOT DETECTED. The SARS-CoV-2 RNA is generally detectable in upper and lower respiratory specimens during the acute phase of infection.  Negative  results do not preclude SARS-CoV-2 infection, do not rule out co-infections with other pathogens, and should not be used as the sole basis for treatment or other patient management decisions.  Negative results must be combined with clinical observations, patient history, and epidemiological information. The expected result is Not Detected. Fact Sheet for Patients: http://www.biofiredefense.com/wp-content/uploads/2020/03/BIOFIRE-COVID -19-patients.pdf Fact Sheet for Healthcare Providers: http://www.biofiredefense.com/wp-content/uploads/2020/03/BIOFIRE-COVID -19-hcp.pdf This test is not  yet approved or cleared by the [place] and  has been authorized for detection and/or diagnosis of SARS-CoV-2 by FDA under an Emergency Use Authorization (EUA).  This EUA will remain in effec t (meaning this test can be used) for the duration of  the COVID-19 declaration under Section 564(b)(1) of the Act, 21 U.S.C. section 360bbb-3(b)(1), unless the authorization is terminated or revoked sooner. Performed at [facility], [place] [address], [place], [place] [address]   CBC with Differential/Platelet     Status: Abnormal   Collection Time: 11/26/18 10:37 PM  Result Value Ref Range   WBC 8.6 4.0 - 10.5 K/uL   RBC 3.07 (L) 3.87 - 5.11 MIL/uL   Hemoglobin 9.9 (L) 12.0 - 15.0 g/dL   HCT 29.0 (L) 36.0 - 46.0 %   MCV 94.5 80.0 - 100.0 fL   MCH 32.2 26.0 - 34.0 pg   MCHC 34.1 30.0 - 36.0 g/dL   RDW 12.5 11.5 - 15.5 %   Platelets 178 150 - 400 K/uL   nRBC 0.0 0.0 - 0.2 %   Neutrophils Relative % 60 %   Neutro Abs 5.2 1.7 - 7.7 K/uL   Lymphocytes Relative 28 %   Lymphs Abs 2.4 0.7 - 4.0 K/uL   Monocytes Relative 11 %   Monocytes Absolute 0.9 0.1 - 1.0 K/uL   Eosinophils Relative 0 %   Eosinophils Absolute 0.0 0.0 - 0.5 K/uL   Basophils Relative 0 %   Basophils Absolute 0.0 0.0 - 0.1 K/uL   Immature Granulocytes 1 %   Abs Immature Granulocytes 0.04 0.00 - 0.07 K/uL    Comment: Performed at [facility], [address], [place], [place] [address]   Review of Systems  Constitutional: Negative for fever.  Respiratory: Positive for shortness of breath. Negative for cough and chest tightness.   Cardiovascular: Positive for leg swelling. Negative for chest pain.  Genitourinary: Positive for pelvic pain.  Musculoskeletal: Positive for back pain.   Physical Exam   Blood pressure 131/77, pulse 91, temperature 98 F (36.7 C), temperature source Oral, resp. rate 16, weight 62.9 kg, last menstrual period 03/16/2018, SpO2 100 %.  Physical Exam  Constitutional: She is  oriented to person, place, and time. She appears well-developed and well-nourished.  Cardiovascular: Normal rate.  Respiratory: Effort normal and breath sounds normal. No respiratory distress. She has no wheezes. She has no rales. She exhibits no tenderness.  GI: Soft. There is abdominal tenderness in the right lower quadrant, suprapubic area  and left lower quadrant. There is no rigidity and no guarding.  Genitourinary:    Genitourinary Comments: Dilation: Closed Effacement (%): Thick Cervical Position: Posterior Presentation: Vertex Exam by:: Venia CarbonJennifer Dezyre Hoefer NP   Musculoskeletal: Normal range of motion.        General: Edema present.  Neurological: She is alert and oriented to person, place, and time.  Reflex Scores:      Patellar reflexes are 1+ on the right side and 1+ on the left side. Skin: Skin is warm.  Psychiatric: Her behavior is normal.   Fetal Tracing: Baseline: 145 bpm Variability: Moderate  Accelerations: 15x15 Decelerations: None Toco: frequent, irregular pattern   MAU Course  Procedures  None  MDM  Having irregular frequent contractions on the monitor.  FFN negative LR bolus X 1 CBC stable  UA with urine culture Covid test: Negative  Procardia X 3 doses  Patient feeling much better after medications and fluids. Cervix unchanged from previous exam.   Assessment and Plan   A:  1. Braxton Hicks contractions   2. Shortness of breath due to pregnancy in third trimester   3. [redacted] weeks gestation of pregnancy     P:  Discharge home with strict return precautions Return to MAU if symptoms worse Preterm labor precautions Work note given   Venia Carbonasch, Meilani Edmundson I, NP 11/27/2018 11:19 AM]

## 2018-11-26 NOTE — MAU Note (Signed)
Pt reports to MAU c/o lower abdominal pain, lower back pain, and lower pelvic pain. Pt states it is dull and intermittent. Pt states the pain is a 7/10 on the pain scale. Pt denies vaginal bleeding or LOF. +FM. Pt reports some swelling in her feet. Pt reports she wears a  Maternity belt. Pt states she is SOB that started with the pain on Sunday. No other COVID symptoms pt works at [employer] but states she has not been exposed at work pt states she wears a mask and gloves.

## 2018-11-28 LAB — CULTURE, OB URINE
Culture: NO GROWTH
Special Requests: NORMAL

## 2018-11-28 NOTE — Telephone Encounter (Signed)
Called the patient to inform of the express breast milk, one support person, face mask, and sanitizing hands once she enters the clinic.

## 2018-11-29 ENCOUNTER — Telehealth: Payer: Self-pay

## 2018-11-29 ENCOUNTER — Encounter (HOSPITAL_COMMUNITY): Payer: Self-pay

## 2018-11-29 ENCOUNTER — Other Ambulatory Visit: Payer: Self-pay

## 2018-11-29 ENCOUNTER — Inpatient Hospital Stay (HOSPITAL_COMMUNITY)
Admission: AD | Admit: 2018-11-29 | Discharge: 2018-11-29 | Disposition: A | Discharge status: Home / Self Care | Payer: Medicaid Other | Attending: Family Medicine | Admitting: Family Medicine

## 2018-11-29 DIAGNOSIS — Z3689 Encounter for other specified antenatal screening: Secondary | ICD-10-CM

## 2018-11-29 DIAGNOSIS — O368130 Decreased fetal movements, third trimester, not applicable or unspecified: Secondary | ICD-10-CM | POA: Diagnosis present

## 2018-11-29 DIAGNOSIS — O4703 False labor before 37 completed weeks of gestation, third trimester: Secondary | ICD-10-CM | POA: Diagnosis not present

## 2018-11-29 DIAGNOSIS — O479 False labor, unspecified: Secondary | ICD-10-CM

## 2018-11-29 DIAGNOSIS — O98813 Other maternal infectious and parasitic diseases complicating pregnancy, third trimester: Secondary | ICD-10-CM | POA: Diagnosis not present

## 2018-11-29 DIAGNOSIS — B373 Candidiasis of vulva and vagina: Secondary | ICD-10-CM | POA: Diagnosis not present

## 2018-11-29 DIAGNOSIS — B3731 Acute candidiasis of vulva and vagina: Secondary | ICD-10-CM

## 2018-11-29 DIAGNOSIS — Z3A33 33 weeks gestation of pregnancy: Secondary | ICD-10-CM | POA: Diagnosis not present

## 2018-11-29 LAB — URINALYSIS, ROUTINE W REFLEX MICROSCOPIC
Bilirubin Urine: NEGATIVE
Glucose, UA: NEGATIVE mg/dL
Hgb urine dipstick: NEGATIVE
Ketones, ur: NEGATIVE mg/dL
Nitrite: NEGATIVE
Protein, ur: 30 mg/dL — AB
Specific Gravity, Urine: 1.021 (ref 1.005–1.030)
pH: 6 (ref 5.0–8.0)

## 2018-11-29 LAB — WET PREP, GENITAL
Sperm: NONE SEEN
Trich, Wet Prep: NONE SEEN

## 2018-11-29 MED ORDER — NIFEDIPINE 10 MG PO CAPS
10.0000 mg | ORAL_CAPSULE | ORAL | Status: DC
  Administered 2018-11-29 (×2): 10 mg via ORAL
  Filled 2018-11-29 (×2): qty 1

## 2018-11-29 MED ORDER — TERCONAZOLE 0.4 % VA CREA: 1.0000 | TOPICAL_CREAM | Freq: Every day | VAGINAL | 0 refills | Status: AC

## 2018-11-29 NOTE — Telephone Encounter (Signed)
Called the patient to confirm the appointment. Received a message we're sorry the person you are trying to reach has a voicemail box that has not been setup yet. Good-bye. °

## 2018-11-29 NOTE — MAU Note (Signed)
Pt states that she was here on Saturday night for abdominal and back pain. Pt states she was told to come back if she had more pain or if the baby's movement decreased.   Pt reports decreased fetal movement that started yesterday. Pt reports her baby is normally really active, but yesterday and today there has not been much fetal movement.   Pt reports feeling the baby move now in MAU room.

## 2018-11-29 NOTE — MAU Provider Note (Signed)
History     CSN: [id number]  Arrival date and time: 11/29/18 1741   First Provider Initiated Contact with Patient 11/29/18 1817      Chief Complaint  Patient presents with  . Decreased Fetal Movement   Elizabeth Shaffer is a 23 y.o. G1P0000 at 555w3d who presents today with contractions and decreased fetal movement. She states that for the last 2 days she has not been feeling as much movement as she had been feeling. She states that the baby usually moves "all day". She is unsure of movement counts. She has not counted. She states that it just felt different.   Pelvic Pain The patient's primary symptoms include pelvic pain. The patient's pertinent negatives include no vaginal discharge. This is a new problem. The current episode started yesterday. The problem occurs intermittently. The problem affects both sides. She is pregnant. Pertinent negatives include no chills, dysuria, fever, frequency, nausea or vomiting. The vaginal discharge was normal. There has been no bleeding. Nothing aggravates the symptoms. She has tried nothing for the symptoms. Sexual activity: denies intercourse in the last 24 hours.     OB History    Gravida  1   Para  0   Term  0   Preterm  0   AB  0   Living  0     SAB  0   TAB  0   Ectopic  0   Multiple  0   Live Births  0           Past Medical History:  Diagnosis Date  . GERD (gastroesophageal reflux disease)   . Headache     Past Surgical History:  Procedure Laterality Date  . MOUTH SURGERY    . MOUTH SURGERY  2011  . MULTIPLE TOOTH EXTRACTIONS    . NO PAST SURGERIES    . WISDOM TOOTH EXTRACTION      Family History  Problem Relation Age of Onset  . Hypertension Mother     Social History   Tobacco Use  . Smoking status: Never Smoker  . Smokeless tobacco: Never Used  Substance Use Topics  . Alcohol use: Not Currently    Comment: on occ  . Drug use: Not Currently    Types: Marijuana    Comment: none since November 2019     Allergies: No Known Allergies  Medications Prior to Admission  Medication Sig Dispense Refill Last Dose  . ondansetron (ZOFRAN-ODT) 8 MG disintegrating tablet DISSOLVE 1 TABLET(8 MG) ON THE TONGUE EVERY 8 HOURS AS NEEDED FOR NAUSEA OR VOMITING 20 tablet 2 Past Month at Unknown time  . Prenatal Multivit-Min-Fe-FA (PRENATAL VITAMINS) 0.8 MG tablet Take 1 tablet by mouth daily. 30 tablet 12 11/29/2018 at Unknown time  . famotidine (PEPCID) 20 MG tablet Take 1 tablet (20 mg total) by mouth 2 (two) times daily. 60 tablet 0 More than a month at Unknown time  . ferrous fumarate (HEMOCYTE - 106 MG FE) 325 (106 Fe) MG TABS tablet Take 1 tablet (106 mg of iron total) by mouth daily. 30 each 3     Review of Systems  Constitutional: Negative for chills and fever.  Gastrointestinal: Negative for nausea and vomiting.  Genitourinary: Positive for pelvic pain. Negative for dysuria, frequency, vaginal bleeding and vaginal discharge.   Physical Exam   Blood pressure 115/71, pulse 91, temperature 98.2 F (36.8 C), resp. rate 20, weight 62.4 kg, last menstrual period 03/16/2018, SpO2 100 %.  Physical Exam  Nursing note and  vitals reviewed. Constitutional: She is oriented to person, place, and time. She appears well-developed and well-nourished. No distress.  HENT:  Head: Normocephalic.  Cardiovascular: Normal rate.  Respiratory: Effort normal.  GI: Soft. There is no abdominal tenderness. There is no rebound.  Genitourinary:    Genitourinary Comments:  External: no lesion Vagina: large amount of thick white discharge  Cervix: pink, smooth, closed/thick Uterus: AGA    Neurological: She is alert and oriented to person, place, and time.  Skin: Skin is warm and dry.  Psychiatric: She has a normal mood and affect.   NST:  Baseline: 140 Variability: moderate Accels: 15x15 Decels: none Toco: rare   Results for orders placed or performed during the hospital encounter of 11/29/18 (from the past  24 hour(s))  Urinalysis, Routine w reflex microscopic     Status: Abnormal   Collection Time: 11/29/18  6:18 PM  Result Value Ref Range   Color, Urine YELLOW YELLOW   APPearance HAZY (A) CLEAR   Specific Gravity, Urine 1.021 1.005 - 1.030   pH 6.0 5.0 - 8.0   Glucose, UA NEGATIVE NEGATIVE mg/dL   Hgb urine dipstick NEGATIVE NEGATIVE   Bilirubin Urine NEGATIVE NEGATIVE   Ketones, ur NEGATIVE NEGATIVE mg/dL   Protein, ur 30 (A) NEGATIVE mg/dL   Nitrite NEGATIVE NEGATIVE   Leukocytes,Ua SMALL (A) NEGATIVE   RBC / HPF 0-5 0 - 5 RBC/hpf   WBC, UA 0-5 0 - 5 WBC/hpf   Bacteria, UA RARE (A) NONE SEEN   Squamous Epithelial / LPF 0-5 0 - 5   Mucus PRESENT   Wet prep, genital     Status: Abnormal   Collection Time: 11/29/18  7:02 PM   Specimen: Cervix  Result Value Ref Range   Yeast Wet Prep HPF POC PRESENT (A) NONE SEEN   Trich, Wet Prep NONE SEEN NONE SEEN   Clue Cells Wet Prep HPF POC PRESENT (A) NONE SEEN   WBC, Wet Prep HPF POC MODERATE (A) NONE SEEN   Sperm NONE SEEN     MAU Course  Procedures  MDM  Patient feeling fetal movement while here. Cervix is closed and patient has a yeast infection. Will treat the yeast to see if this helps with her discomfort. She did a have a few contractions over several hours. She received procardia and contractions were no longer on tracing.   Assessment and Plan   1. NST (non-stress test) reactive   2. Yeast infection involving the vagina and surrounding area   3. [redacted] weeks gestation of pregnancy   4. Braxton Hicks contractions    DC home Comfort measures reviewed  3rd Trimester precautions  PTL precautions  Fetal kick counts RX: terazol 7 as directed  Return to MAU as needed FU with OB as planned  [place] for [facility] Follow up.   Specialty: Obstetrics and Gynecology Contact information: [address], [id number] [place]  [id number] [place] DNP, CNM  11/29/18  7:44 PM

## 2018-11-29 NOTE — Discharge Instructions (Signed)
Vaginal Yeast infection, Adult    Vaginal yeast infection is a condition that causes vaginal discharge as well as soreness, swelling, and redness (inflammation) of the vagina. This is a common condition. Some women get this infection frequently.  What are the causes?  This condition is caused by a change in the normal balance of the yeast (candida) and bacteria that live in the vagina. This change causes an overgrowth of yeast, which causes the inflammation.  What increases the risk?  The condition is more likely to develop in women who:   Take antibiotic medicines.   Have diabetes.   Take birth control pills.   Are pregnant.   Douche often.   Have a weak body defense system (immune system).   Have been taking steroid medicines for a long time.   Frequently wear tight clothing.  What are the signs or symptoms?  Symptoms of this condition include:   White, thick, creamy vaginal discharge.   Swelling, itching, redness, and irritation of the vagina. The lips of the vagina (vulva) may be affected as well.   Pain or a burning feeling while urinating.   Pain during sex.  How is this diagnosed?  This condition is diagnosed based on:   Your medical history.   A physical exam.   A pelvic exam. Your health care provider will examine a sample of your vaginal discharge under a microscope. Your health care provider may send this sample for testing to confirm the diagnosis.  How is this treated?  This condition is treated with medicine. Medicines may be over-the-counter or prescription. You may be told to use one or more of the following:   Medicine that is taken by mouth (orally).   Medicine that is applied as a cream (topically).   Medicine that is inserted directly into the vagina (suppository).  Follow these instructions at home:    Lifestyle   Do not have sex until your health care provider approves. Tell your sex partner that you have a yeast infection. That person should go to his or her health care  provider and ask if they should also be treated.   Do not wear tight clothes, such as pantyhose or tight pants.   Wear breathable cotton underwear.  General instructions   Take or apply over-the-counter and prescription medicines only as told by your health care provider.   Eat more yogurt. This may help to keep your yeast infection from returning.   Do not use tampons until your health care provider approves.   Try taking a sitz bath to help with discomfort. This is a warm water bath that is taken while you are sitting down. The water should only come up to your hips and should cover your buttocks. Do this 3-4 times per day or as told by your health care provider.   Do not douche.   If you have diabetes, keep your blood sugar levels under control.   Keep all follow-up visits as told by your health care provider. This is important.  Contact a health care provider if:   You have a fever.   Your symptoms go away and then return.   Your symptoms do not get better with treatment.   Your symptoms get worse.   You have new symptoms.   You develop blisters in or around your vagina.   You have blood coming from your vagina and it is not your menstrual period.   You develop pain in your abdomen.  Summary     Vaginal yeast infection is a condition that causes discharge as well as soreness, swelling, and redness (inflammation) of the vagina.   This condition is treated with medicine. Medicines may be over-the-counter or prescription.   Take or apply over-the-counter and prescription medicines only as told by your health care provider.   Do not douche. Do not have sex or use tampons until your health care provider approves.   Contact a health care provider if your symptoms do not get better with treatment or your symptoms go away and then return.  This information is not intended to replace advice given to you by your health care provider. Make sure you discuss any questions you have with your health care  provider.  Document Released: 03/11/2005 Document Revised: 10/18/2017 Document Reviewed: 10/18/2017  Elsevier Interactive Patient Education  2019 [employer]vier Inc[employer]

## 2018-11-30 ENCOUNTER — Inpatient Hospital Stay (HOSPITAL_COMMUNITY)
Admission: AD | Admit: 2018-11-30 | Discharge: 2018-12-01 | Disposition: A | Discharge status: Home / Self Care | Payer: Medicaid Other | Attending: Obstetrics and Gynecology | Admitting: Obstetrics and Gynecology

## 2018-11-30 ENCOUNTER — Other Ambulatory Visit: Payer: Self-pay

## 2018-11-30 ENCOUNTER — Encounter (HOSPITAL_COMMUNITY): Payer: Self-pay

## 2018-11-30 DIAGNOSIS — K219 Gastro-esophageal reflux disease without esophagitis: Secondary | ICD-10-CM | POA: Insufficient documentation

## 2018-11-30 DIAGNOSIS — O99613 Diseases of the digestive system complicating pregnancy, third trimester: Secondary | ICD-10-CM | POA: Insufficient documentation

## 2018-11-30 DIAGNOSIS — O99013 Anemia complicating pregnancy, third trimester: Secondary | ICD-10-CM | POA: Insufficient documentation

## 2018-11-30 DIAGNOSIS — Z3A33 33 weeks gestation of pregnancy: Secondary | ICD-10-CM

## 2018-11-30 DIAGNOSIS — Z79899 Other long term (current) drug therapy: Secondary | ICD-10-CM | POA: Insufficient documentation

## 2018-11-30 DIAGNOSIS — O368130 Decreased fetal movements, third trimester, not applicable or unspecified: Secondary | ICD-10-CM

## 2018-11-30 DIAGNOSIS — D649 Anemia, unspecified: Secondary | ICD-10-CM | POA: Insufficient documentation

## 2018-11-30 LAB — GC/CHLAMYDIA PROBE AMP (~~LOC~~) NOT AT ARMC
Chlamydia: NEGATIVE
Neisseria Gonorrhea: NEGATIVE

## 2018-11-30 NOTE — MAU Provider Note (Signed)
History     CSN: [id number]  Arrival date and time: 11/30/18 2236   None     Chief Complaint  Patient presents with  . Decreased Fetal Movement   HPI Elizabeth Shaffer is a G1 @ 33.4wks by 6wk scan who presents for eval of decreased fetal movement. She reports that the baby has moved at different times of the day, but just not in the same pattern as prior (ie she couldn't trigger immediate mvmt by pushing on her belly). She was seen in MAU 6/16 for the same concern. Denies leaking or bldg; no H/A or dysuria. Reports irreg BH ctx. Her preg has been followed by the [place] office since 14wks and has been remarkable for 1) anemia.  OB History    Gravida  1   Para  0   Term  0   Preterm  0   AB  0   Living  0     SAB  0   TAB  0   Ectopic  0   Multiple  0   Live Births  0           Past Medical History:  Diagnosis Date  . GERD (gastroesophageal reflux disease)   . Headache     Past Surgical History:  Procedure Laterality Date  . MOUTH SURGERY    . MOUTH SURGERY  2011  . MULTIPLE TOOTH EXTRACTIONS    . NO PAST SURGERIES    . WISDOM TOOTH EXTRACTION      Family History  Problem Relation Age of Onset  . Hypertension Mother     Social History   Tobacco Use  . Smoking status: Never Smoker  . Smokeless tobacco: Never Used  Substance Use Topics  . Alcohol use: Not Currently    Comment: on occ  . Drug use: Not Currently    Types: Marijuana    Comment: none since November 2019    Allergies: No Known Allergies  Medications Prior to Admission  Medication Sig Dispense Refill Last Dose  . ferrous fumarate (HEMOCYTE - 106 MG FE) 325 (106 Fe) MG TABS tablet Take 1 tablet (106 mg of iron total) by mouth daily. 30 each 3 Past Week at Unknown time  . ondansetron (ZOFRAN-ODT) 8 MG disintegrating tablet DISSOLVE 1 TABLET(8 MG) ON THE TONGUE EVERY 8 HOURS AS NEEDED FOR NAUSEA OR VOMITING 20 tablet 2 11/30/2018 at Unknown time  . Prenatal Multivit-Min-Fe-FA (PRENATAL  VITAMINS) 0.8 MG tablet Take 1 tablet by mouth daily. 30 tablet 12 11/30/2018 at Unknown time  . terconazole (TERAZOL 7) 0.4 % vaginal cream Place 1 applicator vaginally at bedtime for 7 days. 45 g 0 11/30/2018 at Unknown time  . famotidine (PEPCID) 20 MG tablet Take 1 tablet (20 mg total) by mouth 2 (two) times daily. 60 tablet 0 Unknown at Unknown time    Review of Systems No other pertinents other than what is listed in HPI  Physical Exam   Blood pressure 109/67, pulse 90, temperature 98.3 F (36.8 C), temperature source Oral, resp. rate 18, height 5\' 1"  (1.549 m), weight 62.6 kg, last menstrual period 03/16/2018, SpO2 100 %.  Physical Exam  Constitutional: She is oriented to person, place, and time. She appears well-developed.  Eyes: Pupils are equal, round, and reactive to light.  Neck: Normal range of motion.  GI:  EFM 130s, +accels, no decels, Cat 1 Irreg mild ctx  Musculoskeletal: Normal range of motion.  Neurological: She is alert and oriented to person,  place, and time.  Skin: Skin is warm and dry.  Psychiatric: She has a normal mood and affect. Her behavior is normal. Thought content normal.    MAU Course  Procedures  MDM NST  Assessment and Plan  IUP@33 .4wks Fetal movement pattern change Braxton Hicks ctx  -Rev'd acceptable FM patterns and how to count -Spoke w/ pt's mother on the phone to give my assessment/instructions; all questions answered; family is reassured -Keep televisit tomorrow; would like an in-office visit for next time  Arabella MerlesKimberly D Taylyn Brame CNM 11/30/2018, 11:38 PM

## 2018-11-30 NOTE — Discharge Instructions (Signed)
Fetal Movement Counts Patient Name: ________________________________________________ Patient Due Date: ____________________ What is a fetal movement count?  A fetal movement count is the number of times that you feel your baby move during a certain amount of time. This may also be called a fetal kick count. A fetal movement count is recommended for every pregnant woman. You may be asked to start counting fetal movements as early as week 28 of your pregnancy. Pay attention to when your baby is most active. You may notice your baby's sleep and wake cycles. You may also notice things that make your baby move more. You should do a fetal movement count:  When your baby is normally most active.  At the same time each day. A good time to count movements is while you are resting, after having something to eat and drink. How do I count fetal movements? 1. Find a quiet, comfortable area. Sit, or lie down on your side. 2. Write down the date, the start time and stop time, and the number of movements that you felt between those two times. Take this information with you to your health care visits. 3. For 2 hours, count kicks, flutters, swishes, rolls, and jabs. You should feel at least 10 movements during 2 hours. 4. You may stop counting after you have felt 10 movements. 5. If you do not feel 10 movements in 2 hours, have something to eat and drink. Then, keep resting and counting for 1 hour. If you feel at least 4 movements during that hour, you may stop counting. Contact a health care provider if:  You feel fewer than 4 movements in 2 hours.  Your baby is not moving like he or she usually does. Date: ____________ Start time: ____________ Stop time: ____________ Movements: ____________ Date: ____________ Start time: ____________ Stop time: ____________ Movements: ____________ Date: ____________ Start time: ____________ Stop time: ____________ Movements: ____________ Date: ____________ Start time:  ____________ Stop time: ____________ Movements: ____________ Date: ____________ Start time: ____________ Stop time: ____________ Movements: ____________ Date: ____________ Start time: ____________ Stop time: ____________ Movements: ____________ Date: ____________ Start time: ____________ Stop time: ____________ Movements: ____________ Date: ____________ Start time: ____________ Stop time: ____________ Movements: ____________ Date: ____________ Start time: ____________ Stop time: ____________ Movements: ____________ This information is not intended to replace advice given to you by your health care provider. Make sure you discuss any questions you have with your health care provider. Document Released: 07/01/2006 Document Revised: 01/29/2016 Document Reviewed: 07/11/2015 Elsevier Interactive Patient Education  2019 [employer] Braxton Hicks Contractions Contractions of the uterus can occur throughout pregnancy, but they are not always a sign that you are in labor. You may have practice contractions called Braxton Hicks contractions. These false labor contractions are sometimes confused with true labor. What are Braxton Hicks contractions? Braxton Hicks contractions are tightening movements that occur in the muscles of the uterus before labor. Unlike true labor contractions, these contractions do not result in opening (dilation) and thinning of the cervix. Toward the end of pregnancy (32-34 weeks), Braxton Hicks contractions can happen more often and may become stronger. These contractions are sometimes difficult to tell apart from true labor because they can be very uncomfortable. You should not feel embarrassed if you go to the hospital with false labor. Sometimes, the only way to tell if you are in true labor is for your health care provider to look for changes in the cervix. The health care provider will do a physical exam and may monitor your contractions. If   you are not in true labor, the exam  should show that your cervix is not dilating and your water has not broken. If there are no other health problems associated with your pregnancy, it is completely safe for you to be sent home with false labor. You may continue to have Braxton Hicks contractions until you go into true labor. How to tell the difference between true labor and false labor True labor  Contractions last 30-70 seconds.  Contractions become very regular.  Discomfort is usually felt in the top of the uterus, and it spreads to the lower abdomen and low back.  Contractions do not go away with walking.  Contractions usually become more intense and increase in frequency.  The cervix dilates and gets thinner. False labor  Contractions are usually shorter and not as strong as true labor contractions.  Contractions are usually irregular.  Contractions are often felt in the front of the lower abdomen and in the groin.  Contractions may go away when you walk around or change positions while lying down.  Contractions get weaker and are shorter-lasting as time goes on.  The cervix usually does not dilate or become thin. Follow these instructions at home:   Take over-the-counter and prescription medicines only as told by your health care provider.  Keep up with your usual exercises and follow other instructions from your health care provider.  Eat and drink lightly if you think you are going into labor.  If Braxton Hicks contractions are making you uncomfortable: ? Change your position from lying down or resting to walking, or change from walking to resting. ? Sit and rest in a tub of warm water. ? Drink enough fluid to keep your urine pale yellow. Dehydration may cause these contractions. ? Do slow and deep breathing several times an hour.  Keep all follow-up prenatal visits as told by your health care provider. This is important. Contact a health care provider if:  You have a fever.  You have continuous  pain in your abdomen. Get help right away if:  Your contractions become stronger, more regular, and closer together.  You have fluid leaking or gushing from your vagina.  You pass blood-tinged mucus (bloody show).  You have bleeding from your vagina.  You have low back pain that you never had before.  You feel your baby's head pushing down and causing pelvic pressure.  Your baby is not moving inside you as much as it used to. Summary  Contractions that occur before labor are called Braxton Hicks contractions, false labor, or practice contractions.  Braxton Hicks contractions are usually shorter, weaker, farther apart, and less regular than true labor contractions. True labor contractions usually become progressively stronger and regular, and they become more frequent.  Manage discomfort from Braxton Hicks contractions by changing position, resting in a warm bath, drinking plenty of water, or practicing deep breathing. This information is not intended to replace advice given to you by your health care provider. Make sure you discuss any questions you have with your health care provider. Document Released: 10/15/2016 Document Revised: 03/16/2017 Document Reviewed: 10/15/2016 Elsevier Interactive Patient Education  2019 Elsevier Inc.  

## 2018-11-30 NOTE — MAU Note (Signed)
Pt here with c/o decreased fetal movement for the last couple of days. Denies any bleeding or leaking. FHTs 127 with doppler. Having some headaches the last few days.

## 2018-12-01 ENCOUNTER — Telehealth (INDEPENDENT_AMBULATORY_CARE_PROVIDER_SITE_OTHER): Payer: Medicaid Other | Admitting: Obstetrics and Gynecology

## 2018-12-01 DIAGNOSIS — Z3A33 33 weeks gestation of pregnancy: Secondary | ICD-10-CM

## 2018-12-01 DIAGNOSIS — K219 Gastro-esophageal reflux disease without esophagitis: Secondary | ICD-10-CM | POA: Diagnosis not present

## 2018-12-01 DIAGNOSIS — O99013 Anemia complicating pregnancy, third trimester: Secondary | ICD-10-CM | POA: Diagnosis not present

## 2018-12-01 DIAGNOSIS — Z3403 Encounter for supervision of normal first pregnancy, third trimester: Secondary | ICD-10-CM

## 2018-12-01 DIAGNOSIS — O368130 Decreased fetal movements, third trimester, not applicable or unspecified: Secondary | ICD-10-CM | POA: Diagnosis not present

## 2018-12-01 DIAGNOSIS — O99613 Diseases of the digestive system complicating pregnancy, third trimester: Secondary | ICD-10-CM | POA: Diagnosis not present

## 2018-12-01 DIAGNOSIS — Z79899 Other long term (current) drug therapy: Secondary | ICD-10-CM | POA: Diagnosis not present

## 2018-12-01 DIAGNOSIS — D649 Anemia, unspecified: Secondary | ICD-10-CM | POA: Diagnosis not present

## 2018-12-01 DIAGNOSIS — Z3400 Encounter for supervision of normal first pregnancy, unspecified trimester: Secondary | ICD-10-CM

## 2018-12-01 NOTE — Progress Notes (Signed)
I connected with  Elizabeth Shaffer on 12/01/18 at  9:15 AM EDT by telephone and verified that I am speaking with the correct person using two identifiers.   I discussed the limitations, risks, security and privacy concerns of performing an evaluation and management service by telephone and the availability of in person appointments. I also discussed with the patient that there may be a patient responsible charge related to this service. The patient expressed understanding and agreed to proceed.  Ramirez-Perez, [place] 12/01/2018  9:19 AM

## 2018-12-01 NOTE — Progress Notes (Signed)
   TELEHEALTH VIRTUAL OBSTETRICS VISIT ENCOUNTER NOTE  I connected with Elizabeth Shaffer on 12/01/18 at  9:15 AM EDT by telephone at home and verified that I am speaking with the correct person using two identifiers.   I discussed the limitations, risks, security and privacy concerns of performing an evaluation and management service by telephone and the availability of in person appointments. I also discussed with the patient that there may be a patient responsible charge related to this service. The patient expressed understanding and agreed to proceed.  Subjective:  Elizabeth Shaffer is a 23 y.o. G1P0000 at [redacted]w[redacted]d being followed for ongoing prenatal care.  She is currently monitored for the following issues for this low-risk pregnancy and has Supervision of low-risk first pregnancy; GERD (gastroesophageal reflux disease); and Anemia affecting pregnancy in third trimester on their problem list.  Patient reports no complaints. Reports fetal movement. Denies any contractions, bleeding or leaking of fluid.   The following portions of the patient's history were reviewed and updated as appropriate: allergies, current medications, past family history, past medical history, past social history, past surgical history and problem list.   Objective:   General:  Alert, oriented and cooperative.   Mental Status: Normal mood and affect perceived. Normal judgment and thought content.  Rest of physical exam deferred due to type of encounter  Assessment and Plan:  Pregnancy: G1P0000 at [redacted]w[redacted]d  1. Encounter for supervision of low-risk first pregnancy, antepartum  BP 109/67 yesterday Occasional HA, instructed to take tylenol 1 gram PO at home. If no better, call the office.   Preterm labor symptoms and general obstetric precautions including but not limited to vaginal bleeding, contractions, leaking of fluid and fetal movement were reviewed in detail with the patient.  I discussed the assessment and treatment plan  with the patient. The patient was provided an opportunity to ask questions and all were answered. The patient agreed with the plan and demonstrated an understanding of the instructions. The patient was advised to call back or seek an in-person office evaluation/go to MAU at [facility] for any urgent or concerning symptoms. Please refer to After Visit Summary for other counseling recommendations.   I provided 12 minutes of non-face-to-face time during this encounter.  Return in about 3 weeks (around 12/22/2018), or In office visit, not virtual.  No future appointments.  Noni Saupe, NP [facility], [place]

## 2018-12-03 ENCOUNTER — Encounter (HOSPITAL_COMMUNITY): Payer: Self-pay | Admitting: *Deleted

## 2018-12-03 ENCOUNTER — Inpatient Hospital Stay (HOSPITAL_COMMUNITY)
Admission: AD | Admit: 2018-12-03 | Discharge: 2018-12-03 | Disposition: A | Discharge status: Home / Self Care | Payer: Medicaid Other | Attending: Obstetrics and Gynecology | Admitting: Obstetrics and Gynecology

## 2018-12-03 ENCOUNTER — Other Ambulatory Visit: Payer: Self-pay

## 2018-12-03 DIAGNOSIS — O479 False labor, unspecified: Secondary | ICD-10-CM

## 2018-12-03 DIAGNOSIS — O99613 Diseases of the digestive system complicating pregnancy, third trimester: Secondary | ICD-10-CM | POA: Diagnosis not present

## 2018-12-03 DIAGNOSIS — Z3A34 34 weeks gestation of pregnancy: Secondary | ICD-10-CM | POA: Diagnosis not present

## 2018-12-03 DIAGNOSIS — O26893 Other specified pregnancy related conditions, third trimester: Secondary | ICD-10-CM | POA: Insufficient documentation

## 2018-12-03 DIAGNOSIS — K219 Gastro-esophageal reflux disease without esophagitis: Secondary | ICD-10-CM | POA: Diagnosis not present

## 2018-12-03 DIAGNOSIS — Z79899 Other long term (current) drug therapy: Secondary | ICD-10-CM | POA: Diagnosis not present

## 2018-12-03 DIAGNOSIS — Z3400 Encounter for supervision of normal first pregnancy, unspecified trimester: Secondary | ICD-10-CM

## 2018-12-03 DIAGNOSIS — R109 Unspecified abdominal pain: Secondary | ICD-10-CM | POA: Diagnosis not present

## 2018-12-03 DIAGNOSIS — O4700 False labor before 37 completed weeks of gestation, unspecified trimester: Secondary | ICD-10-CM | POA: Diagnosis present

## 2018-12-03 DIAGNOSIS — O4703 False labor before 37 completed weeks of gestation, third trimester: Secondary | ICD-10-CM | POA: Diagnosis not present

## 2018-12-03 LAB — URINALYSIS, ROUTINE W REFLEX MICROSCOPIC
Bilirubin Urine: NEGATIVE
Glucose, UA: NEGATIVE mg/dL
Hgb urine dipstick: NEGATIVE
Ketones, ur: NEGATIVE mg/dL
Leukocytes,Ua: NEGATIVE
Nitrite: NEGATIVE
Protein, ur: NEGATIVE mg/dL
Specific Gravity, Urine: 1.023 (ref 1.005–1.030)
pH: 6 (ref 5.0–8.0)

## 2018-12-03 MED ORDER — TERBUTALINE SULFATE 1 MG/ML IJ SOLN
0.2500 mg | Freq: Once | INTRAMUSCULAR | Status: AC
  Administered 2018-12-03: 0.25 mg via SUBCUTANEOUS
  Filled 2018-12-03: qty 1

## 2018-12-03 NOTE — MAU Note (Signed)
Elizabeth Shaffer is a 23 y.o. at [redacted]w[redacted]d here in MAU reporting: intermittent abdominal pain that started today. Has not seen any bleeding, unsure about LOF. + FM  Onset of complaint: today  Pain score: 10/10  Vitals:   12/03/18 1349  BP: 116/70  Pulse: (!) 120  Resp: 16  Temp: 98.2 F (36.8 C)  SpO2: 100%     FHT:+ FM  Lab orders placed from triage: [facility]

## 2018-12-03 NOTE — MAU Provider Note (Signed)
History     CSN: [id number]  Arrival date and time: 12/03/18 1336   First Provider Initiated Contact with Patient 12/03/18 1414      Chief Complaint  Patient presents with  . Abdominal Pain   Ms.  Elizabeth Shaffer is a 23 y.o. year old G40P0000 female at [redacted]w[redacted]d weeks gestation who presents to MAU reporting while eating at [employer] she had a sudden onset of lower abdominal cramping that is "much worse" than the [facility] ctxs she had been having all week. She denies any VB or LOF. She reports good (+) FM today. She was seen in MAU 4 days ago where she was evaluated for preterm UC's. She reports being "given a medication to make UC's stop, it would help, bu the UC's would come right back." She was taken out of work from 11/27/18 until 11/30/18. She went to work yesterday and it "was very busy." She is a Scientist, water quality at [employer].   Abdominal Pain This is a recurrent problem. The current episode started today. The onset quality is sudden. The problem occurs intermittently. The most recent episode lasted 1 hour. The problem has been unchanged. The pain is located in the LLQ and RLQ. The pain is at a severity of 10/10. The pain is moderate. The quality of the pain is cramping. The abdominal pain does not radiate. The pain is aggravated by eating. The pain is relieved by nothing. She has tried nothing for the symptoms.     Past Medical History:  Diagnosis Date  . GERD (gastroesophageal reflux disease)   . Headache     Past Surgical History:  Procedure Laterality Date  . MOUTH SURGERY    . MOUTH SURGERY  2011  . MULTIPLE TOOTH EXTRACTIONS    . NO PAST SURGERIES    . WISDOM TOOTH EXTRACTION      Family History  Problem Relation Age of Onset  . Hypertension Mother     Social History   Tobacco Use  . Smoking status: Never Smoker  . Smokeless tobacco: Never Used  Substance Use Topics  . Alcohol use: Not Currently    Comment: on occ  . Drug use: Not Currently    Types: Marijuana    Comment: none  since November 2019    Allergies: No Known Allergies  Medications Prior to Admission  Medication Sig Dispense Refill Last Dose  . famotidine (PEPCID) 20 MG tablet Take 1 tablet (20 mg total) by mouth 2 (two) times daily. 60 tablet 0   . ferrous fumarate (HEMOCYTE - 106 MG FE) 325 (106 Fe) MG TABS tablet Take 1 tablet (106 mg of iron total) by mouth daily. 30 each 3   . ondansetron (ZOFRAN-ODT) 8 MG disintegrating tablet DISSOLVE 1 TABLET(8 MG) ON THE TONGUE EVERY 8 HOURS AS NEEDED FOR NAUSEA OR VOMITING 20 tablet 2   . Prenatal Multivit-Min-Fe-FA (PRENATAL VITAMINS) 0.8 MG tablet Take 1 tablet by mouth daily. 30 tablet 12   . terconazole (TERAZOL 7) 0.4 % vaginal cream Place 1 applicator vaginally at bedtime for 7 days. 45 g 0     Review of Systems  Constitutional: Negative.   HENT: Negative.   Eyes: Negative.   Respiratory: Negative.   Cardiovascular: Negative.   Gastrointestinal: Positive for abdominal pain.  Endocrine: Negative.   Genitourinary: Positive for pelvic pain (lower abd cramping).  Musculoskeletal: Negative.   Skin: Negative.   Allergic/Immunologic: Negative.   Neurological: Negative.   Hematological: Negative.   Psychiatric/Behavioral: Negative.    Physical  Exam   Blood pressure 116/70, pulse (!) 120, temperature 98.2 F (36.8 C), temperature source Oral, resp. rate 16, height 5\' 1"  (1.549 m), weight 62.2 kg, last menstrual period 03/16/2018, SpO2 100 %.  Physical Exam  Nursing note and vitals reviewed. Constitutional: She is oriented to person, place, and time. She appears well-developed and well-nourished.  HENT:  Head: Normocephalic and atraumatic.  Eyes: Pupils are equal, round, and reactive to light.  Neck: Normal range of motion.  Cardiovascular: Normal rate.  Respiratory: Effort normal.  GI: Soft.  Genitourinary:    Genitourinary Comments: Dilation: Closed Exam by: Carloyn Jaeger. Lenea Bywater CNM    Musculoskeletal: Normal range of motion.  Neurological: She is  alert and oriented to person, place, and time.  Skin: Skin is warm.  Psychiatric: She has a normal mood and affect. Her behavior is normal. Judgment and thought content normal.   NST - FHR: 135 bpm / moderate variability / accels present / decels absent / TOCO: irregular UC's in the beginning of tracing  monitored for close to 1.5 hrs with no UC's detected on monitor prior to d/c home  MAU Course  Procedures  MDM CCUA Terbutaline 0.25 mg SQ x 1 -- UC's stopped, only UI noted on monitor  Results for orders placed or performed during the hospital encounter of 12/03/18 (from the past 24 hour(s))  Urinalysis, Routine w reflex microscopic     Status: Abnormal   Collection Time: 12/03/18  2:06 PM  Result Value Ref Range   Color, Urine YELLOW YELLOW   APPearance HAZY (A) CLEAR   Specific Gravity, Urine 1.023 1.005 - 1.030   pH 6.0 5.0 - 8.0   Glucose, UA NEGATIVE NEGATIVE mg/dL   Hgb urine dipstick NEGATIVE NEGATIVE   Bilirubin Urine NEGATIVE NEGATIVE   Ketones, ur NEGATIVE NEGATIVE mg/dL   Protein, ur NEGATIVE NEGATIVE mg/dL   Nitrite NEGATIVE NEGATIVE   Leukocytes,Ua NEGATIVE NEGATIVE     Assessment and Plan  Preterm uterine contractions  - Information provided on PTL - Advised to increase daily water intake and rest - OOW until 12/06/18, call office if need to be out longer  - Keep scheduled appt with [facility] - Patient verbalized an understanding of the plan of care and agrees.     Elizabeth Moraolitta Jamiah Homeyer, MSN, CNM 12/03/2018, 2:23 PM

## 2018-12-13 ENCOUNTER — Telehealth: Payer: Self-pay | Admitting: *Deleted

## 2018-12-13 NOTE — Telephone Encounter (Signed)
Called pt regarding babyscripts.  Per review, pt has not logged BP into the app since 5/6.  Pt did not pick up.  Unable to leave message d/t pt not having a voicemail set up. Mychart message sent.

## 2018-12-20 ENCOUNTER — Encounter (HOSPITAL_COMMUNITY): Payer: Self-pay

## 2018-12-20 ENCOUNTER — Other Ambulatory Visit: Payer: Self-pay

## 2018-12-20 ENCOUNTER — Inpatient Hospital Stay (HOSPITAL_COMMUNITY)
Admission: AD | Admit: 2018-12-20 | Discharge: 2018-12-20 | Disposition: A | Discharge status: Home / Self Care | Payer: Medicaid Other | Attending: Family Medicine | Admitting: Family Medicine

## 2018-12-20 DIAGNOSIS — Z3689 Encounter for other specified antenatal screening: Secondary | ICD-10-CM

## 2018-12-20 DIAGNOSIS — O26893 Other specified pregnancy related conditions, third trimester: Secondary | ICD-10-CM | POA: Diagnosis not present

## 2018-12-20 DIAGNOSIS — M5441 Lumbago with sciatica, right side: Secondary | ICD-10-CM | POA: Insufficient documentation

## 2018-12-20 DIAGNOSIS — Z3A36 36 weeks gestation of pregnancy: Secondary | ICD-10-CM | POA: Diagnosis not present

## 2018-12-20 DIAGNOSIS — O1203 Gestational edema, third trimester: Secondary | ICD-10-CM

## 2018-12-20 LAB — URINALYSIS, ROUTINE W REFLEX MICROSCOPIC
Bilirubin Urine: NEGATIVE
Glucose, UA: NEGATIVE mg/dL
Hgb urine dipstick: NEGATIVE
Ketones, ur: NEGATIVE mg/dL
Nitrite: NEGATIVE
Protein, ur: NEGATIVE mg/dL
Specific Gravity, Urine: 1.02 (ref 1.005–1.030)
pH: 6 (ref 5.0–8.0)

## 2018-12-20 NOTE — Discharge Instructions (Signed)
Group B Streptococcus Test During Pregnancy Why am I having this test? Routine testing, also called screening, for group B streptococcus (GBS) is recommended for all pregnant women between the 36th and 37th week of pregnancy. GBS is a type of bacteria that can be passed from mother to baby during childbirth. Screening will help guide whether or not you will need treatment during labor and delivery to prevent complications such as:  An infection in your uterus during labor.  An infection in your uterus after delivery.  A serious infection in your baby after delivery, such as pneumonia, meningitis, or sepsis. GBS screening is not often done before 36 weeks of pregnancy unless you go into labor prematurely. What happens if I have group B streptococcus? If testing shows that you have GBS, your health care provider will recommend treatment with IV antibiotics during labor and delivery. This treatment significantly decreases the risk of complications for you and your baby. If you have a planned C-section and you have GBS, you may not need to be treated with antibiotics because GBS is usually passed to babies after labor starts and your water breaks. If you are in labor or your water breaks before your C-section, it is possible for GBS to get into your uterus and be passed to your baby, so you might need treatment. Is there a chance I may not need to be tested? You may not need to be tested for GBS if:  You have a urine test that shows GBS before 36 to 37 weeks.  You had a baby with GBS infection after a previous delivery. In these cases, you will automatically be treated for GBS during labor and delivery. What is being tested? This test is done to check if you have group B streptococcus in your vagina or rectum. What kind of sample is taken? To collect samples for this test, your health care provider will swab your vagina and rectum with a cotton swab. The sample is then sent to the lab to see if  GBS is present. What happens during the test?  Edema  Edema is an abnormal buildup of fluids in the body tissues and under the skin. Swelling of the legs, feet, and ankles is a common symptom that becomes more likely as you get older. Swelling is also common in looser tissues, like around the eyes. When the affected area is squeezed, the fluid may move out of that spot and leave a dent for a few moments. This dent is called pitting edema. There are many possible causes of edema. Eating too much salt (sodium) and being on your feet or sitting for a long time can cause edema in your legs, feet, and ankles. Hot weather may make edema worse. Common causes of edema include: Heart failure. Liver or kidney disease. Weak leg blood vessels. Cancer. An injury. Pregnancy. Medicines. Being obese. Low protein levels in the blood. Edema is usually painless. Your skin may look swollen or shiny. Follow these instructions at home: Keep the affected body part raised (elevated) above the level of your heart when you are sitting or lying down. Do not sit still or stand for long periods of time. Do not wear tight clothing. Do not wear garters on your upper legs. Exercise your legs to get your circulation going. This helps to move the fluid back into your blood vessels, and it may help the swelling go down. Wear elastic bandages or support stockings to reduce swelling as told by your health care  provider. Eat a low-salt (low-sodium) diet to reduce fluid as told by your health care provider. Depending on the cause of your swelling, you may need to limit how much fluid you drink (fluid restriction). Take over-the-counter and prescription medicines only as told by your health care provider. Contact a health care provider if: Your edema does not get better with treatment. You have heart, liver, or kidney disease and have symptoms of edema. You have sudden and unexplained weight gain. Get help right away if: You  develop shortness of breath or chest pain. You cannot breathe when you lie down. You develop pain, redness, or warmth in the swollen areas. You have heart, liver, or kidney disease and suddenly get edema. You have a fever and your symptoms suddenly get worse. Summary Edema is an abnormal buildup of fluids in the body tissues and under the skin. Eating too much salt (sodium) and being on your feet or sitting for a long time can cause edema in your legs, feet, and ankles. Keep the affected body part raised (elevated) above the level of your heart when you are sitting or lying down. This information is not intended to replace advice given to you by your health care provider. Make sure you discuss any questions you have with your health care provider. Document Released: 06/01/2005 Document Revised: 06/04/2017 Document Reviewed: 07/04/2016 Elsevier Patient Education  2020 [place].  Sciatica  Sciatica is pain, numbness, weakness, or tingling along the path of the sciatic nerve. The sciatic nerve starts in the lower back and runs down the back of each leg. The nerve controls the muscles in the lower leg and in the back of the knee. It also provides feeling (sensation) to the back of the thigh, the lower leg, and the sole of the foot. Sciatica is a symptom of another medical condition that pinches or puts pressure on the sciatic nerve. Sciatica most often only affects one side of the body. Sciatica usually goes away on its own or with treatment. In some cases, sciatica may come back (recur). What are the causes? This condition is caused by pressure on the sciatic nerve or pinching of the nerve. This may be the result of:  A disk in between the bones of the spine bulging out too far (herniated disk).  Age-related changes in the spinal disks.  A pain disorder that affects a muscle in the buttock.  Extra bone growth near the sciatic nerve.  A break (fracture) of the  pelvis.  Pregnancy.  Tumor. This is rare. What increases the risk? The following factors may make you more likely to develop this condition:  Playing sports that place pressure or stress on the spine.  Having poor strength and flexibility.  A history of back injury or surgery.  Sitting for long periods of time.  Doing activities that involve repetitive bending or lifting.  Obesity. What are the signs or symptoms? Symptoms can vary from mild to very severe, and they may include:  Any of these problems in the lower back, leg, hip, or buttock: ? Mild tingling, numbness, or dull aches. ? Burning sensations. ? Sharp pains.  Numbness in the back of the calf or the sole of the foot.  Leg weakness.  Severe back pain that makes movement difficult. Symptoms may get worse when you cough, sneeze, or laugh, or when you sit or stand for long periods of time. How is this diagnosed? This condition may be diagnosed based on:  Your symptoms and medical  history.  A physical exam.  Blood tests.  Imaging tests, such as: ? X-rays. ? MRI. ? CT scan. How is this treated? In many cases, this condition improves on its own without treatment. However, treatment may include:  Reducing or modifying physical activity.  Exercising and stretching.  Icing and applying heat to the affected area.  Medicines that help to: ? Relieve pain and swelling. ? Relax your muscles.  Injections of medicines that help to relieve pain, irritation, and inflammation around the sciatic nerve (steroids).  Surgery. Follow these instructions at home: Medicines  Take over-the-counter and prescription medicines only as told by your health care provider.  Ask your health care provider if the medicine prescribed to you: ? Requires you to avoid driving or using heavy machinery. ? Can cause constipation. You may need to take these actions to prevent or treat constipation:  Drink enough fluid to keep your  urine pale yellow.  Take over-the-counter or prescription medicines.  Eat foods that are high in fiber, such as beans, whole grains, and fresh fruits and vegetables.  Limit foods that are high in fat and processed sugars, such as fried or sweet foods. Managing pain      If directed, put ice on the affected area. ? Put ice in a plastic bag. ? Place a towel between your skin and the bag. ? Leave the ice on for 20 minutes, 2-3 times a day.  If directed, apply heat to the affected area. Use the heat source that your health care provider recommends, such as a moist heat pack or a heating pad. ? Place a towel between your skin and the heat source. ? Leave the heat on for 20-30 minutes. ? Remove the heat if your skin turns bright red. This is especially important if you are unable to feel pain, heat, or cold. You may have a greater risk of getting burned. Activity   Return to your normal activities as told by your health care provider. Ask your health care provider what activities are safe for you.  Avoid activities that make your symptoms worse.  Take brief periods of rest throughout the day. ? When you rest for longer periods, mix in some mild activity or stretching between periods of rest. This will help to prevent stiffness and pain. ? Avoid sitting for long periods of time without moving. Get up and move around at least one time each hour.  Exercise and stretch regularly, as told by your health care provider.  Do not lift anything that is heavier than 10 lb (4.5 kg) while you have symptoms of sciatica. When you do not have symptoms, you should still avoid heavy lifting, especially repetitive heavy lifting.  When you lift objects, always use proper lifting technique, which includes: ? Bending your knees. ? Keeping the load close to your body. ? Avoiding twisting. General instructions  Maintain a healthy weight. Excess weight puts extra stress on your back.  Wear supportive,  comfortable shoes. Avoid wearing high heels.  Avoid sleeping on a mattress that is too soft or too hard. A mattress that is firm enough to support your back when you sleep may help to reduce your pain.  Keep all follow-up visits as told by your health care provider. This is important. Contact a health care provider if:  You have pain that: ? Wakes you up when you are sleeping. ? Gets worse when you lie down. ? Is worse than you have experienced in the past. ? Lasts  longer than 4 weeks.  You have an unexplained weight loss. Get help right away if:  You are not able to control when you urinate or have bowel movements (incontinence).  You have: ? Weakness in your lower back, pelvis, buttocks, or legs that gets worse. ? Redness or swelling of your back. ? A burning sensation when you urinate. Summary  Sciatica is pain, numbness, weakness, or tingling along the path of the sciatic nerve.  This condition is caused by pressure on the sciatic nerve or pinching of the nerve.  Sciatica can cause pain, numbness, or tingling in the lower back, legs, hips, and buttocks.  Treatment often includes rest, exercise, medicines, and applying ice or heat. This information is not intended to replace advice given to you by your health care provider. Make sure you discuss any questions you have with your health care provider. Document Released: 05/26/2001 Document Revised: 06/20/2018 Document Reviewed: 06/20/2018 Elsevier Patient Education  2020 [employer]   You will remove your clothing from the waist down.  You will lie down on an exam table in the same position as you would for a pelvic exam.  Your health care provider will swab your vagina and rectum to collect samples for a culture test.  You will be able to go home after the test and do all your usual activities. How are the results reported? The test results are reported as positive or negative. What do the results mean?  A positive  test means you are at risk for passing GBS to your baby during labor and delivery. Your health care provider will recommend that you are treated with an IV antibiotic during labor and delivery.  A negative test means you are at very low risk of passing GBS to your baby. There is still a low risk of passing GBS to your baby because sometimes test results may report that you do not have a condition when you do (false-negative result) or there is a chance that you may become infected with GBS after the test is done. You most likely will not need to be treated with an antibiotic during labor and delivery. Talk with your health care provider about what your results mean. Questions to ask your health care provider Ask your health care provider, or the department that is doing the test:  When will my results be ready?  How will I get my results?  What are my treatment options? Summary  Routine testing (screening) for group B streptococcus (GBS) is recommended for all pregnant women between the 36th and 37th week of pregnancy.  GBS is a type of bacteria that can be passed from mother to baby during childbirth.  If testing shows that you have GBS, your health care provider will recommend that you are treated with IV antibiotics during labor and delivery. This treatment almost always prevents infection in newborns. This information is not intended to replace advice given to you by your health care provider. Make sure you discuss any questions you have with your health care provider. Document Released: 06/29/2018 Document Revised: 09/22/2018 Document Reviewed: 06/29/2018 Elsevier Patient Education  2020 [employer]

## 2018-12-20 NOTE — MAU Provider Note (Signed)
History     CSN: [id number]  Arrival date and time: 12/20/18 6834   First Provider Initiated Contact with Patient 12/20/18 1935      Chief Complaint  Patient presents with  . Foot Swelling  . sciatic pain   Elizabeth Shaffer is a 23 y.o. G1P0 at [redacted]w[redacted]d who receives care at [facility].  She presents today with complaint of swelling of her feet and right side pain. She states the swelling is constant throughout the day, but is worse at night after work.  She states the pain is intermittent in nature and feels like "a real bad charlie horse."  She states the pain is resolved with rest, but returns with movement.  She states it radiates up and down her leg and sends a "shocking pain" to her lower back on the right side.  She states she has not used any medications or non-pharmacologic interventions to assist with the pain.       OB History    Gravida  1   Para  0   Term  0   Preterm  0   AB  0   Living  0     SAB  0   TAB  0   Ectopic  0   Multiple  0   Live Births  0           Past Medical History:  Diagnosis Date  . GERD (gastroesophageal reflux disease)   . Headache     Past Surgical History:  Procedure Laterality Date  . MOUTH SURGERY    . MOUTH SURGERY  2011  . MULTIPLE TOOTH EXTRACTIONS    . NO PAST SURGERIES    . WISDOM TOOTH EXTRACTION      Family History  Problem Relation Age of Onset  . Hypertension Mother     Social History   Tobacco Use  . Smoking status: Never Smoker  . Smokeless tobacco: Never Used  Substance Use Topics  . Alcohol use: Not Currently    Comment: on occ  . Drug use: Not Currently    Types: Marijuana    Comment: none since November 2019    Allergies: No Known Allergies  Medications Prior to Admission  Medication Sig Dispense Refill Last Dose  . ondansetron (ZOFRAN-ODT) 8 MG disintegrating tablet DISSOLVE 1 TABLET(8 MG) ON THE TONGUE EVERY 8 HOURS AS NEEDED FOR NAUSEA OR VOMITING 20 tablet 2 12/19/2018 at Unknown time   . Prenatal Multivit-Min-Fe-FA (PRENATAL VITAMINS) 0.8 MG tablet Take 1 tablet by mouth daily. 30 tablet 12 12/20/2018 at Unknown time  . famotidine (PEPCID) 20 MG tablet Take 1 tablet (20 mg total) by mouth 2 (two) times daily. 60 tablet 0   . ferrous fumarate (HEMOCYTE - 106 MG FE) 325 (106 Fe) MG TABS tablet Take 1 tablet (106 mg of iron total) by mouth daily. 30 each 3     Review of Systems  Constitutional: Negative for chills and fever.  Eyes: Negative for visual disturbance.  Respiratory: Negative for cough and shortness of breath.   Gastrointestinal: Negative for abdominal pain, constipation, diarrhea, nausea and vomiting.  Genitourinary: Negative for difficulty urinating, dysuria and vaginal bleeding.  Musculoskeletal: Positive for back pain and gait problem (Difficulty walking d/t swollen feet. ).  Neurological: Negative for dizziness, light-headedness and headaches.   Physical Exam   Blood pressure 111/72, pulse 84, temperature 98.3 F (36.8 C), temperature source Oral, resp. rate 18, weight 64.7 kg, last menstrual period 03/16/2018, SpO2 100 %.  Physical Exam  Constitutional: She is oriented to person, place, and time. She appears well-developed and well-nourished.  HENT:  Head: Normocephalic and atraumatic.  Eyes: Conjunctivae are normal.  Neck: Normal range of motion.  Cardiovascular: Normal rate.  Respiratory: Effort normal.  GI: Soft.  Soft, NT FH: 36cm  Musculoskeletal: Normal range of motion.        General: Edema (+2 in Feet and Lower Legs-Bilaterally) present.  Neurological: She is alert and oriented to person, place, and time.  Skin: Skin is warm and dry.  Psychiatric: She has a normal mood and affect. Her behavior is normal.    Fetal Assessment 135 bpm, Mod Var, -Decels, +Accels Toco: Occasional   MAU Course   Results for orders placed or performed during the hospital encounter of 12/20/18 (from the past 24 hour(s))  Urinalysis, Routine w reflex  microscopic     Status: Abnormal   Collection Time: 12/20/18  7:05 PM  Result Value Ref Range   Color, Urine YELLOW YELLOW   APPearance HAZY (A) CLEAR   Specific Gravity, Urine 1.020 1.005 - 1.030   pH 6.0 5.0 - 8.0   Glucose, UA NEGATIVE NEGATIVE mg/dL   Hgb urine dipstick NEGATIVE NEGATIVE   Bilirubin Urine NEGATIVE NEGATIVE   Ketones, ur NEGATIVE NEGATIVE mg/dL   Protein, ur NEGATIVE NEGATIVE mg/dL   Nitrite NEGATIVE NEGATIVE   Leukocytes,Ua SMALL (A) NEGATIVE   RBC / HPF 0-5 0 - 5 RBC/hpf   WBC, UA 0-5 0 - 5 WBC/hpf   Bacteria, UA FEW (A) NONE SEEN   Squamous Epithelial / LPF 0-5 0 - 5   Mucus PRESENT    No results found.  MDM PE Labs:UA EFM  Assessment and Plan  23 year old G1P0  SIUP at 36.3 weeks Cat I FT Edema Sciatica  -Exam findings discussed. -Informed of normalcy of findings today. -Extensive discussion regarding comfort measures for sciatica pain including stretching, position changes, tylenol, and cold/hot compresses. -Counseling given regarding relief measures for edema including elevation of extremities, hydration, proper footwear, frequent rests at work. -Informed that GBS swab and GC/CT would be collected today and office visit would be changed to virtual visit.  -No other questions or concerns. -Will give OOW excuse for the remainder of work week.   -Encouraged to call or return to MAU if symptoms worsen or with the onset of new symptoms. -Discharged to home in stable condition.  Cherre RobinsJessica L Zymier Rodgers MSN, CNM 12/20/2018, 7:36 PM

## 2018-12-20 NOTE — MAU Note (Addendum)
Noticed for the past couple days, been about 3 days now, her feet have been swelling.  Has been propping her feet up, still swelling really bad.  Can't wear any of her shoes. Having real bad sciatic pain on the rt side, can hardly bend the leg or walk.

## 2018-12-21 ENCOUNTER — Telehealth: Payer: Self-pay | Admitting: Obstetrics & Gynecology

## 2018-12-21 LAB — GC/CHLAMYDIA PROBE AMP (~~LOC~~) NOT AT ARMC
Chlamydia: NEGATIVE
Neisseria Gonorrhea: NEGATIVE

## 2018-12-21 NOTE — Telephone Encounter (Signed)
Called the patient to inform of upcoming visit. Received a message the person you are trying to reach has a voicemail box that has not been setup yet, good-bye.

## 2018-12-22 ENCOUNTER — Encounter: Payer: Medicaid Other | Admitting: Internal Medicine

## 2018-12-22 LAB — CULTURE, BETA STREP (GROUP B ONLY)

## 2018-12-24 LAB — OB RESULTS CONSOLE GBS: GBS: NEGATIVE

## 2018-12-25 ENCOUNTER — Inpatient Hospital Stay (HOSPITAL_COMMUNITY)
Admission: AD | Admit: 2018-12-25 | Discharge: 2018-12-25 | Disposition: A | Discharge status: Home / Self Care | Payer: Medicaid Other | Attending: Obstetrics and Gynecology | Admitting: Obstetrics and Gynecology

## 2018-12-25 ENCOUNTER — Encounter (HOSPITAL_COMMUNITY): Payer: Self-pay

## 2018-12-25 ENCOUNTER — Inpatient Hospital Stay (EMERGENCY_DEPARTMENT_HOSPITAL)
Admission: AD | Admit: 2018-12-25 | Discharge: 2018-12-25 | Disposition: A | Discharge status: Home / Self Care | Payer: Medicaid Other | Source: Home / Self Care | Attending: Obstetrics & Gynecology | Admitting: Obstetrics & Gynecology

## 2018-12-25 ENCOUNTER — Other Ambulatory Visit: Payer: Self-pay

## 2018-12-25 DIAGNOSIS — O26893 Other specified pregnancy related conditions, third trimester: Secondary | ICD-10-CM | POA: Insufficient documentation

## 2018-12-25 DIAGNOSIS — O471 False labor at or after 37 completed weeks of gestation: Secondary | ICD-10-CM

## 2018-12-25 DIAGNOSIS — Z0371 Encounter for suspected problem with amniotic cavity and membrane ruled out: Secondary | ICD-10-CM | POA: Diagnosis not present

## 2018-12-25 DIAGNOSIS — O479 False labor, unspecified: Secondary | ICD-10-CM

## 2018-12-25 DIAGNOSIS — R030 Elevated blood-pressure reading, without diagnosis of hypertension: Secondary | ICD-10-CM | POA: Diagnosis not present

## 2018-12-25 DIAGNOSIS — Z3A37 37 weeks gestation of pregnancy: Secondary | ICD-10-CM | POA: Insufficient documentation

## 2018-12-25 LAB — AMNISURE RUPTURE OF MEMBRANE (ROM) NOT AT ARMC: Amnisure ROM: NEGATIVE

## 2018-12-25 LAB — POCT FERN TEST: POCT Fern Test: NEGATIVE

## 2018-12-25 MED ORDER — FENTANYL CITRATE (PF) 100 MCG/2ML IJ SOLN
INTRAMUSCULAR | Status: AC
  Administered 2018-12-25: 100 ug
  Filled 2018-12-25: qty 2

## 2018-12-25 MED ORDER — FENTANYL CITRATE (PF) 100 MCG/2ML IJ SOLN
100.0000 ug | Freq: Once | INTRAMUSCULAR | Status: AC
  Filled 2018-12-25 (×2): qty 2

## 2018-12-25 MED ORDER — LACTATED RINGERS IV BOLUS
1000.0000 mL | Freq: Once | INTRAVENOUS | Status: AC
  Administered 2018-12-25: 1000 mL via INTRAVENOUS

## 2018-12-25 NOTE — MAU Provider Note (Signed)
Chief Complaint:  Contractions   None    HPI: Elizabeth Shaffer is a [age] y.o. G1P0000 at 5521w1d who presents to maternity admissions reporting contractions. Also complains of increase urination vs LOF. No vaginal bleeding. Good fetal movement. Denies dysuria.   Location: abdomen Quality: contractions Severity: 5/10 in pain scale Duration: 1 day Timing: every 5 minutes Modifying factors: none Associated signs and symptoms: none  Past Medical History:  Diagnosis Date  . GERD (gastroesophageal reflux disease)   . Headache    OB History  Gravida Para Term Preterm AB Living  1 0 0 0 0 0  SAB TAB Ectopic Multiple Live Births  0 0 0 0 0    # Outcome Date GA Lbr Len/2nd Weight Sex Delivery Anes PTL Lv  1 Current            Past Surgical History:  Procedure Laterality Date  . MOUTH SURGERY    . MOUTH SURGERY  2011  . MULTIPLE TOOTH EXTRACTIONS    . NO PAST SURGERIES    . WISDOM TOOTH EXTRACTION     Family History  Problem Relation Age of Onset  . Hypertension Mother    Social History   Tobacco Use  . Smoking status: Never Smoker  . Smokeless tobacco: Never Used  Substance Use Topics  . Alcohol use: Not Currently    Comment: on occ  . Drug use: Not Currently    Types: Marijuana    Comment: none since November 2019   No Known Allergies Medications Prior to Admission  Medication Sig Dispense Refill Last Dose  . Prenatal Vit-Fe Fumarate-FA (MULTIVITAMIN-PRENATAL) 27-0.8 MG TABS tablet Take 1 tablet by mouth daily at 12 noon.     . ferrous fumarate (HEMOCYTE - 106 MG FE) 325 (106 Fe) MG TABS tablet Take 1 tablet (106 mg of iron total) by mouth daily. 30 each 3   . ondansetron (ZOFRAN-ODT) 8 MG disintegrating tablet DISSOLVE 1 TABLET(8 MG) ON THE TONGUE EVERY 8 HOURS AS NEEDED FOR NAUSEA OR VOMITING 20 tablet 2     I have reviewed patient's Past Medical Hx, Surgical Hx, Family Hx, Social Hx, medications and allergies.   ROS:  Review of Systems  Constitutional: Negative.    Gastrointestinal: Positive for abdominal pain.  Genitourinary: Positive for vaginal discharge.    Physical Exam   Patient Vitals for the past 24 hrs:  BP Temp Temp src Pulse Resp Weight  12/25/18 0840 114/69 - - 89 - -  12/25/18 0745 - 98.3 F (36.8 C) Oral - - -  12/25/18 0731 115/67 - - 71 - -  12/25/18 0716 110/74 - - 78 - -  12/25/18 0702 128/85 - - 75 - -  12/25/18 0646 130/76 - - 97 - -  12/25/18 0631 (!) 141/92 - - 85 - -  12/25/18 0619 118/79 98 F (36.7 C) - 95 20 65.9 kg    Constitutional: Well-developed, well-nourished female in no acute distress.  Cardiovascular: normal rate & rhythm, no murmur Respiratory: normal effort, lung sounds clear throughout GI: Abd soft, non-tender, gravid appropriate for gestational age. Pos BS x 4 MS: Extremities nontender, no edema, normal ROM Neurologic: Alert and oriented x 4.  GU:     Dilation: 1 Effacement (%): Thick Cervical Position: Posterior Station: -3, -2 Presentation: Undeterminable Exam by:: Janeth Rasehristina Robinson RN   NST:  Baseline: 130 bpm, Variability: Good {> 6 bpm), Accelerations: Reactive and Decelerations: Absent   Labs: Results for orders placed or performed during  the hospital encounter of 12/25/18 (from the past 24 hour(s))  Amnisure rupture of membrane (rom)not at [facility]     Status: None   Collection Time: 12/25/18  8:00 AM  Result Value Ref Range   Amnisure ROM NEGATIVE   Fern Test     Status: None   Collection Time: 12/25/18  8:06 AM  Result Value Ref Range   POCT Fern Test Negative = intact amniotic membranes     Imaging:  No results found.  MAU Course: Orders Placed This Encounter  Procedures  . OB RESULT CONSOLE Group B Strep  . Amnisure rupture of membrane (rom)not at [facility]  . Fern Test  . Discharge patient   Meds ordered this encounter  Medications  . fentaNYL (SUBLIMAZE) injection 100 mcg  . lactated ringers bolus 1,000 mL  . fentaNYL (SUBLIMAZE) 100 MCG/2ML injection    Gilmer Mor   : cabinet override    MDM: Reactive tracing. Cervix unchanged after 2+ hours of monitoring.  Elevated BP x 1. All others normal. Pt asymptomatic & no hx of htn.  Fern & amnisure negative  Assessment: 1. False labor   2. Encounter for suspected PROM, with rupture of membranes not found   3. [redacted] weeks gestation of pregnancy     Plan: Discharge home in stable condition.  Labor precautions and fetal kick counts Follow-up Information    [facility] Follow up.   Specialty: Obstetrics and Gynecology Why: return for worsening symptoms Contact information: [address] [id number] [place] [phone]          Allergies as of 12/25/2018   No Known Allergies     Medication List    TAKE these medications   ferrous fumarate 325 (106 Fe) MG Tabs tablet Commonly known as: HEMOCYTE - 106 mg FE Take 1 tablet (106 mg of iron total) by mouth daily.   multivitamin-prenatal 27-0.8 MG Tabs tablet Take 1 tablet by mouth daily at 12 noon.   ondansetron 8 MG disintegrating tablet Commonly known as: ZOFRAN-ODT DISSOLVE 1 TABLET(8 MG) ON THE TONGUE EVERY 8 HOURS AS NEEDED FOR NAUSEA OR VOMITING       Jorje Guild, NP 12/25/2018 8:42 AM

## 2018-12-25 NOTE — MAU Note (Signed)
Contractions that feel back to back.  No VB/LOF.  + FM.

## 2018-12-25 NOTE — MAU Note (Signed)
Patient presents to MAU c/o ctx that have been going on all day. Patient reports DFM, patient denies LOF/vaginal bleeding.

## 2018-12-25 NOTE — Discharge Instructions (Signed)
Signs and Symptoms of Labor Labor is your body's natural process of moving your baby, placenta, and umbilical cord out of your uterus. The process of labor usually starts when your baby is full-term, between 37 and 40 weeks of pregnancy. How will I know when I am close to going into labor? As your body prepares for labor and the birth of your baby, you may notice the following symptoms in the weeks and days before true labor starts:  Having a strong desire to get your home ready to receive your new baby. This is called nesting. Nesting may be a sign that labor is approaching, and it may occur several weeks before birth. Nesting may involve cleaning and organizing your home.  Passing a small amount of thick, bloody mucus out of your vagina (normal bloody show or losing your mucus plug). This may happen more than a week before labor begins, or it might occur right before labor begins as the opening of the cervix starts to widen (dilate). For some women, the entire mucus plug passes at once. For others, smaller portions of the mucus plug may gradually pass over several days.  Your baby moving (dropping) lower in your pelvis to get into position for birth (lightening). When this happens, you may feel more pressure on your bladder and pelvic bone and less pressure on your ribs. This may make it easier to breathe. It may also cause you to need to urinate more often and have problems with bowel movements.  Having "practice contractions" (Braxton Hicks contractions) that occur at irregular (unevenly spaced) intervals that are more than 10 minutes apart. This is also called false labor. False labor contractions are common after exercise or sexual activity, and they will stop if you change position, rest, or drink fluids. These contractions are usually mild and do not get stronger over time. They may feel like: ? A backache or back pain. ? Mild cramps, similar to menstrual cramps. ? Tightening or pressure in  your abdomen. Other early symptoms that labor may be starting soon include:  Nausea or loss of appetite.  Diarrhea.  Having a sudden burst of energy, or feeling very tired.  Mood changes.  Having trouble sleeping. How will I know when labor has begun? Signs that true labor has begun may include:  Having contractions that come at regular (evenly spaced) intervals and increase in intensity. This may feel like more intense tightening or pressure in your abdomen that moves to your back. ? Contractions may also feel like rhythmic pain in your upper thighs or back that comes and goes at regular intervals. ? For first-time mothers, this change in intensity of contractions often occurs at a more gradual pace. ? Women who have given birth before may notice a more rapid progression of contraction changes.  Having a feeling of pressure in the vaginal area.  Your water breaking (rupture of membranes). This is when the sac of fluid that surrounds your baby breaks. When this happens, you will notice fluid leaking from your vagina. This may be clear or blood-tinged. Labor usually starts within 24 hours of your water breaking, but it may take longer to begin. ? Some women notice this as a gush of fluid. ? Others notice that their underwear repeatedly becomes damp. Follow these instructions at home:   When labor starts, or if your water breaks, call your health care provider or nurse care line. Based on your situation, they will determine when you should go in for an   exam.  When you are in early labor, you may be able to rest and manage symptoms at home. Some strategies to try at home include: ? Breathing and relaxation techniques. ? Taking a warm bath or shower. ? Listening to music. ? Using a heating pad on the lower back for pain. If you are directed to use heat:  Place a towel between your skin and the heat source.  Leave the heat on for 20-30 minutes.  Remove the heat if your skin turns  bright red. This is especially important if you are unable to feel pain, heat, or cold. You may have a greater risk of getting burned. Get help right away if:  You have painful, regular contractions that are 5 minutes apart or less.  Labor starts before you are [redacted] weeks along in your pregnancy.  You have a fever.  You have a headache that does not go away.  You have bright red blood coming from your vagina.  You do not feel your baby moving.  You have a sudden onset of: ? Severe headache with vision problems. ? Nausea, vomiting, or diarrhea. ? Chest pain or shortness of breath. These symptoms may be an emergency. If your health care provider recommends that you go to the hospital or birth center where you plan to deliver, do not drive yourself. Have someone else drive you, or call emergency services (911 in the [place] Summary  Labor is your body's natural process of moving your baby, placenta, and umbilical cord out of your uterus.  The process of labor usually starts when your baby is full-term, between 37 and 40 weeks of pregnancy.  When labor starts, or if your water breaks, call your health care provider or nurse care line. Based on your situation, they will determine when you should go in for an exam. This information is not intended to replace advice given to you by your health care provider. Make sure you discuss any questions you have with your health care provider. Document Released: 11/06/2016 Document Revised: 03/01/2017 Document Reviewed: 11/06/2016 Elsevier Patient Education  2020 Else[employer] Inc[employer]  Fetal Movement Counts Patient Name: ________________________________________________ Patient Due Date: ____________________ What is a fetal movement count?  A fetal movement count is the number of times that you feel your baby move during a certain amount of time. This may also be called a fetal kick count. A fetal movement count is recommended for every pregnant woman. You may  be asked to start counting fetal movements as early as week 28 of your pregnancy. Pay attention to when your baby is most active. You may notice your baby's sleep and wake cycles. You may also notice things that make your baby move more. You should do a fetal movement count:  When your baby is normally most active.  At the same time each day. A good time to count movements is while you are resting, after having something to eat and drink. How do I count fetal movements? 1. Find a quiet, comfortable area. Sit, or lie down on your side. 2. Write down the date, the start time and stop time, and the number of movements that you felt between those two times. Take this information with you to your health care visits. 3. For 2 hours, count kicks, flutters, swishes, rolls, and jabs. You should feel at least 10 movements during 2 hours. 4. You may stop counting after you have felt 10 movements. 5. If you do not feel 10 movements in 2   hours, have something to eat and drink. Then, keep resting and counting for 1 hour. If you feel at least 4 movements during that hour, you may stop counting. Contact a health care provider if:  You feel fewer than 4 movements in 2 hours.  Your baby is not moving like he or she usually does. Date: ____________ Start time: ____________ Stop time: ____________ Movements: ____________ Date: ____________ Start time: ____________ Stop time: ____________ Movements: ____________ Date: ____________ Start time: ____________ Stop time: ____________ Movements: ____________ Date: ____________ Start time: ____________ Stop time: ____________ Movements: ____________ Date: ____________ Start time: ____________ Stop time: ____________ Movements: ____________ Date: ____________ Start time: ____________ Stop time: ____________ Movements: ____________ Date: ____________ Start time: ____________ Stop time: ____________ Movements: ____________ Date: ____________ Start time: ____________ Stop  time: ____________ Movements: ____________ Date: ____________ Start time: ____________ Stop time: ____________ Movements: ____________ This information is not intended to replace advice given to you by your health care provider. Make sure you discuss any questions you have with your health care provider. Document Released: 07/01/2006 Document Revised: 06/21/2018 Document Reviewed: 07/11/2015 Elsevier Patient Education  2020 Elsevier Inc.  

## 2018-12-25 NOTE — Progress Notes (Signed)
Pt states," I have been urinating more today and sometimes my urine has been watery and clear; starting around 0200am today No odor and a small amount". Dr. Higinio Plan aware. Fern test done=negative. Will order Amnisure r/o ROM.   Gilmer Mor RN

## 2018-12-25 NOTE — MAU Provider Note (Signed)
S: Ms. Alandra Sando is a 23 y.o. G1P0000 at [redacted]w[redacted]d  who presents to MAU today complaining contractions q 3 minutes since yesterday. She denies vaginal bleeding. She denies LOF. She reports normal fetal movement.    O: BP 128/86   Pulse 87   Temp 98.4 F (36.9 C) (Oral)   Resp 20   Ht 5' (1.524 m)   Wt 66.7 kg   LMP 03/16/2018   SpO2 100%   BMI 28.73 kg/m  GENERAL: Well-developed, well-nourished female in no acute distress.  HEAD: Normocephalic, atraumatic.  CHEST: Normal effort of breathing, regular heart rate ABDOMEN: Soft, nontender, gravid  Cervical exam:  Dilation: 1.5 Effacement (%): 90 Cervical Position: Middle, Posterior Station: -3 Presentation: Vertex Exam by:: Sharmon Revere, CNM   Fetal Monitoring: Baseline: 130 Variability: moderate Accelerations: 15x15 Decelerations: none Contractions: 2-5   A: SIUP at [redacted]w[redacted]d  False labor  P: Discharge home -Labor precautions discussed -Patient advised to follow-up with [facility]  -Patient may return to MAU as needed or if her condition were to change or worsen   Wende Mott, [place] 12/25/2018 9:35 PM

## 2018-12-25 NOTE — Discharge Instructions (Signed)

## 2018-12-25 NOTE — MAU Note (Signed)
I have communicated with Sharmon Revere, CNM and reviewed vital signs:  Vitals:   12/25/18 2002 12/25/18 2142  BP: 128/86 117/72  Pulse: 87 76  Resp: 20 19  Temp:  98.5 F (36.9 C)  SpO2: 100% 100%    Vaginal exam:  Dilation: 1.5 Effacement (%): 90 Cervical Position: Middle, Posterior Station: -3 Presentation: Vertex Exam by:: Sharmon Revere, CNM,   Also reviewed contraction pattern and that non-stress test is reactive.  It has been documented that patient is contracting every 2-5 minutes with no cervical change over 1.5 hours not indicating active labor.  Patient denies any other complaints.  Based on this report provider has given order for discharge.  A discharge order and diagnosis entered by a provider.   Labor discharge instructions reviewed with patient.

## 2018-12-26 ENCOUNTER — Other Ambulatory Visit: Payer: Self-pay

## 2018-12-26 ENCOUNTER — Encounter (HOSPITAL_COMMUNITY): Payer: Self-pay

## 2018-12-26 ENCOUNTER — Inpatient Hospital Stay (HOSPITAL_COMMUNITY)
Admission: AD | Admit: 2018-12-26 | Discharge: 2018-12-26 | Disposition: A | Discharge status: Home / Self Care | Payer: Medicaid Other | Attending: Obstetrics & Gynecology | Admitting: Obstetrics & Gynecology

## 2018-12-26 ENCOUNTER — Inpatient Hospital Stay (EMERGENCY_DEPARTMENT_HOSPITAL)
Admission: AD | Admit: 2018-12-26 | Discharge: 2018-12-26 | Disposition: A | Discharge status: Home / Self Care | Payer: Medicaid Other | Source: Home / Self Care | Attending: Obstetrics and Gynecology | Admitting: Obstetrics and Gynecology

## 2018-12-26 ENCOUNTER — Encounter (HOSPITAL_COMMUNITY): Payer: Self-pay | Admitting: *Deleted

## 2018-12-26 DIAGNOSIS — O479 False labor, unspecified: Secondary | ICD-10-CM

## 2018-12-26 DIAGNOSIS — R12 Heartburn: Secondary | ICD-10-CM | POA: Diagnosis not present

## 2018-12-26 DIAGNOSIS — O471 False labor at or after 37 completed weeks of gestation: Secondary | ICD-10-CM | POA: Insufficient documentation

## 2018-12-26 DIAGNOSIS — O26893 Other specified pregnancy related conditions, third trimester: Secondary | ICD-10-CM

## 2018-12-26 DIAGNOSIS — Z3A37 37 weeks gestation of pregnancy: Secondary | ICD-10-CM | POA: Diagnosis not present

## 2018-12-26 DIAGNOSIS — O9963 Diseases of the digestive system complicating the puerperium: Secondary | ICD-10-CM | POA: Diagnosis not present

## 2018-12-26 DIAGNOSIS — K219 Gastro-esophageal reflux disease without esophagitis: Secondary | ICD-10-CM

## 2018-12-26 MED ORDER — ZOLPIDEM TARTRATE 5 MG PO TABS: 5.0000 mg | ORAL_TABLET | Freq: Every evening | ORAL | 0 refills | Status: DC | PRN

## 2018-12-26 MED ORDER — CALCIUM CARBONATE ANTACID 500 MG PO CHEW
2.0000 | CHEWABLE_TABLET | Freq: Once | ORAL | Status: AC
  Administered 2018-12-26: 400 mg via ORAL
  Filled 2018-12-26: qty 2

## 2018-12-26 MED ORDER — FAMOTIDINE 20 MG PO TABS: 20.0000 mg | ORAL_TABLET | Freq: Two times a day (BID) | ORAL | 0 refills | Status: DC

## 2018-12-26 MED ORDER — FAMOTIDINE 20 MG PO TABS
40.0000 mg | ORAL_TABLET | Freq: Once | ORAL | Status: AC
  Administered 2018-12-26: 40 mg via ORAL
  Filled 2018-12-26: qty 2

## 2018-12-26 MED ORDER — BUTORPHANOL TARTRATE 1 MG/ML IJ SOLN
1.0000 mg | Freq: Once | INTRAMUSCULAR | Status: AC
  Administered 2018-12-26: 1 mg via INTRAMUSCULAR
  Filled 2018-12-26: qty 1

## 2018-12-26 MED ORDER — PROMETHAZINE HCL 25 MG/ML IJ SOLN
25.0000 mg | Freq: Once | INTRAMUSCULAR | Status: AC
  Administered 2018-12-26: 10:00:00 25 mg via INTRAMUSCULAR
  Filled 2018-12-26: qty 1

## 2018-12-26 MED ORDER — ZOLPIDEM TARTRATE 5 MG PO TABS
5.0000 mg | ORAL_TABLET | Freq: Once | ORAL | Status: AC
  Administered 2018-12-26: 5 mg via ORAL
  Filled 2018-12-26: qty 1

## 2018-12-26 NOTE — MAU Note (Signed)
Presents with c/o regular ctxs since last night.  Denies LOF or VB.  Reports +FM.  Was sent home during the night.

## 2018-12-26 NOTE — MAU Note (Signed)
Pt here for contractions every 2-3 mins. Denies LOF or vaginal bleeding. Reports good fetal movement. Cervix 1.5cm earlier today

## 2018-12-26 NOTE — MAU Provider Note (Signed)
S: [name] is a 23 y.o. G1P0000 at [redacted]w[redacted]d  who presents to MAU today complaining contractions q 2-3 minutes for the last 2 days. She denies vaginal bleeding. She denies LOF. She reports normal fetal movement.    O: BP 128/80   Pulse 77   Temp 98.4 F (36.9 C) (Oral)   Resp 20   LMP 03/16/2018   SpO2 99%  GENERAL: Well-developed, well-nourished female in no acute distress.  HEAD: Normocephalic, atraumatic.  CHEST: Normal effort of breathing, regular heart rate ABDOMEN: Soft, nontender, gravid  Cervical exam:  Dilation: 1 Effacement (%): 70 Cervical Position: Posterior Station: -3 Presentation: Vertex Exam by:: Maryagnes Amos RN   Fetal Monitoring: Baseline: 120 Variability: moderate Accelerations: 15x15 Decelerations: none Contractions: 2-5   A: SIUP at [redacted]w[redacted]d  False labor  Offered patient ambien to sleep. Patient agreeable and requesting TUMS for heartburn.   P: Discharge home -Labor precautions discussed -Patient advised to follow-up with [facility] as scheduled for prenatal care -Patient may return to MAU as needed or if her condition were to change or worsen   Wende Mott, [place] 12/26/2018 3:05 AM

## 2018-12-26 NOTE — MAU Provider Note (Signed)
S: [name] is a 23 y.o. G1P0000 at [redacted]w[redacted]d  who presents to MAU today complaining contractions with irregular pattern for 3 days. She denies vaginal bleeding. She denies LOF. She reports normal fetal movement.    O: BP (!) 97/56 (BP Location: Right Arm)   Pulse 71   Temp 98.1 F (36.7 C) (Oral)   Resp 18   Ht 5' (1.524 m)   Wt 65.9 kg   LMP 03/16/2018   SpO2 100%   BMI 28.38 kg/m  GENERAL: Well-developed, well-nourished female in no acute distress.  HEAD: Normocephalic, atraumatic.  CHEST: Normal effort of breathing, regular heart rate ABDOMEN: Soft, nontender, gravid  Cervical exam:  Dilation: 1.5 Effacement (%): 70 Cervical Position: Posterior Station: -3 Presentation: Vertex Exam by:: T Lytle RN    Fetal Monitoring: Baseline: 135 bpm Variability: Moderate  Accelerations: 15x15 Decelerations: None Contractions: Irregular pattern   MDM:  Patient with 3rd visit to MAU for prodromal labor. Cervix unchanged. Stadol and phenergan given IM. Patient resting/sleeping in room.    A: SIUP at [redacted]w[redacted]d  False labor  Prodromal labor  GERD  P: Discharge home Rx: Ambien # 5, Pepcid  Return when contractions are stronger and closer together Kick counts   Rasch, Artist Pais, NP 12/26/2018 6:07 PM

## 2018-12-26 NOTE — MAU Note (Signed)
I have communicated with Len Blalock, CNM and reviewed vital signs:  Vitals:   12/26/18 0140 12/26/18 0150  BP: 122/84 128/80  Pulse: 76 77  Resp: 20   Temp: 98.4 F (36.9 C)   SpO2: 100% 99%    Vaginal exam:  Dilation: 1 Effacement (%): 70 Cervical Position: Posterior Station: -3 Presentation: Vertex Exam by:: Maryagnes Amos RN,   Also reviewed contraction pattern and that non-stress test is reactive.  It has been documented that patient is contracting every 5-6 minutes with no cervical change over 1 hour not indicating active labor.  Patient denies any other complaints.  Based on this report provider has given order for discharge.  A discharge order and diagnosis entered by a provider.   Labor discharge instructions reviewed with patient.

## 2018-12-26 NOTE — Discharge Instructions (Signed)
Braxton Hicks Contractions Contractions of the uterus can occur throughout pregnancy, but they are not always a sign that you are in labor. You may have practice contractions called Braxton Hicks contractions. These false labor contractions are sometimes confused with true labor. What are Braxton Hicks contractions? Braxton Hicks contractions are tightening movements that occur in the muscles of the uterus before labor. Unlike true labor contractions, these contractions do not result in opening (dilation) and thinning of the cervix. Toward the end of pregnancy (32-34 weeks), Braxton Hicks contractions can happen more often and may become stronger. These contractions are sometimes difficult to tell apart from true labor because they can be very uncomfortable. You should not feel embarrassed if you go to the hospital with false labor. Sometimes, the only way to tell if you are in true labor is for your health care provider to look for changes in the cervix. The health care provider will do a physical exam and may monitor your contractions. If you are not in true labor, the exam should show that your cervix is not dilating and your water has not broken. If there are no other health problems associated with your pregnancy, it is completely safe for you to be sent home with false labor. You may continue to have Braxton Hicks contractions until you go into true labor. How to tell the difference between true labor and false labor True labor  Contractions last 30-70 seconds.  Contractions become very regular.  Discomfort is usually felt in the top of the uterus, and it spreads to the lower abdomen and low back.  Contractions do not go away with walking.  Contractions usually become more intense and increase in frequency.  The cervix dilates and gets thinner. False labor  Contractions are usually shorter and not as strong as true labor contractions.  Contractions are usually irregular.  Contractions  are often felt in the front of the lower abdomen and in the groin.  Contractions may go away when you walk around or change positions while lying down.  Contractions get weaker and are shorter-lasting as time goes on.  The cervix usually does not dilate or become thin. Follow these instructions at home:   Take over-the-counter and prescription medicines only as told by your health care provider.  Keep up with your usual exercises and follow other instructions from your health care provider.  Eat and drink lightly if you think you are going into labor.  If Braxton Hicks contractions are making you uncomfortable: ? Change your position from lying down or resting to walking, or change from walking to resting. ? Sit and rest in a tub of warm water. ? Drink enough fluid to keep your urine pale yellow. Dehydration may cause these contractions. ? Do slow and deep breathing several times an hour.  Keep all follow-up prenatal visits as told by your health care provider. This is important. Contact a health care provider if:  You have a fever.  You have continuous pain in your abdomen. Get help right away if:  Your contractions become stronger, more regular, and closer together.  You have fluid leaking or gushing from your vagina.  You pass blood-tinged mucus (bloody show).  You have bleeding from your vagina.  You have low back pain that you never had before.  You feel your baby's head pushing down and causing pelvic pressure.  Your baby is not moving inside you as much as it used to. Summary  Contractions that occur before labor are   called Braxton Hicks contractions, false labor, or practice contractions.  Braxton Hicks contractions are usually shorter, weaker, farther apart, and less regular than true labor contractions. True labor contractions usually become progressively stronger and regular, and they become more frequent.  Manage discomfort from Braxton Hicks contractions  by changing position, resting in a warm bath, drinking plenty of water, or practicing deep breathing. This information is not intended to replace advice given to you by your health care provider. Make sure you discuss any questions you have with your health care provider. Document Released: 10/15/2016 Document Revised: 05/14/2017 Document Reviewed: 10/15/2016 Elsevier Patient Education  2020 [employer] Inc.  

## 2018-12-26 NOTE — MAU Note (Signed)
I have communicated with J. Rasch, NP and reviewed vital signs:  Vitals:   12/26/18 0927 12/26/18 1148  BP: 123/79 (!) 97/56  Pulse: 85 71  Resp: 18 18  Temp: 98.4 F (36.9 C) 98.1 F (36.7 C)  SpO2: 100% 100%    Vaginal exam:  Dilation: 1.5 Effacement (%): 70 Cervical Position: Posterior Station: -3 Presentation: Vertex Exam by:: T Nolon Lennert RN ,   Also reviewed contraction pattern and that non-stress test is reactive.  It has been documented that patient is currently not contracting with no  cervical change since her discharge during the night  not indicating active labor.  Patient denies any other complaints.  Based on this report provider has given order for discharge.  A discharge order and diagnosis entered by a provider.   Labor discharge instructions reviewed with patient.

## 2018-12-27 ENCOUNTER — Encounter (HOSPITAL_COMMUNITY): Payer: Self-pay

## 2018-12-27 ENCOUNTER — Other Ambulatory Visit: Payer: Self-pay

## 2018-12-27 ENCOUNTER — Inpatient Hospital Stay (EMERGENCY_DEPARTMENT_HOSPITAL)
Admission: AD | Admit: 2018-12-27 | Discharge: 2018-12-28 | Disposition: A | Discharge status: Home / Self Care | Payer: Medicaid Other | Source: Home / Self Care | Attending: Obstetrics & Gynecology | Admitting: Obstetrics & Gynecology

## 2018-12-27 ENCOUNTER — Inpatient Hospital Stay (HOSPITAL_COMMUNITY)
Admission: AD | Admit: 2018-12-27 | Discharge: 2018-12-27 | Disposition: A | Discharge status: Home / Self Care | Payer: Medicaid Other | Attending: Obstetrics and Gynecology | Admitting: Obstetrics and Gynecology

## 2018-12-27 ENCOUNTER — Inpatient Hospital Stay (HOSPITAL_COMMUNITY): Payer: Medicaid Other

## 2018-12-27 DIAGNOSIS — O9989 Other specified diseases and conditions complicating pregnancy, childbirth and the puerperium: Secondary | ICD-10-CM

## 2018-12-27 DIAGNOSIS — O368930 Maternal care for other specified fetal problems, third trimester, not applicable or unspecified: Secondary | ICD-10-CM

## 2018-12-27 DIAGNOSIS — Z3A37 37 weeks gestation of pregnancy: Secondary | ICD-10-CM

## 2018-12-27 DIAGNOSIS — K219 Gastro-esophageal reflux disease without esophagitis: Secondary | ICD-10-CM | POA: Diagnosis not present

## 2018-12-27 DIAGNOSIS — O471 False labor at or after 37 completed weeks of gestation: Secondary | ICD-10-CM | POA: Insufficient documentation

## 2018-12-27 DIAGNOSIS — O479 False labor, unspecified: Secondary | ICD-10-CM | POA: Diagnosis not present

## 2018-12-27 DIAGNOSIS — O26893 Other specified pregnancy related conditions, third trimester: Secondary | ICD-10-CM | POA: Diagnosis not present

## 2018-12-27 DIAGNOSIS — M62830 Muscle spasm of back: Secondary | ICD-10-CM

## 2018-12-27 LAB — RAPID URINE DRUG SCREEN, HOSP PERFORMED
Amphetamines: NOT DETECTED
Barbiturates: NOT DETECTED
Benzodiazepines: NOT DETECTED
Cocaine: NOT DETECTED
Opiates: NOT DETECTED
Tetrahydrocannabinol: NOT DETECTED

## 2018-12-27 LAB — CBC WITH DIFFERENTIAL/PLATELET
Abs Immature Granulocytes: 0.04 10*3/uL (ref 0.00–0.07)
Basophils Absolute: 0 10*3/uL (ref 0.0–0.1)
Basophils Relative: 0 %
Eosinophils Absolute: 0 10*3/uL (ref 0.0–0.5)
Eosinophils Relative: 0 %
HCT: 31.8 % — ABNORMAL LOW (ref 36.0–46.0)
Hemoglobin: 10.4 g/dL — ABNORMAL LOW (ref 12.0–15.0)
Immature Granulocytes: 0 %
Lymphocytes Relative: 14 %
Lymphs Abs: 1.5 10*3/uL (ref 0.7–4.0)
MCH: 31 pg (ref 26.0–34.0)
MCHC: 32.7 g/dL (ref 30.0–36.0)
MCV: 94.9 fL (ref 80.0–100.0)
Monocytes Absolute: 0.9 10*3/uL (ref 0.1–1.0)
Monocytes Relative: 9 %
Neutro Abs: 7.7 10*3/uL (ref 1.7–7.7)
Neutrophils Relative %: 77 %
Platelets: 208 10*3/uL (ref 150–400)
RBC: 3.35 MIL/uL — ABNORMAL LOW (ref 3.87–5.11)
RDW: 13 % (ref 11.5–15.5)
WBC: 10.2 10*3/uL (ref 4.0–10.5)
nRBC: 0 % (ref 0.0–0.2)

## 2018-12-27 LAB — COMPREHENSIVE METABOLIC PANEL
ALT: 16 U/L (ref 0–44)
AST: 32 U/L (ref 15–41)
Albumin: 2.8 g/dL — ABNORMAL LOW (ref 3.5–5.0)
Alkaline Phosphatase: 167 U/L — ABNORMAL HIGH (ref 38–126)
Anion gap: 11 (ref 5–15)
BUN: <5 mg/dL — ABNORMAL LOW (ref 6–20)
CO2: 19 mmol/L — ABNORMAL LOW (ref 22–32)
Calcium: 9 mg/dL (ref 8.9–10.3)
Chloride: 109 mmol/L (ref 98–111)
Creatinine, Ser: 0.83 mg/dL (ref 0.44–1.00)
GFR calc Af Amer: >60 mL/min (ref >60)
GFR calc non Af Amer: >60 mL/min (ref >60)
Glucose, Bld: 82 mg/dL (ref 70–99)
Potassium: 3.3 mmol/L — ABNORMAL LOW (ref 3.5–5.1)
Sodium: 139 mmol/L (ref 135–145)
Total Bilirubin: 1.6 mg/dL — ABNORMAL HIGH (ref 0.3–1.2)
Total Protein: 6.1 g/dL — ABNORMAL LOW (ref 6.5–8.1)

## 2018-12-27 LAB — URINALYSIS, ROUTINE W REFLEX MICROSCOPIC
Bilirubin Urine: NEGATIVE
Glucose, UA: NEGATIVE mg/dL
Ketones, ur: 20 mg/dL — AB
Nitrite: NEGATIVE
Protein, ur: NEGATIVE mg/dL
Specific Gravity, Urine: 1.013 (ref 1.005–1.030)
pH: 6 (ref 5.0–8.0)

## 2018-12-27 MED ORDER — ZOLPIDEM TARTRATE 5 MG PO TABS: 5.0000 mg | ORAL_TABLET | Freq: Every evening | ORAL | 0 refills | Status: DC | PRN

## 2018-12-27 MED ORDER — NALBUPHINE HCL 10 MG/ML IJ SOLN
10.0000 mg | Freq: Once | INTRAMUSCULAR | Status: AC
  Administered 2018-12-27: 10 mg via INTRAMUSCULAR
  Filled 2018-12-27: qty 1

## 2018-12-27 MED ORDER — NALBUPHINE HCL 10 MG/ML IJ SOLN
10.0000 mg | Freq: Once | INTRAMUSCULAR | Status: DC
  Filled 2018-12-27: qty 1

## 2018-12-27 MED ORDER — PROMETHAZINE HCL 25 MG/ML IJ SOLN
25.0000 mg | Freq: Once | INTRAMUSCULAR | Status: AC
  Administered 2018-12-27: 25 mg via INTRAMUSCULAR
  Filled 2018-12-27: qty 1

## 2018-12-27 MED ORDER — LACTATED RINGERS IV BOLUS
1000.0000 mL | Freq: Once | INTRAVENOUS | Status: AC
  Administered 2018-12-27: 1000 mL via INTRAVENOUS

## 2018-12-27 MED ORDER — NALBUPHINE HCL 10 MG/ML IJ SOLN
10.0000 mg | Freq: Once | INTRAMUSCULAR | Status: AC
  Administered 2018-12-27: 10 mg via INTRAMUSCULAR

## 2018-12-27 NOTE — MAU Note (Signed)
Pt presents to MAU with complaints of contractions that started last night. PT denies any VB or LOF. +FM

## 2018-12-27 NOTE — MAU Note (Signed)
Pt reports to MAU c/o ctx every 2-3 min since Saturday. Pt states she is taking tylenol every 6 hours and it only works for 3 hours and then it don't work for the next 3 hours. Pt denies bleeding or LOF. +FM.  Pain 10+/10

## 2018-12-27 NOTE — MAU Provider Note (Signed)
History     CSN: [id number]  Arrival date and time: 12/27/18 0931   First Provider Initiated Contact with Patient 12/27/18 [phone]      Chief Complaint  Patient presents with  . Contractions   Elizabeth Shaffer is a 23 y.o. G1P0000 at 6079w3d who presents today with contractions and abdominal pain. This is her 4th visit here with the same pain in the last several days. She denies any VB or LOF. She reports normal fetal movement. She states that the pain is the same, but it is worse today. She reports that it was occurring all night, and she did not get any sleep.   Abdominal Pain This is a new problem. The current episode started in the past 7 days. The onset quality is gradual. The problem occurs intermittently. The problem has been gradually worsening. The pain is located in the generalized abdominal region, RLQ and RUQ. The pain is at a severity of 10/10. The pain is severe. The quality of the pain is sharp. The abdominal pain does not radiate. Associated symptoms include nausea. Pertinent negatives include no dysuria, fever, frequency or vomiting. Nothing aggravates the pain. The pain is relieved by nothing. She has tried nothing for the symptoms.    OB History    Gravida  1   Para  0   Term  0   Preterm  0   AB  0   Living  0     SAB  0   TAB  0   Ectopic  0   Multiple  0   Live Births  0           Past Medical History:  Diagnosis Date  . GERD (gastroesophageal reflux disease)   . Headache     Past Surgical History:  Procedure Laterality Date  . MOUTH SURGERY    . MOUTH SURGERY  2011  . MULTIPLE TOOTH EXTRACTIONS    . NO PAST SURGERIES    . WISDOM TOOTH EXTRACTION      Family History  Problem Relation Age of Onset  . Hypertension Mother     Social History   Tobacco Use  . Smoking status: Never Smoker  . Smokeless tobacco: Never Used  Substance Use Topics  . Alcohol use: Not Currently    Comment: on occ  . Drug use: Not Currently    Types:  Marijuana    Comment: none since November 2019    Allergies: No Known Allergies  Medications Prior to Admission  Medication Sig Dispense Refill Last Dose  . famotidine (PEPCID) 20 MG tablet Take 1 tablet (20 mg total) by mouth 2 (two) times daily. 60 tablet 0   . ferrous fumarate (HEMOCYTE - 106 MG FE) 325 (106 Fe) MG TABS tablet Take 1 tablet (106 mg of iron total) by mouth daily. 30 each 3   . ondansetron (ZOFRAN-ODT) 8 MG disintegrating tablet DISSOLVE 1 TABLET(8 MG) ON THE TONGUE EVERY 8 HOURS AS NEEDED FOR NAUSEA OR VOMITING 20 tablet 2   . Prenatal Vit-Fe Fumarate-FA (MULTIVITAMIN-PRENATAL) 27-0.8 MG TABS tablet Take 1 tablet by mouth daily at 12 noon.     Marland Kitchen. zolpidem (AMBIEN) 5 MG tablet Take 1 tablet (5 mg total) by mouth at bedtime as needed for sleep. 5 tablet 0     Review of Systems  Constitutional: Negative for chills and fever.  Gastrointestinal: Positive for abdominal pain and nausea. Negative for vomiting.  Genitourinary: Negative for dysuria, frequency, vaginal bleeding and vaginal discharge.  Physical Exam   Last menstrual period 03/16/2018.  Physical Exam  Nursing note and vitals reviewed. Constitutional: She is oriented to person, place, and time. She appears well-developed and well-nourished. No distress.  HENT:  Head: Normocephalic.  Cardiovascular: Normal rate.  Respiratory: Effort normal.  GI: Soft. There is no abdominal tenderness. There is no rebound.  Neurological: She is alert and oriented to person, place, and time.  Skin: Skin is warm and dry.  Psychiatric: She has a normal mood and affect.   Dilation: 1.5 Effacement (%): Thick Cervical Position: Posterior Station: -3 Presentation: Vertex Exam by:: J Hashem RN Unchanged from prior visits  NST:  Baseline: 140 Variability: moderate Accels: 15x15 Decels: none Toco: irregular  Results for orders placed or performed during the hospital encounter of 12/27/18 (from the past 24 hour(s))  CBC  with Differential/Platelet     Status: Abnormal   Collection Time: 12/27/18  9:48 AM  Result Value Ref Range   WBC 10.2 4.0 - 10.5 K/uL   RBC 3.35 (L) 3.87 - 5.11 MIL/uL   Hemoglobin 10.4 (L) 12.0 - 15.0 g/dL   HCT 47.831.8 (L) 29.536.0 - 62.146.0 %   MCV 94.9 80.0 - 100.0 fL   MCH 31.0 26.0 - 34.0 pg   MCHC 32.7 30.0 - 36.0 g/dL   RDW 30.813.0 65.711.5 - 84.615.5 %   Platelets 208 150 - 400 K/uL   nRBC 0.0 0.0 - 0.2 %   Neutrophils Relative % 77 %   Neutro Abs 7.7 1.7 - 7.7 K/uL   Lymphocytes Relative 14 %   Lymphs Abs 1.5 0.7 - 4.0 K/uL   Monocytes Relative 9 %   Monocytes Absolute 0.9 0.1 - 1.0 K/uL   Eosinophils Relative 0 %   Eosinophils Absolute 0.0 0.0 - 0.5 K/uL   Basophils Relative 0 %   Basophils Absolute 0.0 0.0 - 0.1 K/uL   Immature Granulocytes 0 %   Abs Immature Granulocytes 0.04 0.00 - 0.07 K/uL  Comprehensive metabolic panel     Status: Abnormal   Collection Time: 12/27/18  9:48 AM  Result Value Ref Range   Sodium 139 135 - 145 mmol/L   Potassium 3.3 (L) 3.5 - 5.1 mmol/L   Chloride 109 98 - 111 mmol/L   CO2 19 (L) 22 - 32 mmol/L   Glucose, Bld 82 70 - 99 mg/dL   BUN <5 (L) 6 - 20 mg/dL   Creatinine, Ser 9.620.83 0.44 - 1.00 mg/dL   Calcium 9.0 8.9 - 95.210.3 mg/dL   Total Protein 6.1 (L) 6.5 - 8.1 g/dL   Albumin 2.8 (L) 3.5 - 5.0 g/dL   AST 32 15 - 41 U/L   ALT 16 0 - 44 U/L   Alkaline Phosphatase 167 (H) 38 - 126 U/L   Total Bilirubin 1.6 (H) 0.3 - 1.2 mg/dL   GFR calc non Af Amer >60 >60 mL/min   GFR calc Af Amer >60 >60 mL/min   Anion gap 11 5 - 15  Urinalysis, Routine w reflex microscopic     Status: Abnormal   Collection Time: 12/27/18 10:23 AM  Result Value Ref Range   Color, Urine YELLOW YELLOW   APPearance HAZY (A) CLEAR   Specific Gravity, Urine 1.013 1.005 - 1.030   pH 6.0 5.0 - 8.0   Glucose, UA NEGATIVE NEGATIVE mg/dL   Hgb urine dipstick MODERATE (A) NEGATIVE   Bilirubin Urine NEGATIVE NEGATIVE   Ketones, ur 20 (A) NEGATIVE mg/dL   Protein, ur NEGATIVE NEGATIVE  mg/dL   Nitrite NEGATIVE NEGATIVE   Leukocytes,Ua SMALL (A) NEGATIVE   RBC / HPF 11-20 0 - 5 RBC/hpf   WBC, UA 11-20 0 - 5 WBC/hpf   Bacteria, UA FEW (A) NONE SEEN   Squamous Epithelial / LPF 6-10 0 - 5   Mucus PRESENT   Urine rapid drug screen (hosp performed)     Status: None   Collection Time: 12/27/18 10:23 AM  Result Value Ref Range   Opiates NONE DETECTED NONE DETECTED   Cocaine NONE DETECTED NONE DETECTED   Benzodiazepines NONE DETECTED NONE DETECTED   Amphetamines NONE DETECTED NONE DETECTED   Tetrahydrocannabinol NONE DETECTED NONE DETECTED   Barbiturates NONE DETECTED NONE DETECTED    BPP: 6/8 NST reactive   MAU Course  Procedures  MDM @1055  patient had a 29min long decel to a nadir of 90 with spontaneous return to baseline. Patient was sleeping comfortably during this time, and was woken to change position. Will give a second bolus of fluids and continue to monitor.   1:42 PM DW Dr. Ilda Basset reviewed FHR tracing, BPP results, lab results. Patient is ok for DC home. Will have her next visit be switched to an in person visit.   1:48 PM cervix rechecked, no changed   Assessment and Plan   1. False labor   2. [redacted] weeks gestation of pregnancy    DC home Comfort measures reviewed  3rd Trimester precautions  labor precautions  Fetal kick counts RX: ambien 5mg  PRN #5  Return to MAU as needed FU with OB as planned  [place] for [facility] Follow up.   Specialty: Obstetrics and Gynecology Contact information: [address], [address] [id number] [address]c [place] [id number] [place] DNP, CNM  12/27/18  1:52 PM

## 2018-12-27 NOTE — Discharge Instructions (Signed)
Braxton Hicks Contractions Contractions of the uterus can occur throughout pregnancy, but they are not always a sign that you are in labor. You may have practice contractions called Braxton Hicks contractions. These false labor contractions are sometimes confused with true labor. What are Braxton Hicks contractions? Braxton Hicks contractions are tightening movements that occur in the muscles of the uterus before labor. Unlike true labor contractions, these contractions do not result in opening (dilation) and thinning of the cervix. Toward the end of pregnancy (32-34 weeks), Braxton Hicks contractions can happen more often and may become stronger. These contractions are sometimes difficult to tell apart from true labor because they can be very uncomfortable. You should not feel embarrassed if you go to the hospital with false labor. Sometimes, the only way to tell if you are in true labor is for your health care provider to look for changes in the cervix. The health care provider will do a physical exam and may monitor your contractions. If you are not in true labor, the exam should show that your cervix is not dilating and your water has not broken. If there are no other health problems associated with your pregnancy, it is completely safe for you to be sent home with false labor. You may continue to have Braxton Hicks contractions until you go into true labor. How to tell the difference between true labor and false labor True labor  Contractions last 30-70 seconds.  Contractions become very regular.  Discomfort is usually felt in the top of the uterus, and it spreads to the lower abdomen and low back.  Contractions do not go away with walking.  Contractions usually become more intense and increase in frequency.  The cervix dilates and gets thinner. False labor  Contractions are usually shorter and not as strong as true labor contractions.  Contractions are usually irregular.  Contractions  are often felt in the front of the lower abdomen and in the groin.  Contractions may go away when you walk around or change positions while lying down.  Contractions get weaker and are shorter-lasting as time goes on.  The cervix usually does not dilate or become thin. Follow these instructions at home:   Take over-the-counter and prescription medicines only as told by your health care provider.  Keep up with your usual exercises and follow other instructions from your health care provider.  Eat and drink lightly if you think you are going into labor.  If Braxton Hicks contractions are making you uncomfortable: ? Change your position from lying down or resting to walking, or change from walking to resting. ? Sit and rest in a tub of warm water. ? Drink enough fluid to keep your urine pale yellow. Dehydration may cause these contractions. ? Do slow and deep breathing several times an hour.  Keep all follow-up prenatal visits as told by your health care provider. This is important. Contact a health care provider if:  You have a fever.  You have continuous pain in your abdomen. Get help right away if:  Your contractions become stronger, more regular, and closer together.  You have fluid leaking or gushing from your vagina.  You pass blood-tinged mucus (bloody show).  You have bleeding from your vagina.  You have low back pain that you never had before.  You feel your baby's head pushing down and causing pelvic pressure.  Your baby is not moving inside you as much as it used to. Summary  Contractions that occur before labor are   called Braxton Hicks contractions, false labor, or practice contractions.  Braxton Hicks contractions are usually shorter, weaker, farther apart, and less regular than true labor contractions. True labor contractions usually become progressively stronger and regular, and they become more frequent.  Manage discomfort from Braxton Hicks contractions  by changing position, resting in a warm bath, drinking plenty of water, or practicing deep breathing. This information is not intended to replace advice given to you by your health care provider. Make sure you discuss any questions you have with your health care provider. Document Released: 10/15/2016 Document Revised: 05/14/2017 Document Reviewed: 10/15/2016 Elsevier Patient Education  2020 [employer] Inc.  

## 2018-12-28 DIAGNOSIS — Z3A37 37 weeks gestation of pregnancy: Secondary | ICD-10-CM

## 2018-12-28 DIAGNOSIS — O471 False labor at or after 37 completed weeks of gestation: Secondary | ICD-10-CM

## 2018-12-28 LAB — CULTURE, OB URINE: Culture: NO GROWTH

## 2018-12-28 MED ORDER — CYCLOBENZAPRINE HCL 5 MG PO TABS: 5.0000 mg | ORAL_TABLET | Freq: Three times a day (TID) | ORAL | 0 refills | Status: DC | PRN

## 2018-12-28 MED ORDER — CYCLOBENZAPRINE HCL 10 MG PO TABS
10.0000 mg | ORAL_TABLET | Freq: Once | ORAL | Status: AC
  Administered 2018-12-28: 10 mg via ORAL
  Filled 2018-12-28: qty 1

## 2018-12-28 MED ORDER — OXYCODONE-ACETAMINOPHEN 5-325 MG PO TABS
1.0000 | ORAL_TABLET | Freq: Once | ORAL | Status: AC
  Administered 2018-12-28: 1 via ORAL
  Filled 2018-12-28: qty 1

## 2018-12-28 NOTE — MAU Provider Note (Signed)
Chief Complaint:  Contractions   First Provider Initiated Contact with Patient 12/28/18 0031      HPI: Elizabeth DykeKendra Shaffer is a 23 y.o. G1P0000 at 5637w4dwho presents to maternity admissions reporting painful uterine contractions.  No relief from Tylenol or rest. She reports good fetal movement, denies LOF, vaginal bleeding, vaginal itching/burning, urinary symptoms, h/a, dizziness, n/v, diarrhea, constipation or fever/chills.  She denies headache, visual changes or RUQ abdominal pain.  RN was proceeding with labor evaluation when patient complained of a constant pain in her right lower back that would not stop. I was asked to evaluate her  RN Note: Pt reports to MAU c/o ctx every 2-3 min since Saturday. Pt states she is taking tylenol every 6 hours and it only works for 3 hours and then it don't work for the next 3 hours. Pt denies bleeding or LOF. +FM.  Pain 10+/10  Past Medical History: Past Medical History:  Diagnosis Date  . GERD (gastroesophageal reflux disease)   . Headache     Past obstetric history: OB History  Gravida Para Term Preterm AB Living  1 0 0 0 0 0  SAB TAB Ectopic Multiple Live Births  0 0 0 0 0    # Outcome Date GA Lbr Len/2nd Weight Sex Delivery Anes PTL Lv  1 Current             Past Surgical History: Past Surgical History:  Procedure Laterality Date  . MOUTH SURGERY    . MOUTH SURGERY  2011  . MULTIPLE TOOTH EXTRACTIONS    . NO PAST SURGERIES    . WISDOM TOOTH EXTRACTION      Family History: Family History  Problem Relation Age of Onset  . Hypertension Mother     Social History: Social History   Tobacco Use  . Smoking status: Never Smoker  . Smokeless tobacco: Never Used  Substance Use Topics  . Alcohol use: Not Currently    Comment: on occ  . Drug use: Not Currently    Types: Marijuana    Comment: none since November 2019    Allergies: No Known Allergies  Meds:  Medications Prior to Admission  Medication Sig Dispense Refill Last Dose   . famotidine (PEPCID) 20 MG tablet Take 1 tablet (20 mg total) by mouth 2 (two) times daily. 60 tablet 0 Past Month at Unknown time  . ondansetron (ZOFRAN-ODT) 8 MG disintegrating tablet DISSOLVE 1 TABLET(8 MG) ON THE TONGUE EVERY 8 HOURS AS NEEDED FOR NAUSEA OR VOMITING 20 tablet 2 Past Week at Unknown time  . Prenatal Vit-Fe Fumarate-FA (MULTIVITAMIN-PRENATAL) 27-0.8 MG TABS tablet Take 1 tablet by mouth daily at 12 noon.   Past Week at Unknown time  . ferrous fumarate (HEMOCYTE - 106 MG FE) 325 (106 Fe) MG TABS tablet Take 1 tablet (106 mg of iron total) by mouth daily. 30 each 3   . zolpidem (AMBIEN) 5 MG tablet Take 1 tablet (5 mg total) by mouth at bedtime as needed for sleep. 5 tablet 0     I have reviewed patient's Past Medical Hx, Surgical Hx, Family Hx, Social Hx, medications and allergies.   ROS:  Review of Systems  Constitutional: Negative for chills and fever.  Respiratory: Negative for shortness of breath.   Gastrointestinal: Positive for abdominal pain. Negative for constipation, diarrhea, nausea and vomiting.  Genitourinary: Positive for pelvic pain. Negative for vaginal bleeding.  Musculoskeletal: Positive for back pain.  Neurological: Negative for dizziness and weakness.   Other systems  negative  Physical Exam   Patient Vitals for the past 24 hrs:  BP Temp Temp src Pulse Resp Weight  12/27/18 2137 126/80 - - 69 - -  12/27/18 2121 136/84 98.7 F (37.1 C) Oral 68 19 -  12/27/18 2118 - - - - - 64.7 kg   Constitutional: Well-developed, well-nourished female in no acute distress.  Cardiovascular: normal rate and rhythm Respiratory: normal effort, clear to auscultation bilaterally GI: Abd soft, non-tender, gravid appropriate for gestational age.   No rebound or guarding. MS: Extremities nontender, no edema, normal ROM   Point tenderness to right lower lumbar spine, muscles spasming.  Neurologic: Alert and oriented x 4.  GU: Neg CVAT.  PELVIC EXAM:  Dilation:  (attempted cervical exam) Effacement (%): 80 Station: -2 Presentation: Vertex Exam by:: Zenia ResidesNikki Risheq, RN Cervix unchanged over last several days 1-2cm  FHT:  Baseline 140 , moderate variability, accelerations present, no decelerations Contractions:  Irregular     Labs: Results for orders placed or performed during the hospital encounter of 12/27/18 (from the past 24 hour(s))  CBC with Differential/Platelet     Status: Abnormal   Collection Time: 12/27/18  9:48 AM  Result Value Ref Range   WBC 10.2 4.0 - 10.5 K/uL   RBC 3.35 (L) 3.87 - 5.11 MIL/uL   Hemoglobin 10.4 (L) 12.0 - 15.0 g/dL   HCT 16.131.8 (L) 09.636.0 - 04.546.0 %   MCV 94.9 80.0 - 100.0 fL   MCH 31.0 26.0 - 34.0 pg   MCHC 32.7 30.0 - 36.0 g/dL   RDW 40.913.0 81.111.5 - 91.415.5 %   Platelets 208 150 - 400 K/uL   nRBC 0.0 0.0 - 0.2 %   Neutrophils Relative % 77 %   Neutro Abs 7.7 1.7 - 7.7 K/uL   Lymphocytes Relative 14 %   Lymphs Abs 1.5 0.7 - 4.0 K/uL   Monocytes Relative 9 %   Monocytes Absolute 0.9 0.1 - 1.0 K/uL   Eosinophils Relative 0 %   Eosinophils Absolute 0.0 0.0 - 0.5 K/uL   Basophils Relative 0 %   Basophils Absolute 0.0 0.0 - 0.1 K/uL   Immature Granulocytes 0 %   Abs Immature Granulocytes 0.04 0.00 - 0.07 K/uL  Comprehensive metabolic panel     Status: Abnormal   Collection Time: 12/27/18  9:48 AM  Result Value Ref Range   Sodium 139 135 - 145 mmol/L   Potassium 3.3 (L) 3.5 - 5.1 mmol/L   Chloride 109 98 - 111 mmol/L   CO2 19 (L) 22 - 32 mmol/L   Glucose, Bld 82 70 - 99 mg/dL   BUN <5 (L) 6 - 20 mg/dL   Creatinine, Ser 7.820.83 0.44 - 1.00 mg/dL   Calcium 9.0 8.9 - 95.610.3 mg/dL   Total Protein 6.1 (L) 6.5 - 8.1 g/dL   Albumin 2.8 (L) 3.5 - 5.0 g/dL   AST 32 15 - 41 U/L   ALT 16 0 - 44 U/L   Alkaline Phosphatase 167 (H) 38 - 126 U/L   Total Bilirubin 1.6 (H) 0.3 - 1.2 mg/dL   GFR calc non Af Amer >60 >60 mL/min   GFR calc Af Amer >60 >60 mL/min   Anion gap 11 5 - 15  Urinalysis, Routine w reflex microscopic      Status: Abnormal   Collection Time: 12/27/18 10:23 AM  Result Value Ref Range   Color, Urine YELLOW YELLOW   APPearance HAZY (A) CLEAR   Specific Gravity, Urine 1.013 1.005 -  1.030   pH 6.0 5.0 - 8.0   Glucose, UA NEGATIVE NEGATIVE mg/dL   Hgb urine dipstick MODERATE (A) NEGATIVE   Bilirubin Urine NEGATIVE NEGATIVE   Ketones, ur 20 (A) NEGATIVE mg/dL   Protein, ur NEGATIVE NEGATIVE mg/dL   Nitrite NEGATIVE NEGATIVE   Leukocytes,Ua SMALL (A) NEGATIVE   RBC / HPF 11-20 0 - 5 RBC/hpf   WBC, UA 11-20 0 - 5 WBC/hpf   Bacteria, UA FEW (A) NONE SEEN   Squamous Epithelial / LPF 6-10 0 - 5   Mucus PRESENT   Urine rapid drug screen (hosp performed)     Status: None   Collection Time: 12/27/18 10:23 AM  Result Value Ref Range   Opiates NONE DETECTED NONE DETECTED   Cocaine NONE DETECTED NONE DETECTED   Benzodiazepines NONE DETECTED NONE DETECTED   Amphetamines NONE DETECTED NONE DETECTED   Tetrahydrocannabinol NONE DETECTED NONE DETECTED   Barbiturates NONE DETECTED NONE DETECTED   B/Positive/-- (02/04 1030)  Imaging:  No results found.  MAU Course/MDM: I have ordered labs and reviewed results.  NST reviewed, reactive.  Treatments in MAU included Flexeril and Percocet which relieved her pain. .  Urine sent to culture, though low suspicion for pyelonephritis.  Pain and exam are consistent with lumbar muscle spasm  Assessment: [place]In[place] intrauterine pregnancy at [redacted]w[redacted]d Prodromal contractions, not in labor Lumbar muscle spasm  Plan: Discharge home Labor precautions and fetal kick counts Rx Flexeril for PRN use Follow up in Office for prenatal visits and recheck  Encouraged to return here or to other Urgent Care/ED if she develops worsening of symptoms, increase in pain, fever, or other concerning symptoms.   Pt stable at time of discharge.  Hansel Feinstein CNM, MSN Certified Nurse-Midwife 12/28/2018 12:31 AM

## 2018-12-28 NOTE — Discharge Instructions (Signed)
Heat Therapy Heat therapy is the use of heat to help ease sore, stiff, injured, and tight muscles and joints. Heat relaxes muscles, which may help relieve pain and muscle spasms. What are the risks? If you have any of the following conditions, do not use heat therapy unless your health care provider approves:  New bruises.  Open wounds.  Healing wounds, infected skin, or scarred skin in the area being treated.  Poor circulation.  Numbness in the area being treated.  Unusual swelling of the area being treated.  Blood clots.  Diabetes or heart disease.  Cancer.  Inability to communicate pain. This may include young children and people who have problems with their brain function (dementia). How to use heat therapy Use the heat source that your health care provider recommends, such as:  Moist heat pack.  Hot water bottle.  Electric heating pad.  Heated gel pack.  Heated wrap.  Warm water bath. Follow your health care provider's instructions about when and how to use heat therapy. In general, you should: 1. Place a towel between your skin and the heat source. 2. Leave the heat on for 20-30 minutes. Your skin may turn pink. 3. Remove the heat if your skin turns bright red. This is especially important if you are unable to feel pain, heat, or cold. You may have a greater risk of getting burned. If directed, you can also soak in a warm water bath. To prepare: 1. Put a non-slip padding in the bathtub to prevent slips or falls. 2. Fill a bathtub with warm water. 3. Always check the water temperature before getting into the bathtub. 4. Soak in the water for 15-20 minutes, or for as long as you are told by your health care provider. 5. When you are done, be careful when you stand up. You may feel dizzy. 6. After the bath, pat yourself dry. Do not rub your skin to dry it. General recommendations  Be careful to avoid burning your skin. High heat or long exposure to heat can cause  burns.  Check your skin during heat therapy.  Do not use heat therapy: ? For new injuries, especially if you have swelling on the injured area. ? On areas of skin that are already irritated, such as with a rash or sunburn. ? While sleeping. Only use heat therapy while you are awake. ? If your skin turns bright red. Contact a health care provider if you have:  Blisters, redness, swelling, or numbness in the area where you use heat therapy.  New pain.  Pain that gets worse. Summary  Heat therapy is the use of heat to help ease sore, stiff, injured, and tight muscles and joints. Heat relaxes muscles, which may help relieve pain.  If you have the following conditions, do not use heat therapy unless your health care provider approves: poor circulation, healing wounds, diabetes, numbness or swelling in the area being treated, blood clots, cancer, or the inability to communicate pain.  Be careful to avoid burning your skin. Only use heat therapy while you are awake.  Follow your health care provider's instructions about when and how to use heat therapy. This information is not intended to replace advice given to you by your health care provider. Make sure you discuss any questions you have with your health care provider. Document Released: 08/24/2011 Document Revised: 07/25/2018 Document Reviewed: 06/12/2017 Elsevier Patient Education  2020 [employer]  Vaginal Delivery  Vaginal delivery means that you give birth by pushing your  baby out of your birth canal (vagina). A team of health care providers will help you before, during, and after vaginal delivery. Birth experiences are unique for every woman and every pregnancy, and birth experiences vary depending on where you choose to give birth. What happens when I arrive at the birth center or hospital? Once you are in labor and have been admitted into the hospital or birth center, your health care provider may:  Review your pregnancy  history and any concerns that you have.  Insert an IV into one of your veins. This may be used to give you fluids and medicines.  Check your blood pressure, pulse, temperature, and heart rate (vital signs).  Check whether your bag of water (amniotic sac) has broken (ruptured).  Talk with you about your birth plan and discuss pain control options. Monitoring Your health care provider may monitor your contractions (uterine monitoring) and your baby's heart rate (fetal monitoring). You may need to be monitored:  Often, but not continuously (intermittently).  All the time or for long periods at a time (continuously). Continuous monitoring may be needed if: ? You are taking certain medicines, such as medicine to relieve pain or make your contractions stronger. ? You have pregnancy or labor complications. Monitoring may be done by:  Placing a special stethoscope or a handheld monitoring device on your abdomen to check your baby's heartbeat and to check for contractions.  Placing monitors on your abdomen (external monitors) to record your baby's heartbeat and the frequency and length of contractions.  Placing monitors inside your uterus through your vagina (internal monitors) to record your baby's heartbeat and the frequency, length, and strength of your contractions. Depending on the type of monitor, it may remain in your uterus or on your baby's head until birth.  Telemetry. This is a type of continuous monitoring that can be done with external or internal monitors. Instead of having to stay in bed, you are able to move around during telemetry. Physical exam Your health care provider may perform frequent physical exams. This may include:  Checking how and where your baby is positioned in your uterus.  Checking your cervix to determine: ? Whether it is thinning out (effacing). ? Whether it is opening up (dilating). What happens during labor and delivery?  Normal labor and delivery is  divided into the following three stages: Stage 1  This is the longest stage of labor.  This stage can last for hours or days.  Throughout this stage, you will feel contractions. Contractions generally feel mild, infrequent, and irregular at first. They get stronger, more frequent (about every 2-3 minutes), and more regular as you move through this stage.  This stage ends when your cervix is completely dilated to 4 inches (10 cm) and completely effaced. Stage 2  This stage starts once your cervix is completely effaced and dilated and lasts until the delivery of your baby.  This stage may last from 20 minutes to 2 hours.  This is the stage where you will feel an urge to push your baby out of your vagina.  You may feel stretching and burning pain, especially when the widest part of your baby's head passes through the vaginal opening (crowning).  Once your baby is delivered, the umbilical cord will be clamped and cut. This usually occurs after waiting a period of 1-2 minutes after delivery.  Your baby will be placed on your bare chest (skin-to-skin contact) in an upright position and covered with a warm blanket.  Watch your baby for feeding cues, like rooting or sucking, and help the baby to your breast for his or her first feeding. Stage 3  This stage starts immediately after the birth of your baby and ends after you deliver the placenta.  This stage may take anywhere from 5 to 30 minutes.  After your baby has been delivered, you will feel contractions as your body expels the placenta and your uterus contracts to control bleeding. What can I expect after labor and delivery?  After labor is over, you and your baby will be monitored closely until you are ready to go home to ensure that you are both healthy. Your health care team will teach you how to care for yourself and your baby.  You and your baby will stay in the same room (rooming in) during your hospital stay. This will encourage  early bonding and successful breastfeeding.  You may continue to receive fluids and medicines through an IV.  Your uterus will be checked and massaged regularly (fundal massage).  You will have some soreness and pain in your abdomen, vagina, and the area of skin between your vaginal opening and your anus (perineum).  If an incision was made near your vagina (episiotomy) or if you had some vaginal tearing during delivery, cold compresses may be placed on your episiotomy or your tear. This helps to reduce pain and swelling.  You may be given a squirt bottle to use instead of wiping when you go to the bathroom. To use the squirt bottle, follow these steps: ? Before you urinate, fill the squirt bottle with warm water. Do not use hot water. ? After you urinate, while you are sitting on the toilet, use the squirt bottle to rinse the area around your urethra and vaginal opening. This rinses away any urine and blood. ? Fill the squirt bottle with clean water every time you use the bathroom.  It is normal to have vaginal bleeding after delivery. Wear a sanitary pad for vaginal bleeding and discharge. Summary  Vaginal delivery means that you will give birth by pushing your baby out of your birth canal (vagina).  Your health care provider may monitor your contractions (uterine monitoring) and your baby's heart rate (fetal monitoring).  Your health care provider may perform a physical exam.  Normal labor and delivery is divided into three stages.  After labor is over, you and your baby will be monitored closely until you are ready to go home. This information is not intended to replace advice given to you by your health care provider. Make sure you discuss any questions you have with your health care provider. Document Released: 03/10/2008 Document Revised: 07/06/2017 Document Reviewed: 07/06/2017 Elsevier Patient Education  2020 [employer]

## 2018-12-29 ENCOUNTER — Encounter: Payer: Self-pay | Admitting: Medical

## 2018-12-29 ENCOUNTER — Inpatient Hospital Stay (HOSPITAL_COMMUNITY): Payer: Medicaid Other

## 2018-12-29 ENCOUNTER — Encounter (HOSPITAL_COMMUNITY): Payer: Self-pay

## 2018-12-29 ENCOUNTER — Inpatient Hospital Stay (HOSPITAL_COMMUNITY)
Admission: AD | Admit: 2018-12-29 | Discharge: 2018-12-30 | Disposition: A | Discharge status: Home / Self Care | Payer: Medicaid Other | Attending: Family Medicine | Admitting: Family Medicine

## 2018-12-29 ENCOUNTER — Other Ambulatory Visit: Payer: Self-pay

## 2018-12-29 ENCOUNTER — Ambulatory Visit (INDEPENDENT_AMBULATORY_CARE_PROVIDER_SITE_OTHER): Payer: Medicaid Other | Admitting: Medical

## 2018-12-29 VITALS — BP 109/72 | HR 95 | Temp 98.4°F | Wt 142.8 lb

## 2018-12-29 DIAGNOSIS — O99613 Diseases of the digestive system complicating pregnancy, third trimester: Secondary | ICD-10-CM | POA: Diagnosis not present

## 2018-12-29 DIAGNOSIS — Z3A37 37 weeks gestation of pregnancy: Secondary | ICD-10-CM | POA: Insufficient documentation

## 2018-12-29 DIAGNOSIS — O26893 Other specified pregnancy related conditions, third trimester: Secondary | ICD-10-CM | POA: Insufficient documentation

## 2018-12-29 DIAGNOSIS — R1031 Right lower quadrant pain: Secondary | ICD-10-CM | POA: Insufficient documentation

## 2018-12-29 DIAGNOSIS — Z79899 Other long term (current) drug therapy: Secondary | ICD-10-CM | POA: Insufficient documentation

## 2018-12-29 DIAGNOSIS — O4700 False labor before 37 completed weeks of gestation, unspecified trimester: Secondary | ICD-10-CM

## 2018-12-29 DIAGNOSIS — O479 False labor, unspecified: Secondary | ICD-10-CM | POA: Diagnosis not present

## 2018-12-29 DIAGNOSIS — Z3400 Encounter for supervision of normal first pregnancy, unspecified trimester: Secondary | ICD-10-CM

## 2018-12-29 DIAGNOSIS — O471 False labor at or after 37 completed weeks of gestation: Secondary | ICD-10-CM | POA: Diagnosis present

## 2018-12-29 DIAGNOSIS — Z3403 Encounter for supervision of normal first pregnancy, third trimester: Secondary | ICD-10-CM

## 2018-12-29 DIAGNOSIS — K219 Gastro-esophageal reflux disease without esophagitis: Secondary | ICD-10-CM | POA: Diagnosis not present

## 2018-12-29 LAB — CBC
HCT: 33.7 % — ABNORMAL LOW (ref 36.0–46.0)
Hemoglobin: 11.2 g/dL — ABNORMAL LOW (ref 12.0–15.0)
MCH: 30.7 pg (ref 26.0–34.0)
MCHC: 33.2 g/dL (ref 30.0–36.0)
MCV: 92.3 fL (ref 80.0–100.0)
Platelets: 214 10*3/uL (ref 150–400)
RBC: 3.65 MIL/uL — ABNORMAL LOW (ref 3.87–5.11)
RDW: 12.8 % (ref 11.5–15.5)
WBC: 9.5 10*3/uL (ref 4.0–10.5)
nRBC: 0 % (ref 0.0–0.2)

## 2018-12-29 LAB — URINALYSIS, ROUTINE W REFLEX MICROSCOPIC
Bilirubin Urine: NEGATIVE
Glucose, UA: NEGATIVE mg/dL
Hgb urine dipstick: NEGATIVE
Ketones, ur: 80 mg/dL — AB
Nitrite: NEGATIVE
Protein, ur: 30 mg/dL — AB
Specific Gravity, Urine: 1.02 (ref 1.005–1.030)
pH: 6 (ref 5.0–8.0)

## 2018-12-29 LAB — POCT FERN TEST: POCT Fern Test: NEGATIVE

## 2018-12-29 MED ORDER — ONDANSETRON 4 MG PO TBDP
4.0000 mg | ORAL_TABLET | Freq: Once | ORAL | Status: AC
  Administered 2018-12-29: 4 mg via ORAL
  Filled 2018-12-29: qty 1

## 2018-12-29 MED ORDER — OXYCODONE-ACETAMINOPHEN 5-325 MG PO TABS
1.0000 | ORAL_TABLET | Freq: Once | ORAL | Status: AC
  Administered 2018-12-29: 1 via ORAL
  Filled 2018-12-29: qty 1

## 2018-12-29 MED ORDER — OXYCODONE-ACETAMINOPHEN 5-325 MG PO TABS: 1.0000 | ORAL_TABLET | Freq: Once | ORAL | Status: DC

## 2018-12-29 NOTE — Progress Notes (Signed)
Pt states is still having a lot of pain on sides & back.

## 2018-12-29 NOTE — MAU Provider Note (Signed)
prChief Complaint:  Contractions, Rupture of Membranes, and Pelvic Pain   First Provider Initiated Contact with Patient 12/29/18 2223      HPI: Elizabeth Shaffer is a 23 y.o. G1P0000 at 14w5dwho presents to maternity admissions reporting pain in right lower abdomen. Comes and goes.  Does not correlate with contractions. Seen two days ago and felt better with Percocet.  . She reports good fetal movement, denies LOF, vaginal bleeding, vaginal itching/burning, urinary symptoms, h/a, dizziness, n/v, diarrhea, constipation or fever/chills.  She denies headache, visual changes or RUQ abdominal pain.  RN Note: Since last Friday, really sharp pain in low abd, was coming and going and now its been constant x 2-3 days.  At 9:30 pm, felt something wet in underwear.  No bleeding. Baby moving well.  This pain is different then the contractions.  Unbearable for a week.  Taking warm baths, tried Flexeril, Tylenol, nothing is helping.   Past Medical History: Past Medical History:  Diagnosis Date  . GERD (gastroesophageal reflux disease)   . Headache     Past obstetric history: OB History  Gravida Para Term Preterm AB Living  1 0 0 0 0 0  SAB TAB Ectopic Multiple Live Births  0 0 0 0 0    # Outcome Date GA Lbr Len/2nd Weight Sex Delivery Anes PTL Lv  1 Current             Past Surgical History: Past Surgical History:  Procedure Laterality Date  . MOUTH SURGERY    . MOUTH SURGERY  2011  . MULTIPLE TOOTH EXTRACTIONS    . NO PAST SURGERIES    . WISDOM TOOTH EXTRACTION      Family History: Family History  Problem Relation Age of Onset  . Hypertension Mother     Social History: Social History   Tobacco Use  . Smoking status: Never Smoker  . Smokeless tobacco: Never Used  Substance Use Topics  . Alcohol use: Not Currently    Comment: on occ  . Drug use: Not Currently    Types: Marijuana    Comment: none since November 2019    Allergies: No Known Allergies  Meds:  Medications Prior  to Admission  Medication Sig Dispense Refill Last Dose  . acetaminophen (TYLENOL) 500 MG tablet Take 1,000 mg by mouth every 6 (six) hours as needed.   12/29/2018 at Unknown time  . cyclobenzaprine (FLEXERIL) 5 MG tablet Take 1 tablet (5 mg total) by mouth 3 (three) times daily as needed for muscle spasms. 20 tablet 0 12/29/2018 at Unknown time  . Prenatal Vit-Fe Fumarate-FA (MULTIVITAMIN-PRENATAL) 27-0.8 MG TABS tablet Take 1 tablet by mouth daily at 12 noon.   12/28/2018 at Unknown time  . zolpidem (AMBIEN) 5 MG tablet Take 1 tablet (5 mg total) by mouth at bedtime as needed for sleep. 5 tablet 0 12/28/2018 at Unknown time  . famotidine (PEPCID) 20 MG tablet Take 1 tablet (20 mg total) by mouth 2 (two) times daily. 60 tablet 0   . ferrous fumarate (HEMOCYTE - 106 MG FE) 325 (106 Fe) MG TABS tablet Take 1 tablet (106 mg of iron total) by mouth daily. (Patient not taking: Reported on 12/29/2018) 30 each 3   . ondansetron (ZOFRAN-ODT) 8 MG disintegrating tablet DISSOLVE 1 TABLET(8 MG) ON THE TONGUE EVERY 8 HOURS AS NEEDED FOR NAUSEA OR VOMITING 20 tablet 2     I have reviewed patient's Past Medical Hx, Surgical Hx, Family Hx, Social Hx, medications and allergies.  ROS:  Review of Systems  Constitutional: Negative for chills and fever.  Respiratory: Negative for shortness of breath.   Gastrointestinal: Positive for abdominal pain. Negative for constipation, diarrhea, nausea and vomiting.  Genitourinary: Negative for dysuria and vaginal bleeding.  Musculoskeletal: Positive for back pain.   Other systems negative  Physical Exam   Patient Vitals for the past 24 hrs:  BP Temp Temp src Pulse Resp Height Weight  12/29/18 2149 132/87 98.7 F (37.1 C) Oral 74 20 5' (1.524 m) 64.4 kg   Constitutional: Well-developed, well-nourished female in no acute distress.  Cardiovascular: normal rate and rhythm Respiratory: normal effort, clear to auscultation bilaterally GI: Abd soft, non-tender, gravid  appropriate for gestational age.   No rebound or guarding. MS: Extremities nontender, no edema, normal ROM Neurologic: Alert and oriented x 4.  GU: Neg CVAT.  PELVIC EXAM:  Dilation: Fingertip Effacement (%): 80 Cervical Position: Posterior Station: -2 Presentation: Undeterminable Exam by:: Avery DennisonBenji Stanley RN  FHT:  Baseline 140 , moderate variability, accelerations present, no decelerations Contractions: q 5-10 mins Irregular    Labs: B/Positive/-- (02/04 1030) Results for orders placed or performed during the hospital encounter of 12/29/18 (from the past 24 hour(s))  POCT fern test     Status: None   Collection Time: 12/29/18 10:19 PM  Result Value Ref Range   POCT Fern Test Negative = intact amniotic membranes   Urinalysis, Routine w reflex microscopic     Status: Abnormal   Collection Time: 12/29/18 10:20 PM  Result Value Ref Range   Color, Urine YELLOW YELLOW   APPearance HAZY (A) CLEAR   Specific Gravity, Urine 1.020 1.005 - 1.030   pH 6.0 5.0 - 8.0   Glucose, UA NEGATIVE NEGATIVE mg/dL   Hgb urine dipstick NEGATIVE NEGATIVE   Bilirubin Urine NEGATIVE NEGATIVE   Ketones, ur 80 (A) NEGATIVE mg/dL   Protein, ur 30 (A) NEGATIVE mg/dL   Nitrite NEGATIVE NEGATIVE   Leukocytes,Ua MODERATE (A) NEGATIVE   RBC / HPF 0-5 0 - 5 RBC/hpf   WBC, UA 11-20 0 - 5 WBC/hpf   Bacteria, UA FEW (A) NONE SEEN   Squamous Epithelial / LPF 11-20 0 - 5   Mucus PRESENT   CBC     Status: Abnormal   Collection Time: 12/29/18 10:24 PM  Result Value Ref Range   WBC 9.5 4.0 - 10.5 K/uL   RBC 3.65 (L) 3.87 - 5.11 MIL/uL   Hemoglobin 11.2 (L) 12.0 - 15.0 g/dL   HCT 19.133.7 (L) 47.836.0 - 29.546.0 %   MCV 92.3 80.0 - 100.0 fL   MCH 30.7 26.0 - 34.0 pg   MCHC 33.2 30.0 - 36.0 g/dL   RDW 62.112.8 30.811.5 - 65.715.5 %   Platelets 214 150 - 400 K/uL   nRBC 0.0 0.0 - 0.2 %    Imaging:  [place] Renal  Result Date: 12/29/2018 CLINICAL DATA:  Initial evaluation for acute right lower quadrant pain, currently [redacted] weeks  pregnant. EXAM: RENAL / URINARY TRACT ULTRASOUND COMPLETE COMPARISON:  None. FINDINGS: Right Kidney: Renal measurements: 10.5 x 4.9 x 4.9 cm = volume: 132.9 mL . Echogenicity within normal limits. No mass lesion. Moderate right-sided hydronephrosis. No shadowing echogenic calculi to suggest nephrolithiasis. Left Kidney: Renal measurements: 10.82 x 4.1 x 4.5 cm = volume: 99.0 mL. Echogenicity within normal limits. No mass or hydronephrosis visualized. Bladder: Not well seen due to overlying gravid uterus and incomplete distension. IMPRESSION: 1. Moderate right-sided hydronephrosis, which could be related to current  pregnancy. No sonographic evidence for nephrolithiasis. 2. Normal sonographic evaluation of the left kidney. Electronically Signed   By: Rise MuBenjamin  McClintock M.D.   On: 12/29/2018 23:20     MAU Course/MDM: I have ordered labs and reviewed results.  There is no leukocytosis to suggest infection like appendicitis.  We cultured urine two days ago with no growth, so will not reculture.  Renal US ordered to rule out stones as a source of RLQ pain, This is negative  Discussed this is likely due to fetal position or round ligament strain.  Discussed warm baths, belly down positioning, and will Rx a few Percocet for pain and sleep.  NST reviewed.    Assessment: 1. Preterm uterine contractions   2. Encounter for supervision of low-risk first pregnancy, antepartum   3. RLQ abdominal pain     Plan: Discharge home Labor precautions and fetal kick counts Follow up in Office for prenatal visits and recheck Conservative care discussed Encouraged to return here or to other Urgent Care/ED if she develops worsening of symptoms, increase in pain, fever, or other concerning symptoms.   Pt stable at time of discharge.  Wynelle BourgeoisMarie Zayne Marovich CNM, MSN Certified Nurse-Midwife 12/29/2018 10:23 PM

## 2018-12-29 NOTE — Progress Notes (Signed)
   PRENATAL VISIT NOTE  Subjective:  Elizabeth Shaffer is a 23 y.o. G1P0000 at [redacted]w[redacted]d being seen today for ongoing prenatal care.  She is currently monitored for the following issues for this low-risk pregnancy and has Supervision of low-risk first pregnancy; GERD (gastroesophageal reflux disease); Anemia affecting pregnancy in third trimester; and Preterm uterine contractions on their problem list.  Patient reports backache.  Contractions: Irritability. Vag. Bleeding: None.  Movement: Present. Denies leaking of fluid.  Patient was in MAU a few days ago with right sided back pain and abdominal pain. False labor. Patient states pain is somewhat well controlled with Flexeril. She denies any worsening of the pain or increase in contractions.   The following portions of the patient's history were reviewed and updated as appropriate: allergies, current medications, past family history, past medical history, past social history, past surgical history and problem list.   Objective:   Vitals:   12/29/18 1114  BP: 109/72  Pulse: 95  Temp: 98.4 F (36.9 C)  Weight: 142 lb 12.8 oz (64.8 kg)    Fetal Status: Fetal Heart Rate (bpm): 153 Fundal Height: 37 cm Movement: Present     General:  Alert, oriented and cooperative. Patient is in no acute distress.  Skin: Skin is warm and dry. No rash noted.   Cardiovascular: Normal heart rate noted  Respiratory: Normal respiratory effort, no problems with respiration noted  Abdomen: Soft, gravid, appropriate for gestational age.  Pain/Pressure: Present     Pelvic: Cervical exam deferred       Patient declines  Extremities: Normal range of motion.  Edema: Trace  Mental Status: Normal mood and affect. Normal behavior. Normal judgment and thought content.   Assessment and Plan:  Pregnancy: G1P0000 at [redacted]w[redacted]d 1. Encounter for supervision of low-risk first pregnancy, antepartum - Doing well  - Flexeril is helping with back pain - Discussed heat and hydrotherapy for  pain as well  - Advised that abdominal binder may help   Term labor symptoms and general obstetric precautions including but not limited to vaginal bleeding, contractions, leaking of fluid and fetal movement were reviewed in detail with the patient. Please refer to After Visit Summary for other counseling recommendations.   Return in about 11 days (around 01/09/2019) for Virtual, LOB.  Future Appointments  Date Time Provider [place]  01/09/2019  2:15 PM Tresea Mall, CNM [facility]    Kerry Hough, PA-C

## 2018-12-29 NOTE — Patient Instructions (Signed)
Fetal Movement Counts Patient Name: ________________________________________________ Patient Due Date: ____________________ What is a fetal movement count?  A fetal movement count is the number of times that you feel your baby move during a certain amount of time. This may also be called a fetal kick count. A fetal movement count is recommended for every pregnant woman. You may be asked to start counting fetal movements as early as week 28 of your pregnancy. Pay attention to when your baby is most active. You may notice your baby's sleep and wake cycles. You may also notice things that make your baby move more. You should do a fetal movement count:  When your baby is normally most active.  At the same time each day. A good time to count movements is while you are resting, after having something to eat and drink. How do I count fetal movements? 1. Find a quiet, comfortable area. Sit, or lie down on your side. 2. Write down the date, the start time and stop time, and the number of movements that you felt between those two times. Take this information with you to your health care visits. 3. For 2 hours, count kicks, flutters, swishes, rolls, and jabs. You should feel at least 10 movements during 2 hours. 4. You may stop counting after you have felt 10 movements. 5. If you do not feel 10 movements in 2 hours, have something to eat and drink. Then, keep resting and counting for 1 hour. If you feel at least 4 movements during that hour, you may stop counting. Contact a health care provider if:  You feel fewer than 4 movements in 2 hours.  Your baby is not moving like he or she usually does. Date: ____________ Start time: ____________ Stop time: ____________ Movements: ____________ Date: ____________ Start time: ____________ Stop time: ____________ Movements: ____________ Date: ____________ Start time: ____________ Stop time: ____________ Movements: ____________ Date: ____________ Start time:  ____________ Stop time: ____________ Movements: ____________ Date: ____________ Start time: ____________ Stop time: ____________ Movements: ____________ Date: ____________ Start time: ____________ Stop time: ____________ Movements: ____________ Date: ____________ Start time: ____________ Stop time: ____________ Movements: ____________ Date: ____________ Start time: ____________ Stop time: ____________ Movements: ____________ Date: ____________ Start time: ____________ Stop time: ____________ Movements: ____________ This information is not intended to replace advice given to you by your health care provider. Make sure you discuss any questions you have with your health care provider. Document Released: 07/01/2006 Document Revised: 06/21/2018 Document Reviewed: 07/11/2015 Elsevier Patient Education  2020 [employer] Braxton Hicks Contractions Contractions of the uterus can occur throughout pregnancy, but they are not always a sign that you are in labor. You may have practice contractions called Braxton Hicks contractions. These false labor contractions are sometimes confused with true labor. What are Braxton Hicks contractions? Braxton Hicks contractions are tightening movements that occur in the muscles of the uterus before labor. Unlike true labor contractions, these contractions do not result in opening (dilation) and thinning of the cervix. Toward the end of pregnancy (32-34 weeks), Braxton Hicks contractions can happen more often and may become stronger. These contractions are sometimes difficult to tell apart from true labor because they can be very uncomfortable. You should not feel embarrassed if you go to the hospital with false labor. Sometimes, the only way to tell if you are in true labor is for your health care provider to look for changes in the cervix. The health care provider will do a physical exam and may monitor your contractions. If you   are not in true labor, the exam should show  that your cervix is not dilating and your water has not broken. If there are no other health problems associated with your pregnancy, it is completely safe for you to be sent home with false labor. You may continue to have Braxton Hicks contractions until you go into true labor. How to tell the difference between true labor and false labor True labor  Contractions last 30-70 seconds.  Contractions become very regular.  Discomfort is usually felt in the top of the uterus, and it spreads to the lower abdomen and low back.  Contractions do not go away with walking.  Contractions usually become more intense and increase in frequency.  The cervix dilates and gets thinner. False labor  Contractions are usually shorter and not as strong as true labor contractions.  Contractions are usually irregular.  Contractions are often felt in the front of the lower abdomen and in the groin.  Contractions may go away when you walk around or change positions while lying down.  Contractions get weaker and are shorter-lasting as time goes on.  The cervix usually does not dilate or become thin. Follow these instructions at home:   Take over-the-counter and prescription medicines only as told by your health care provider.  Keep up with your usual exercises and follow other instructions from your health care provider.  Eat and drink lightly if you think you are going into labor.  If Braxton Hicks contractions are making you uncomfortable: ? Change your position from lying down or resting to walking, or change from walking to resting. ? Sit and rest in a tub of warm water. ? Drink enough fluid to keep your urine pale yellow. Dehydration may cause these contractions. ? Do slow and deep breathing several times an hour.  Keep all follow-up prenatal visits as told by your health care provider. This is important. Contact a health care provider if:  You have a fever.  You have continuous pain in  your abdomen. Get help right away if:  Your contractions become stronger, more regular, and closer together.  You have fluid leaking or gushing from your vagina.  You pass blood-tinged mucus (bloody show).  You have bleeding from your vagina.  You have low back pain that you never had before.  You feel your baby's head pushing down and causing pelvic pressure.  Your baby is not moving inside you as much as it used to. Summary  Contractions that occur before labor are called Braxton Hicks contractions, false labor, or practice contractions.  Braxton Hicks contractions are usually shorter, weaker, farther apart, and less regular than true labor contractions. True labor contractions usually become progressively stronger and regular, and they become more frequent.  Manage discomfort from Braxton Hicks contractions by changing position, resting in a warm bath, drinking plenty of water, or practicing deep breathing. This information is not intended to replace advice given to you by your health care provider. Make sure you discuss any questions you have with your health care provider. Document Released: 10/15/2016 Document Revised: 05/14/2017 Document Reviewed: 10/15/2016 Elsevier Patient Education  2020 [employer]  

## 2018-12-29 NOTE — MAU Note (Signed)
Since last Friday, really sharp pain in low abd, was coming and going and now its been constant x 2-3 days.  At 9:30 pm, felt something wet in underwear.  No bleeding. Baby moving well.  This pain is different then the contractions.  Unbearable for a week.  Taking warm baths, tried Flexeril, Tylenol, nothing is helping.

## 2018-12-30 DIAGNOSIS — O26893 Other specified pregnancy related conditions, third trimester: Secondary | ICD-10-CM

## 2018-12-30 DIAGNOSIS — Z3A37 37 weeks gestation of pregnancy: Secondary | ICD-10-CM

## 2018-12-30 DIAGNOSIS — O479 False labor, unspecified: Secondary | ICD-10-CM

## 2018-12-30 DIAGNOSIS — R1031 Right lower quadrant pain: Secondary | ICD-10-CM

## 2018-12-30 MED ORDER — OXYCODONE HCL 5 MG PO TABS: 5.0000 mg | ORAL_TABLET | ORAL | 0 refills | Status: DC | PRN

## 2018-12-30 NOTE — Discharge Instructions (Signed)
Labor/Vaginal Delivery  Vaginal delivery means that you give birth by pushing your baby out of your birth canal (vagina). A team of health care providers will help you before, during, and after vaginal delivery. Birth experiences are unique for every woman and every pregnancy, and birth experiences vary depending on where you choose to give birth. What happens when I arrive at the birth center or hospital? Once you are in labor and have been admitted into the hospital or birth center, your health care provider may:  Review your pregnancy history and any concerns that you have.  Insert an IV into one of your veins. This may be used to give you fluids and medicines.  Check your blood pressure, pulse, temperature, and heart rate (vital signs).  Check whether your bag of water (amniotic sac) has broken (ruptured).  Talk with you about your birth plan and discuss pain control options. Monitoring Your health care provider may monitor your contractions (uterine monitoring) and your baby's heart rate (fetal monitoring). You may need to be monitored:  Often, but not continuously (intermittently).  All the time or for long periods at a time (continuously). Continuous monitoring may be needed if: ? You are taking certain medicines, such as medicine to relieve pain or make your contractions stronger. ? You have pregnancy or labor complications. Monitoring may be done by:  Placing a special stethoscope or a handheld monitoring device on your abdomen to check your baby's heartbeat and to check for contractions.  Placing monitors on your abdomen (external monitors) to record your baby's heartbeat and the frequency and length of contractions.  Placing monitors inside your uterus through your vagina (internal monitors) to record your baby's heartbeat and the frequency, length, and strength of your contractions. Depending on the type of monitor, it may remain in your uterus or on your baby's head until  birth.  Telemetry. This is a type of continuous monitoring that can be done with external or internal monitors. Instead of having to stay in bed, you are able to move around during telemetry. Physical exam Your health care provider may perform frequent physical exams. This may include:  Checking how and where your baby is positioned in your uterus.  Checking your cervix to determine: ? Whether it is thinning out (effacing). ? Whether it is opening up (dilating). What happens during labor and delivery?  Normal labor and delivery is divided into the following three stages: Stage 1  This is the longest stage of labor.  This stage can last for hours or days.  Throughout this stage, you will feel contractions. Contractions generally feel mild, infrequent, and irregular at first. They get stronger, more frequent (about every 2-3 minutes), and more regular as you move through this stage.  This stage ends when your cervix is completely dilated to 4 inches (10 cm) and completely effaced. Stage 2  This stage starts once your cervix is completely effaced and dilated and lasts until the delivery of your baby.  This stage may last from 20 minutes to 2 hours.  This is the stage where you will feel an urge to push your baby out of your vagina.  You may feel stretching and burning pain, especially when the widest part of your baby's head passes through the vaginal opening (crowning).  Once your baby is delivered, the umbilical cord will be clamped and cut. This usually occurs after waiting a period of 1-2 minutes after delivery.  Your baby will be placed on your bare chest (  skin-to-skin contact) in an upright position and covered with a warm blanket. Watch your baby for feeding cues, like rooting or sucking, and help the baby to your breast for his or her first feeding. Stage 3  This stage starts immediately after the birth of your baby and ends after you deliver the placenta.  This stage may take  anywhere from 5 to 30 minutes.  After your baby has been delivered, you will feel contractions as your body expels the placenta and your uterus contracts to control bleeding. What can I expect after labor and delivery?  After labor is over, you and your baby will be monitored closely until you are ready to go home to ensure that you are both healthy. Your health care team will teach you how to care for yourself and your baby.  You and your baby will stay in the same room (rooming in) during your hospital stay. This will encourage early bonding and successful breastfeeding.  You may continue to receive fluids and medicines through an IV.  Your uterus will be checked and massaged regularly (fundal massage).  You will have some soreness and pain in your abdomen, vagina, and the area of skin between your vaginal opening and your anus (perineum).  If an incision was made near your vagina (episiotomy) or if you had some vaginal tearing during delivery, cold compresses may be placed on your episiotomy or your tear. This helps to reduce pain and swelling.  You may be given a squirt bottle to use instead of wiping when you go to the bathroom. To use the squirt bottle, follow these steps: ? Before you urinate, fill the squirt bottle with warm water. Do not use hot water. ? After you urinate, while you are sitting on the toilet, use the squirt bottle to rinse the area around your urethra and vaginal opening. This rinses away any urine and blood. ? Fill the squirt bottle with clean water every time you use the bathroom.  It is normal to have vaginal bleeding after delivery. Wear a sanitary pad for vaginal bleeding and discharge. Summary  Vaginal delivery means that you will give birth by pushing your baby out of your birth canal (vagina).  Your health care provider may monitor your contractions (uterine monitoring) and your baby's heart rate (fetal monitoring).  Your health care provider may perform  a physical exam.  Normal labor and delivery is divided into three stages.  After labor is over, you and your baby will be monitored closely until you are ready to go home. This information is not intended to replace advice given to you by your health care provider. Make sure you discuss any questions you have with your health care provider. Document Released: 03/10/2008 Document Revised: 07/06/2017 Document Reviewed: 07/06/2017 Elsevier Patient Education  2020 [employer]  

## 2018-12-30 NOTE — MAU Note (Signed)
I have communicated with Hansel Feinstein CNM and reviewed vital signs:  Vitals:   12/29/18 2250 12/30/18 0001  BP:  113/68  Pulse:  73  Resp:  16  Temp:  98.3 F (36.8 C)  SpO2: 100%     Vaginal exam:  Dilation: Fingertip Effacement (%): 80 Cervical Position: Posterior Station: -2 Presentation: Undeterminable Exam by:: Alycia Rossetti RN,   Also reviewed contraction pattern and that non-stress test is reactive.  It has been documented that patient is contracting occasionally by [facility] but pt denies any contractions at discharge and verbalized that she prefers not to have a repeat cervical exam. Based on this report provider has given order for discharge.  A discharge order and diagnosis entered by a provider.  Labor discharge instructions reviewed with patient.

## 2019-01-04 ENCOUNTER — Encounter (HOSPITAL_COMMUNITY): Payer: Self-pay

## 2019-01-04 ENCOUNTER — Inpatient Hospital Stay (HOSPITAL_COMMUNITY)
Admission: AD | Admit: 2019-01-04 | Discharge: 2019-01-04 | Disposition: A | Discharge status: Home / Self Care | Payer: Medicaid Other | Attending: Obstetrics and Gynecology | Admitting: Obstetrics and Gynecology

## 2019-01-04 ENCOUNTER — Other Ambulatory Visit: Payer: Self-pay

## 2019-01-04 DIAGNOSIS — Z3A38 38 weeks gestation of pregnancy: Secondary | ICD-10-CM | POA: Diagnosis not present

## 2019-01-04 DIAGNOSIS — O479 False labor, unspecified: Secondary | ICD-10-CM

## 2019-01-04 DIAGNOSIS — O471 False labor at or after 37 completed weeks of gestation: Secondary | ICD-10-CM | POA: Diagnosis present

## 2019-01-04 NOTE — MAU Provider Note (Signed)
S: Patient is here for RN labor evaluation. Strip, vital signs, & chart Reviewed   O:  Vitals:   01/04/19 2021 01/04/19 2022 01/04/19 2038  BP: 125/79  132/89  Pulse: (!) 108  98  Resp: 16    Temp: 98.1 F (36.7 C)    TempSrc: Oral    SpO2: 99%    Weight:  63.1 kg   Height:  5' (1.524 m)    No results found for this or any previous visit (from the past 24 hour(s)).  Dilation: 1 Effacement (%): 80 Cervical Position: Posterior Station: -2 Presentation: Vertex Exam by:: Gilmer Mor RN   FHR: 150 bpm, Mod Var, no Decels, 15x15 Accels UC: irregular   A: 1. False labor   2. [redacted] weeks gestation of pregnancy      P:  RN to discharge home in stable condition with return precautions & fetal kick counts  Jorje Guild FNP 9:01 PM

## 2019-01-04 NOTE — Discharge Instructions (Signed)
Fetal Movement Counts Patient Name: ________________________________________________ Patient Due Date: ____________________ What is a fetal movement count?  A fetal movement count is the number of times that you feel your baby move during a certain amount of time. This may also be called a fetal kick count. A fetal movement count is recommended for every pregnant woman. You may be asked to start counting fetal movements as early as week 28 of your pregnancy. Pay attention to when your baby is most active. You may notice your baby's sleep and wake cycles. You may also notice things that make your baby move more. You should do a fetal movement count:  When your baby is normally most active.  At the same time each day. A good time to count movements is while you are resting, after having something to eat and drink. How do I count fetal movements? 1. Find a quiet, comfortable area. Sit, or lie down on your side. 2. Write down the date, the start time and stop time, and the number of movements that you felt between those two times. Take this information with you to your health care visits. 3. For 2 hours, count kicks, flutters, swishes, rolls, and jabs. You should feel at least 10 movements during 2 hours. 4. You may stop counting after you have felt 10 movements. 5. If you do not feel 10 movements in 2 hours, have something to eat and drink. Then, keep resting and counting for 1 hour. If you feel at least 4 movements during that hour, you may stop counting. Contact a health care provider if:  You feel fewer than 4 movements in 2 hours.  Your baby is not moving like he or she usually does. Date: ____________ Start time: ____________ Stop time: ____________ Movements: ____________ Date: ____________ Start time: ____________ Stop time: ____________ Movements: ____________ Date: ____________ Start time: ____________ Stop time: ____________ Movements: ____________ Date: ____________ Start time:  ____________ Stop time: ____________ Movements: ____________ Date: ____________ Start time: ____________ Stop time: ____________ Movements: ____________ Date: ____________ Start time: ____________ Stop time: ____________ Movements: ____________ Date: ____________ Start time: ____________ Stop time: ____________ Movements: ____________ Date: ____________ Start time: ____________ Stop time: ____________ Movements: ____________ Date: ____________ Start time: ____________ Stop time: ____________ Movements: ____________ This information is not intended to replace advice given to you by your health care provider. Make sure you discuss any questions you have with your health care provider. Document Released: 07/01/2006 Document Revised: 06/21/2018 Document Reviewed: 07/11/2015 Elsevier Patient Education  2020 [employer] Signs and Symptoms of Labor Labor is your body's natural process of moving your baby, placenta, and umbilical cord out of your uterus. The process of labor usually starts when your baby is full-term, between 37 and 40 weeks of pregnancy. How will I know when I am close to going into labor? As your body prepares for labor and the birth of your baby, you may notice the following symptoms in the weeks and days before true labor starts:  Having a strong desire to get your home ready to receive your new baby. This is called nesting. Nesting may be a sign that labor is approaching, and it may occur several weeks before birth. Nesting may involve cleaning and organizing your home.  Passing a small amount of thick, bloody mucus out of your vagina (normal bloody show or losing your mucus plug). This may happen more than a week before labor begins, or it might occur right before labor begins as the opening of the cervix starts   to widen (dilate). For some women, the entire mucus plug passes at once. For others, smaller portions of the mucus plug may gradually pass over several days.  Your baby  moving (dropping) lower in your pelvis to get into position for birth (lightening). When this happens, you may feel more pressure on your bladder and pelvic bone and less pressure on your ribs. This may make it easier to breathe. It may also cause you to need to urinate more often and have problems with bowel movements.  Having "practice contractions" (Braxton Hicks contractions) that occur at irregular (unevenly spaced) intervals that are more than 10 minutes apart. This is also called false labor. False labor contractions are common after exercise or sexual activity, and they will stop if you change position, rest, or drink fluids. These contractions are usually mild and do not get stronger over time. They may feel like: ? A backache or back pain. ? Mild cramps, similar to menstrual cramps. ? Tightening or pressure in your abdomen. Other early symptoms that labor may be starting soon include:  Nausea or loss of appetite.  Diarrhea.  Having a sudden burst of energy, or feeling very tired.  Mood changes.  Having trouble sleeping. How will I know when labor has begun? Signs that true labor has begun may include:  Having contractions that come at regular (evenly spaced) intervals and increase in intensity. This may feel like more intense tightening or pressure in your abdomen that moves to your back. ? Contractions may also feel like rhythmic pain in your upper thighs or back that comes and goes at regular intervals. ? For first-time mothers, this change in intensity of contractions often occurs at a more gradual pace. ? Women who have given birth before may notice a more rapid progression of contraction changes.  Having a feeling of pressure in the vaginal area.  Your water breaking (rupture of membranes). This is when the sac of fluid that surrounds your baby breaks. When this happens, you will notice fluid leaking from your vagina. This may be clear or blood-tinged. Labor usually starts  within 24 hours of your water breaking, but it may take longer to begin. ? Some women notice this as a gush of fluid. ? Others notice that their underwear repeatedly becomes damp. Follow these instructions at home:   When labor starts, or if your water breaks, call your health care provider or nurse care line. Based on your situation, they will determine when you should go in for an exam.  When you are in early labor, you may be able to rest and manage symptoms at home. Some strategies to try at home include: ? Breathing and relaxation techniques. ? Taking a warm bath or shower. ? Listening to music. ? Using a heating pad on the lower back for pain. If you are directed to use heat:  Place a towel between your skin and the heat source.  Leave the heat on for 20-30 minutes.  Remove the heat if your skin turns bright red. This is especially important if you are unable to feel pain, heat, or cold. You may have a greater risk of getting burned. Get help right away if:  You have painful, regular contractions that are 5 minutes apart or less.  Labor starts before you are [redacted] weeks along in your pregnancy.  You have a fever.  You have a headache that does not go away.  You have bright red blood coming from your vagina.  You   do not feel your baby moving.  You have a sudden onset of: ? Severe headache with vision problems. ? Nausea, vomiting, or diarrhea. ? Chest pain or shortness of breath. These symptoms may be an emergency. If your health care provider recommends that you go to the hospital or birth center where you plan to deliver, do not drive yourself. Have someone else drive you, or call emergency services (911 in the [place] Summary  Labor is your body's natural process of moving your baby, placenta, and umbilical cord out of your uterus.  The process of labor usually starts when your baby is full-term, between 37 and 40 weeks of pregnancy.  When labor starts, or if your water  breaks, call your health care provider or nurse care line. Based on your situation, they will determine when you should go in for an exam. This information is not intended to replace advice given to you by your health care provider. Make sure you discuss any questions you have with your health care provider. Document Released: 11/06/2016 Document Revised: 03/01/2017 Document Reviewed: 11/06/2016 Elsevier Patient Education  2020 [employer] Inc.  

## 2019-01-04 NOTE — MAU Note (Signed)
Presents with cxts in lower abd; radiating to lower back. Pain 10/10. Pt describes pain as sharp, shooting pain. No LOF or vag bldg. Pos FM. No pain meds taken today.    Gilmer Mor RN

## 2019-01-09 ENCOUNTER — Inpatient Hospital Stay (HOSPITAL_COMMUNITY)
Admission: AC | Admit: 2019-01-09 | Discharge: 2019-01-11 | DRG: 807 | Disposition: A | Discharge status: Home / Self Care | Payer: Medicaid Other | Attending: Obstetrics and Gynecology | Admitting: Obstetrics and Gynecology

## 2019-01-09 ENCOUNTER — Telehealth: Payer: Medicaid Other | Admitting: Advanced Practice Midwife

## 2019-01-09 ENCOUNTER — Inpatient Hospital Stay (HOSPITAL_COMMUNITY): Payer: Medicaid Other | Admitting: Anesthesiology

## 2019-01-09 ENCOUNTER — Encounter (HOSPITAL_COMMUNITY): Payer: Self-pay

## 2019-01-09 ENCOUNTER — Other Ambulatory Visit: Payer: Self-pay

## 2019-01-09 DIAGNOSIS — Z3A39 39 weeks gestation of pregnancy: Secondary | ICD-10-CM | POA: Diagnosis not present

## 2019-01-09 DIAGNOSIS — Z3400 Encounter for supervision of normal first pregnancy, unspecified trimester: Secondary | ICD-10-CM

## 2019-01-09 DIAGNOSIS — O479 False labor, unspecified: Secondary | ICD-10-CM

## 2019-01-09 DIAGNOSIS — O4292 Full-term premature rupture of membranes, unspecified as to length of time between rupture and onset of labor: Secondary | ICD-10-CM | POA: Diagnosis present

## 2019-01-09 DIAGNOSIS — O9081 Anemia of the puerperium: Secondary | ICD-10-CM | POA: Diagnosis not present

## 2019-01-09 DIAGNOSIS — Z8759 Personal history of other complications of pregnancy, childbirth and the puerperium: Secondary | ICD-10-CM

## 2019-01-09 DIAGNOSIS — O26893 Other specified pregnancy related conditions, third trimester: Secondary | ICD-10-CM | POA: Diagnosis present

## 2019-01-09 DIAGNOSIS — O134 Gestational [pregnancy-induced] hypertension without significant proteinuria, complicating childbirth: Secondary | ICD-10-CM | POA: Diagnosis present

## 2019-01-09 DIAGNOSIS — O4700 False labor before 37 completed weeks of gestation, unspecified trimester: Secondary | ICD-10-CM

## 2019-01-09 DIAGNOSIS — Z1159 Encounter for screening for other viral diseases: Secondary | ICD-10-CM

## 2019-01-09 DIAGNOSIS — O4290 Premature rupture of membranes, unspecified as to length of time between rupture and onset of labor, unspecified weeks of gestation: Secondary | ICD-10-CM | POA: Diagnosis present

## 2019-01-09 LAB — COMPREHENSIVE METABOLIC PANEL
ALT: 10 U/L (ref 0–44)
AST: 16 U/L (ref 15–41)
Albumin: 2.7 g/dL — ABNORMAL LOW (ref 3.5–5.0)
Alkaline Phosphatase: 168 U/L — ABNORMAL HIGH (ref 38–126)
Anion gap: 12 (ref 5–15)
BUN: <5 mg/dL — ABNORMAL LOW (ref 6–20)
CO2: 21 mmol/L — ABNORMAL LOW (ref 22–32)
Calcium: 9.1 mg/dL (ref 8.9–10.3)
Chloride: 105 mmol/L (ref 98–111)
Creatinine, Ser: 0.72 mg/dL (ref 0.44–1.00)
GFR calc Af Amer: >60 mL/min (ref >60)
GFR calc non Af Amer: >60 mL/min (ref >60)
Glucose, Bld: 83 mg/dL (ref 70–99)
Potassium: 3.6 mmol/L (ref 3.5–5.1)
Sodium: 138 mmol/L (ref 135–145)
Total Bilirubin: 0.7 mg/dL (ref 0.3–1.2)
Total Protein: 6.8 g/dL (ref 6.5–8.1)

## 2019-01-09 LAB — CBC
HCT: 35.7 % — ABNORMAL LOW (ref 36.0–46.0)
Hemoglobin: 11.4 g/dL — ABNORMAL LOW (ref 12.0–15.0)
MCH: 30.9 pg (ref 26.0–34.0)
MCHC: 31.9 g/dL (ref 30.0–36.0)
MCV: 96.7 fL (ref 80.0–100.0)
Platelets: 300 10*3/uL (ref 150–400)
RBC: 3.69 MIL/uL — ABNORMAL LOW (ref 3.87–5.11)
RDW: 13.5 % (ref 11.5–15.5)
WBC: 7.9 10*3/uL (ref 4.0–10.5)
nRBC: 0 % (ref 0.0–0.2)

## 2019-01-09 LAB — TYPE AND SCREEN
ABO/RH(D): B POS
Antibody Screen: NEGATIVE

## 2019-01-09 LAB — PROTEIN / CREATININE RATIO, URINE
Creatinine, Urine: 134.36 mg/dL
Protein Creatinine Ratio: 0.14 mg/mg{Cre} (ref 0.00–0.15)
Total Protein, Urine: 19 mg/dL

## 2019-01-09 LAB — SARS CORONAVIRUS 2 BY RT PCR (HOSPITAL ORDER, PERFORMED IN ~~LOC~~ HOSPITAL LAB): SARS Coronavirus 2: NEGATIVE

## 2019-01-09 LAB — POCT FERN TEST: POCT Fern Test: POSITIVE

## 2019-01-09 MED ORDER — PHENYLEPHRINE 40 MCG/ML (10ML) SYRINGE FOR IV PUSH (FOR BLOOD PRESSURE SUPPORT): 80.0000 ug | PREFILLED_SYRINGE | INTRAVENOUS | Status: DC | PRN

## 2019-01-09 MED ORDER — EPHEDRINE 5 MG/ML INJ: 10.0000 mg | INTRAVENOUS | Status: DC | PRN

## 2019-01-09 MED ORDER — LIDOCAINE HCL (PF) 1 % IJ SOLN: 30.0000 mL | INTRAMUSCULAR | Status: DC | PRN

## 2019-01-09 MED ORDER — FENTANYL-BUPIVACAINE-NACL 0.5-0.125-0.9 MG/250ML-% EP SOLN: 12.0000 mL/h | EPIDURAL | Status: DC | PRN

## 2019-01-09 MED ORDER — TERBUTALINE SULFATE 1 MG/ML IJ SOLN: 0.2500 mg | Freq: Once | INTRAMUSCULAR | Status: DC | PRN

## 2019-01-09 MED ORDER — FENTANYL-BUPIVACAINE-NACL 0.5-0.125-0.9 MG/250ML-% EP SOLN
12.0000 mL/h | EPIDURAL | Status: DC | PRN
  Filled 2019-01-09: qty 250

## 2019-01-09 MED ORDER — LACTATED RINGERS IV SOLN
500.0000 mL | INTRAVENOUS | Status: DC | PRN
  Administered 2019-01-09: 1000 mL via INTRAVENOUS

## 2019-01-09 MED ORDER — ONDANSETRON HCL 4 MG/2ML IJ SOLN: 4.0000 mg | Freq: Four times a day (QID) | INTRAMUSCULAR | Status: DC | PRN

## 2019-01-09 MED ORDER — SODIUM CHLORIDE (PF) 0.9 % IJ SOLN
INTRAMUSCULAR | Status: DC | PRN
  Administered 2019-01-09: 12 mL/h via EPIDURAL

## 2019-01-09 MED ORDER — DIPHENHYDRAMINE HCL 50 MG/ML IJ SOLN: 12.5000 mg | INTRAMUSCULAR | Status: DC | PRN

## 2019-01-09 MED ORDER — OXYTOCIN BOLUS FROM INFUSION
500.0000 mL | Freq: Once | INTRAVENOUS | Status: AC
  Administered 2019-01-09: 500 mL via INTRAVENOUS

## 2019-01-09 MED ORDER — OXYTOCIN 40 UNITS IN NORMAL SALINE INFUSION - SIMPLE MED
2.5000 [IU]/h | INTRAVENOUS | Status: DC
  Administered 2019-01-10: 2.5 [IU]/h via INTRAVENOUS
  Filled 2019-01-09: qty 1000

## 2019-01-09 MED ORDER — LACTATED RINGERS IV SOLN
INTRAVENOUS | Status: DC
  Administered 2019-01-09: 09:00:00 via INTRAVENOUS

## 2019-01-09 MED ORDER — LIDOCAINE HCL (PF) 1 % IJ SOLN
INTRAMUSCULAR | Status: DC | PRN
  Administered 2019-01-09: 5 mL via EPIDURAL

## 2019-01-09 MED ORDER — OXYTOCIN 40 UNITS IN NORMAL SALINE INFUSION - SIMPLE MED
1.0000 m[IU]/min | INTRAVENOUS | Status: DC
  Administered 2019-01-09: 2 m[IU]/min via INTRAVENOUS

## 2019-01-09 MED ORDER — FLEET ENEMA 7-19 GM/118ML RE ENEM: 1.0000 | ENEMA | RECTAL | Status: DC | PRN

## 2019-01-09 MED ORDER — SOD CITRATE-CITRIC ACID 500-334 MG/5ML PO SOLN
30.0000 mL | ORAL | Status: DC | PRN
  Filled 2019-01-09: qty 30

## 2019-01-09 MED ORDER — MISOPROSTOL 50MCG HALF TABLET: 50.0000 ug | ORAL_TABLET | ORAL | Status: DC | PRN

## 2019-01-09 MED ORDER — LACTATED RINGERS IV SOLN: 500.0000 mL | Freq: Once | INTRAVENOUS | Status: DC

## 2019-01-09 MED ORDER — FENTANYL CITRATE (PF) 100 MCG/2ML IJ SOLN
100.0000 ug | INTRAMUSCULAR | Status: DC | PRN
  Administered 2019-01-09: 100 ug via INTRAVENOUS
  Filled 2019-01-09: qty 2

## 2019-01-09 NOTE — Progress Notes (Signed)
IUPC placed by Maryelizabeth Kaufmann per Maida Sale, RN.

## 2019-01-09 NOTE — Anesthesia Preprocedure Evaluation (Signed)
Anesthesia Evaluation  Patient identified by MRN, date of birth, ID band Patient awake    Reviewed: Allergy & Precautions, Patient's Chart, lab work & pertinent test results  Airway Mallampati: I       Dental no notable dental hx.    Pulmonary    Pulmonary exam normal        Cardiovascular negative cardio ROS Normal cardiovascular exam     Neuro/Psych    GI/Hepatic Neg liver ROS, GERD  ,  Endo/Other    Renal/GU      Musculoskeletal   Abdominal   Peds  Hematology  (+) anemia ,   Anesthesia Other Findings   Reproductive/Obstetrics (+) Pregnancy                             Anesthesia Physical Anesthesia Plan  ASA: II  Anesthesia Plan: Epidural   Post-op Pain Management:    Induction:   PONV Risk Score and Plan:   Airway Management Planned:   Additional Equipment: None  Intra-op Plan:   Post-operative Plan:   Informed Consent: I have reviewed the patients History and Physical, chart, labs and discussed the procedure including the risks, benefits and alternatives for the proposed anesthesia with the patient or authorized representative who has indicated his/her understanding and acceptance.       Plan Discussed with:   Anesthesia Plan Comments: (Lab Results      Component                Value               Date                      WBC                      7.9                 01/09/2019                HGB                      11.4 (L)            01/09/2019                HCT                      35.7 (L)            01/09/2019                MCV                      96.7                01/09/2019                PLT                      300                 01/09/2019            COVID-19 Labs  No results for input(s): DDIMER, FERRITIN, LDH, CRP in the last 72 hours.  Lab Results      Component  Value               Date                      SARSCOV2NAA               NEGATIVE            01/09/2019                SARSCOV2NAA              NOT DETECTED        11/26/2018            )        Anesthesia Quick Evaluation

## 2019-01-09 NOTE — Progress Notes (Signed)
Interval Progress Note  Called to room with RN because patient feeling lots of pressure. On recheck, patient is 6-7/80/0. Will try pushing epidural bolus and repositioning.   Lambert Mody. Juleen China, DO OB/GYN Fellow

## 2019-01-09 NOTE — Anesthesia Procedure Notes (Signed)
Epidural Patient location during procedure: OB Start time: 01/09/2019 11:46 AM End time: 01/09/2019 11:52 AM  Staffing Anesthesiologist: Effie Berkshire, MD Performed: anesthesiologist   Preanesthetic Checklist Completed: patient identified, site marked, surgical consent, pre-op evaluation, timeout performed, IV checked, risks and benefits discussed and monitors and equipment checked  Epidural Patient position: sitting Prep: ChloraPrep Patient monitoring: heart rate, continuous pulse ox and blood pressure Approach: midline Location: L3-L4 Injection technique: LOR saline  Needle:  Needle type: Tuohy  Needle gauge: 17 G Needle length: 9 cm Catheter type: closed end flexible Catheter size: 20 Guage Test dose: negative and 1.5% lidocaine  Assessment Events: blood not aspirated, injection not painful, no injection resistance and no paresthesia  Additional Notes LOR @ 4.5  Patient identified. Risks/Benefits/Options discussed with patient including but not limited to bleeding, infection, nerve damage, paralysis, failed block, incomplete pain control, headache, blood pressure changes, nausea, vomiting, reactions to medications, itching and postpartum back pain. Confirmed with bedside nurse the patient's most recent platelet count. Confirmed with patient that they are not currently taking any anticoagulation, have any bleeding history or any family history of bleeding disorders. Patient expressed understanding and wished to proceed. All questions were answered. Sterile technique was used throughout the entire procedure. Please see nursing notes for vital signs. Test dose was given through epidural catheter and negative prior to continuing to dose epidural or start infusion. Warning signs of high block given to the patient including shortness of breath, tingling/numbness in hands, complete motor block, or any concerning symptoms with instructions to call for help. Patient was given instructions  on fall risk and not to get out of bed. All questions and concerns addressed with instructions to call with any issues or inadequate analgesia.    Reason for block:procedure for pain

## 2019-01-09 NOTE — Progress Notes (Signed)
LABOR PROGRESS NOTE  Elizabeth Shaffer is a 23 y.o. G1P0000 at [redacted]w[redacted]d  admitted for early labor, SROM.  Subjective: She is doing well resting in bed. She is experiencing some discomfort and pressure in her perineum during contractions.  Objective: BP 120/88   Pulse (!) 119   Temp 99.1 F (37.3 C) (Oral)   Resp 18   Ht 5' (1.524 m)   Wt 63.5 kg   LMP 03/16/2018   SpO2 100%   BMI 27.34 kg/m  or  Vitals:   01/09/19 1919 01/09/19 1920 01/09/19 1932 01/09/19 2000  BP:   (!) 142/101 120/88  Pulse:   (!) 133 (!) 119  Resp:      Temp: 99.2 F (37.3 C) 99.1 F (37.3 C)    TempSrc: Axillary Oral    SpO2:      Weight:      Height:        Dilation: 10 Dilation Complete Date: 01/09/19 Dilation Complete Time: 2040 Effacement (%): 100 Cervical Position: Anterior Station: Plus 1 Presentation: Vertex Exam by:: D Barnicle RN FHT: baseline rate 150bpm, moderate varibility, 15 x 15 acel, occasional variable decels Toco: 2-3 mins  Labs: Lab Results  Component Value Date   WBC 7.9 01/09/2019   HGB 11.4 (L) 01/09/2019   HCT 35.7 (L) 01/09/2019   MCV 96.7 01/09/2019   PLT 300 01/09/2019    Patient Active Problem List   Diagnosis Date Noted  . PROM (premature rupture of membranes) 01/09/2019  . Gestational hypertension 01/09/2019  . Preterm uterine contractions 12/03/2018  . Anemia affecting pregnancy in third trimester 11/02/2018  . Supervision of low-risk first pregnancy 07/20/2018  . GERD (gastroesophageal reflux disease) 07/20/2018    Assessment / Plan: 23 y.o. G1P0000 at [redacted]w[redacted]d here for  early labor, SROM.  Labor: Pitocin 68mU/min. IUPC (160 MVUs).  Fetal Wellbeing: Cat I, Vertex Pain Control:  Epidural Anticipated MOD:  Vaginal  Dewayne Jurek Autry-Lott, D.O. Family Medicine Resident, PGY-1 01/09/2019, 8:44 PM

## 2019-01-09 NOTE — Progress Notes (Signed)
Vitals:   01/09/19 2000 01/09/19 2116  BP: 120/88   Pulse: (!) 119   Resp:    Temp:  99.7 F (37.6 C)  SpO2:     Pushed for an hour, but now is tired and not moving baby well anymore.  Not feeling as much pressure/urge to push as earlier.  C/C/+1, FHR Cat 1, baseline 150's. Will keep an eye on temperature and allow to rest/labor down for a little bit.

## 2019-01-09 NOTE — H&P (Signed)
Elizabeth Shaffer is a 23 y.o. female presenting for SROM at 0700 today. Patient reports she woke up and realized her bed was saturated with amniotic fluid. She also endorses 10/10 lower abdominal contraction pain. She denies vaginal bleeding, decreased fetal movement, fever, falls, or recent illness.   Prenatal History --[facility] --Dating by first trimester [place] --Genetic screening declined --Flu vaccine 08/02/18 --TDAP 10/27/18 --Rubella Immune  --Varicella Immune  OB History    Gravida  1   Para  0   Term  0   Preterm  0   AB  0   Living  0     SAB  0   TAB  0   Ectopic  0   Multiple  0   Live Births  0          Past Medical History:  Diagnosis Date  . GERD (gastroesophageal reflux disease)   . Headache    Past Surgical History:  Procedure Laterality Date  . MOUTH SURGERY  2011  . MULTIPLE TOOTH EXTRACTIONS    . WISDOM TOOTH EXTRACTION     Family History: family history includes Hypertension in her mother. Social History:  reports that she has never smoked. She has never used smokeless tobacco. She reports previous alcohol use. She reports previous drug use. Drug: Marijuana.     Maternal Diabetes: No Genetic Screening: Declined Maternal Ultrasounds/Referrals: Normal Fetal Ultrasounds or other Referrals:  None Maternal Substance Abuse:  No Significant Maternal Medications:  None Significant Maternal Lab Results:  Group B Strep negative Other Comments:  N/A  Review of Systems  Respiratory: Negative for shortness of breath.   Gastrointestinal: Positive for abdominal pain.  Musculoskeletal: Positive for back pain.  Neurological: Negative for headaches.  All other systems reviewed and are negative. Dilation: 3 Effacement (%): 90 Station: -1 Exam by:: T Lytle RN  Blood pressure 111/66, pulse 86, temperature 97.7 F (36.5 C), temperature source Oral, resp. rate 18, height 5' (1.524 m), weight 63.5 kg, last menstrual period 03/16/2018, SpO2 100  %. Physical Exam  Nursing note and vitals reviewed. Constitutional: She is oriented to person, place, and time. She appears well-developed and well-nourished.  Cardiovascular: Normal rate.  Respiratory: Effort normal and breath sounds normal. No respiratory distress.  GI: She exhibits no distension. There is no abdominal tenderness. There is no rebound and no guarding.  Musculoskeletal: Normal range of motion.  Neurological: She is alert and oriented to person, place, and time.  Skin: Skin is warm and dry.  Psychiatric: She has a normal mood and affect. Her behavior is normal. Judgment and thought content normal.    Prenatal labs: ABO, Rh: --/--/B POS (07/27 0940) Antibody: NEG (07/27 0940) Rubella: 3.41 (02/04 1030) RPR: Non Reactive (05/19 0937)  HBsAg: Negative (02/04 1030)  HIV: Non Reactive (05/19 0937)  GBS: Negative (07/11 0000)   Fetal Surveillance --Category I tracing --Baseline 130, moderate variability, positive accels, no decels --Toco irregular mild contractions q 2-5 min  Assessment/Plan: --23 y.o. G1P0000 at [redacted]w[redacted]d  --Category I tracing --SROM today at 0700 --GBS negative --Making cervical change without augmentation (1.5 to 3cm in MAU) --Elevated BP prior to IV Fentanyl, then normotensive, normal PEC lab results --Desires epidural --Monitor for spontaneous cervical change. Consider augmentation with Pitocin if no change over next 4-6 hours --Girl/bottle/OCPs --Anticipate NSVD   [name], CNM 01/09/2019, 11:23 AM

## 2019-01-09 NOTE — Progress Notes (Addendum)
OB/GYN Faculty Practice: Labor Progress Note  Subjective: Patient feeling a lot of pressure, otherwise doing well without complaints.  Objective: BP 128/83   Pulse (!) 111   Temp 99.2 F (37.3 C) (Axillary)   Resp 16   Ht 5' (1.524 m)   Wt 63.5 kg   LMP 03/16/2018   SpO2 100%   BMI 27.34 kg/m  Gen: appears uncomfortable Cervical exam: 9.5/90/+1 per RN check around 1900  Assessment and Plan: 23 y.o. G1P0000 [redacted]w[redacted]d admitted for SROM @0700  on 01/09/19.  Labor: progressing well, pit @2 , IUPC in place -- pain control: CSE  Fetal Well-Being:  -- Category 1 - continuous fetal monitoring  -- GBS neg   GHTN: diagnosed IP, nml PEC labs, no meds needed --Continue to monitor  Corliss Blacker, PGY-III Family Medicine 7:11 PM

## 2019-01-09 NOTE — Progress Notes (Signed)
Elizabeth Shaffer is a 23 y.o. G1P0000 at [redacted]w[redacted]d admitted for SROM this morning around 0700.  Subjective: Epidural in place, denies pain, questions or concerns  Objective: BP (!) 139/93   Pulse 70   Temp 98.2 F (36.8 C) (Oral)   Resp 18   Ht 5' (1.524 m)   Wt 63.5 kg   LMP 03/16/2018   SpO2 100%   BMI 27.34 kg/m  No intake/output data recorded. No intake/output data recorded.  FHT:  FHR: 125 bpm, variability: moderate,  accelerations:  Present,  decelerations:  Absent UC:   irregular, every up to 7 minutes between ctx SVE:   Dilation: 5.5 Effacement (%): 90 Station: 0 Exam by:: Weinhold, CNM  Labs: Lab Results  Component Value Date   WBC 7.9 01/09/2019   HGB 11.4 (L) 01/09/2019   HCT 35.7 (L) 01/09/2019   MCV 96.7 01/09/2019   PLT 300 01/09/2019    Assessment / Plan: --23 y.o. G1P0000 at [redacted]w[redacted]d  --Almost 9 hours s/p SROM --Cervix unchanged from previous exam  --Begin Pitocin titration 2 x 2 --IUPC placed --Epidural effective, pt denies pain --Anticiptae NSVD  [name], CNM 01/09/2019, 3:41 PM

## 2019-01-09 NOTE — MAU Note (Signed)
Pt reports waking up around 7 am to wet bed. Fluid has continued to come out since then. Also feeling painful contractions. Denies VB. +FM.

## 2019-01-09 NOTE — Progress Notes (Signed)
Elizabeth Shaffer is a 23 y.o. G1P0000 at [redacted]w[redacted]d admitted for active labor, rupture of membranes this morning at 0700.  Subjective: Resting comfortably s/p epidural. Denies complaints of concerns.  Objective: BP (!) 125/93   Pulse (!) 134   Temp 98.2 F (36.8 C) (Oral)   Resp 18   Ht 5' (1.524 m)   Wt 63.5 kg   LMP 03/16/2018   SpO2 100%   BMI 27.34 kg/m  No intake/output data recorded. No intake/output data recorded.  FHT:  FHR: 140 bpm, variability: moderate,  accelerations:  Present,  decelerations:  Absent UC:   irregular, up to 11 minutes between contractions SVE:   Dilation: 5.5 Effacement (%): 90 Station: 0 Exam by:: Mabeline Caras, RN  Labs: Lab Results  Component Value Date   WBC 7.9 01/09/2019   HGB 11.4 (L) 01/09/2019   HCT 35.7 (L) 01/09/2019   MCV 96.7 01/09/2019   PLT 300 01/09/2019    Assessment / Plan: S/p SROM 0700 today, clear fluid, afebrile Recurrence of elevated BP, normla PEC labs on admission.  Meets criteria for dx of Gestational Hypertension Category I tracing Spontaneous change without augmentation, continue expectant management Anticipate NSVD  [name], CNM 01/09/2019, 12:58 PM

## 2019-01-10 ENCOUNTER — Encounter (HOSPITAL_COMMUNITY): Payer: Self-pay

## 2019-01-10 DIAGNOSIS — Z3A39 39 weeks gestation of pregnancy: Secondary | ICD-10-CM

## 2019-01-10 LAB — RPR: RPR Ser Ql: NONREACTIVE

## 2019-01-10 LAB — CBC
HCT: 27.6 % — ABNORMAL LOW (ref 36.0–46.0)
Hemoglobin: 9.2 g/dL — ABNORMAL LOW (ref 12.0–15.0)
MCH: 30.6 pg (ref 26.0–34.0)
MCHC: 33.3 g/dL (ref 30.0–36.0)
MCV: 91.7 fL (ref 80.0–100.0)
Platelets: 241 10*3/uL (ref 150–400)
RBC: 3.01 MIL/uL — ABNORMAL LOW (ref 3.87–5.11)
RDW: 13.5 % (ref 11.5–15.5)
WBC: 11.6 10*3/uL — ABNORMAL HIGH (ref 4.0–10.5)
nRBC: 0 % (ref 0.0–0.2)

## 2019-01-10 LAB — ABO/RH: ABO/RH(D): B POS

## 2019-01-10 MED ORDER — DIBUCAINE (PERIANAL) 1 % EX OINT: 1.0000 "application " | TOPICAL_OINTMENT | CUTANEOUS | Status: DC | PRN

## 2019-01-10 MED ORDER — ZOLPIDEM TARTRATE 5 MG PO TABS: 5.0000 mg | ORAL_TABLET | Freq: Every evening | ORAL | Status: DC | PRN

## 2019-01-10 MED ORDER — ONDANSETRON HCL 4 MG/2ML IJ SOLN: 4.0000 mg | INTRAMUSCULAR | Status: DC | PRN

## 2019-01-10 MED ORDER — BENZOCAINE-MENTHOL 20-0.5 % EX AERO
1.0000 "application " | INHALATION_SPRAY | CUTANEOUS | Status: DC | PRN
  Administered 2019-01-10: 1 via TOPICAL
  Filled 2019-01-10: qty 56

## 2019-01-10 MED ORDER — TETANUS-DIPHTH-ACELL PERTUSSIS 5-2.5-18.5 LF-MCG/0.5 IM SUSP: 0.5000 mL | Freq: Once | INTRAMUSCULAR | Status: DC

## 2019-01-10 MED ORDER — COCONUT OIL OIL: 1.0000 "application " | TOPICAL_OIL | Status: DC | PRN

## 2019-01-10 MED ORDER — SENNOSIDES-DOCUSATE SODIUM 8.6-50 MG PO TABS
2.0000 | ORAL_TABLET | ORAL | Status: DC
  Administered 2019-01-11: 2 via ORAL
  Filled 2019-01-10: qty 2

## 2019-01-10 MED ORDER — IBUPROFEN 600 MG PO TABS
600.0000 mg | ORAL_TABLET | Freq: Four times a day (QID) | ORAL | Status: DC
  Administered 2019-01-10 – 2019-01-11 (×6): 600 mg via ORAL
  Filled 2019-01-10 (×6): qty 1

## 2019-01-10 MED ORDER — DIPHENHYDRAMINE HCL 25 MG PO CAPS: 25.0000 mg | ORAL_CAPSULE | Freq: Four times a day (QID) | ORAL | Status: DC | PRN

## 2019-01-10 MED ORDER — PRENATAL MULTIVITAMIN CH
1.0000 | ORAL_TABLET | Freq: Every day | ORAL | Status: DC
  Administered 2019-01-10 – 2019-01-11 (×2): 1 via ORAL
  Filled 2019-01-10 (×2): qty 1

## 2019-01-10 MED ORDER — ONDANSETRON HCL 4 MG PO TABS: 4.0000 mg | ORAL_TABLET | ORAL | Status: DC | PRN

## 2019-01-10 MED ORDER — FERROUS SULFATE 325 (65 FE) MG PO TABS
325.0000 mg | ORAL_TABLET | Freq: Two times a day (BID) | ORAL | Status: DC
  Administered 2019-01-10 – 2019-01-11 (×3): 325 mg via ORAL
  Filled 2019-01-10 (×3): qty 1

## 2019-01-10 MED ORDER — ACETAMINOPHEN 325 MG PO TABS
650.0000 mg | ORAL_TABLET | ORAL | Status: DC | PRN
  Administered 2019-01-10 (×2): 650 mg via ORAL
  Filled 2019-01-10 (×2): qty 2

## 2019-01-10 MED ORDER — SIMETHICONE 80 MG PO CHEW: 80.0000 mg | CHEWABLE_TABLET | ORAL | Status: DC | PRN

## 2019-01-10 MED ORDER — WITCH HAZEL-GLYCERIN EX PADS: 1.0000 "application " | MEDICATED_PAD | CUTANEOUS | Status: DC | PRN

## 2019-01-10 NOTE — Anesthesia Postprocedure Evaluation (Signed)
Anesthesia Post Note  Patient: Elizabeth Shaffer  Procedure(s) Performed: AN AD Gila     Patient location during evaluation: Mother Baby Anesthesia Type: Epidural Level of consciousness: awake and alert Pain management: pain level controlled Vital Signs Assessment: post-procedure vital signs reviewed and stable Respiratory status: spontaneous breathing, nonlabored ventilation and respiratory function stable Cardiovascular status: stable Postop Assessment: no headache, no backache, epidural receding, no apparent nausea or vomiting, patient able to bend at knees, adequate PO intake and able to ambulate Anesthetic complications: no    Last Vitals:  Vitals:   01/10/19 0300 01/10/19 0656  BP: (!) 134/98 119/84  Pulse: 76 79  Resp: 18 18  Temp: 36.8 C 36.6 C  SpO2:      Last Pain:  Vitals:   01/10/19 0657  TempSrc:   PainSc: 2    Pain Goal:                Epidural/Spinal Function Cutaneous sensation: Normal sensation (01/10/19 0657), Patient able to flex knees: Yes (01/10/19 0657), Patient able to lift hips off bed: Yes (01/10/19 0657), Back pain beyond tenderness at insertion site: No (01/10/19 0657), Progressively worsening motor and/or sensory loss: No (01/10/19 0657), Bowel and/or bladder incontinence post epidural: No (01/10/19 0657)  Jabier Mutton

## 2019-01-10 NOTE — Progress Notes (Signed)
POSTPARTUM PROGRESS NOTE  Subjective: Elizabeth Shaffer is a 23 y.o. G1P1001 PP#1 s/p SVD at [redacted]w[redacted]d.  She reports she doing well. No acute events overnight. She denies any problems with ambulating, voiding or po intake. Denies nausea or vomiting. She has passed flatus. Pain is well controlled.  Lochia is improving. Denies headaches, vision changes, CP, SOB, RUQ pain.  Objective: Blood pressure 119/84, pulse 79, temperature 97.9 F (36.6 C), temperature source Oral, resp. rate 18, height 5' (1.524 m), weight 63.5 kg, last menstrual period 03/16/2018, SpO2 99 %, unknown if currently breastfeeding.  Physical Exam:  General: alert, cooperative and no distress Chest: no respiratory distress Abdomen: soft, non-tender  Uterine Fundus: firm, appropriately tender Extremities: No calf swelling or tenderness  no edema  Recent Labs    01/09/19 0901 01/10/19 0452  HGB 11.4* 9.2*  HCT 35.7* 27.6*    Assessment/Plan: Elizabeth Shaffer is a 23 y.o. G1P1001 PP#1 s/p SVD at [redacted]w[redacted]d  Routine Postpartum Care: Doing well, pain well-controlled.  -- Continue routine care, lactation support  -- Contraception: OCPs -- Feeding: bottle  Postpartum anemia --PP Hgb 7/28 is 9/2 (previously 11.4 on 7/27) --ferrous sulfate 325 mg BID  gHTN --Blood pressures mildly elevated overnight 130-140s SBP --No meds required --Pt remains asymptomatic --Latest BP 119/84, will continue to monitor  Dispo: Plan for discharge home, likely tomorrow.  Elizabeth Shaffer, [facility]

## 2019-01-10 NOTE — Lactation Note (Signed)
This note was copied from a baby's chart. Lactation Consultation Note  Patient Name: Elizabeth Shaffer Date: 01/10/2019 Reason for consult: Initial assessment   LC Visit:  Mother's feeding choice on admission was breast/bottle.  However, mother has been formula feeding only since delivery.  Spoke with mother and verified that her preference now is bottle feeding only.  Lactation services will not be needed.                  Consult Status Consult Status: Complete    Sharif Rendell R Zeric Baranowski 01/10/2019, 11:59 AM

## 2019-01-10 NOTE — Discharge Summary (Addendum)
Postpartum Discharge Summary     Patient Name: Elizabeth Shaffer DOB: 12/18/1995 MRN: [id number]  Date of admission: 01/09/2019 Delivering Provider: Christin Fudge   Date of discharge: 01/11/2019  Admitting diagnosis: 23WKS WATER BROKE Intrauterine pregnancy: [redacted]w[redacted]d     Secondary diagnosis:  Active Problems:   PROM (premature rupture of membranes)   Gestational hypertension   Vaginal delivery  Additional problems: none     Discharge diagnosis: Term Pregnancy Delivered                                                                                                Post partum procedures:none  Augmentation: AROM, Pitocin, Cytotec and Foley Balloon  Complications: None  Hospital course:  Induction of Labor With Vaginal Delivery   23 y.o. yo G1P0000 at [redacted]w[redacted]d was admitted to the hospital 01/09/2019 for induction of labor.  Indication for induction: Gestational hypertension.  Patient had an uncomplicated labor course as follows: Membrane Rupture Time/Date: 7:00 AM ,01/09/2019   Intrapartum Procedures: Episiotomy: None [1]                                         Lacerations:  None [1]  Patient had delivery of a Viable infant.  Information for the patient's newborn:  Tucker, Steedley [[id number]]  Delivery Method: Vag-Spont    01/09/2019  Details of delivery can be found in separate delivery note.  Patient had a routine postpartum course. Patient is discharged home 01/11/19.  Magnesium Sulfate recieved: No BMZ received: No  Physical exam  Vitals:   01/10/19 1130 01/10/19 1510 01/10/19 2244 01/11/19 0500  BP: 128/85 125/82 126/85 (!) 135/96  Pulse: 89 74 84 71  Resp: 18 17  16   Temp: 97.9 F (36.6 C) 98.4 F (36.9 C) 98.7 F (37.1 C) 98 F (36.7 C)  TempSrc: Oral Oral Oral Oral  SpO2:   100%   Weight:      Height:       General: alert, cooperative and no distress Lochia: appropriate Uterine Fundus: firm Incision: N/A DVT Evaluation: No evidence of DVT seen on  physical exam. No cords or calf tenderness. No significant calf/ankle edema. Labs: Lab Results  Component Value Date   WBC 11.6 (H) 01/10/2019   HGB 9.2 (L) 01/10/2019   HCT 27.6 (L) 01/10/2019   MCV 91.7 01/10/2019   PLT 241 01/10/2019   CMP Latest Ref Rng & Units 01/09/2019  Glucose 70 - 99 mg/dL 83  BUN 6 - 20 mg/dL <5(L)  Creatinine 0.44 - 1.00 mg/dL 0.72  Sodium 135 - 145 mmol/L 138  Potassium 3.5 - 5.1 mmol/L 3.6  Chloride 98 - 111 mmol/L 105  CO2 22 - 32 mmol/L 21(L)  Calcium 8.9 - 10.3 mg/dL 9.1  Total Protein 6.5 - 8.1 g/dL 6.8  Total Bilirubin 0.3 - 1.2 mg/dL 0.7  Alkaline Phos 38 - 126 U/L 168(H)  AST 15 - 41 U/L 16  ALT 0 - 44 U/L 10    Discharge instruction: per  After Visit Summary and "Baby and Me Booklet".  After visit meds:  Allergies as of 01/11/2019   No Known Allergies     Medication List    TAKE these medications   acetaminophen 500 MG tablet Commonly known as: TYLENOL Take 1,000 mg by mouth every 6 (six) hours as needed for mild pain or headache.   cyclobenzaprine 5 MG tablet Commonly known as: FLEXERIL Take 1 tablet (5 mg total) by mouth 3 (three) times daily as needed for muscle spasms.   famotidine 20 MG tablet Commonly known as: PEPCID Take 1 tablet (20 mg total) by mouth 2 (two) times daily.   ferrous sulfate 325 (65 FE) MG tablet Take 1 tablet (325 mg total) by mouth 2 (two) times daily with a meal.   ibuprofen 600 MG tablet Commonly known as: ADVIL Take 1 tablet (600 mg total) by mouth every 6 (six) hours.   multivitamin-prenatal 27-0.8 MG Tabs tablet Take 1 tablet by mouth daily at 12 noon.   ondansetron 8 MG disintegrating tablet Commonly known as: ZOFRAN-ODT DISSOLVE 1 TABLET(8 MG) ON THE TONGUE EVERY 8 HOURS AS NEEDED FOR NAUSEA OR VOMITING   senna-docusate 8.6-50 MG tablet Commonly known as: Senokot-S Take 2 tablets by mouth daily. Start taking on: January 12, 2019   zolpidem 5 MG tablet Commonly known as: AMBIEN Take  1 tablet (5 mg total) by mouth at bedtime as needed for sleep.       Diet: routine diet  Activity: Advance as tolerated. Pelvic rest for 6 weeks.   Outpatient follow up:4 weeks Follow up Appt: Future Appointments  Date Time Provider Department Center  01/18/2019  1:30 PM [facility] NURSE [facility]  02/07/2019  9:15 AM Dorathy KinsmanSmith, [place], CNM [facility]   Follow up Visit:   Please schedule this patient for Postpartum visit in: 4 weeks with the following provider: Any provider For C/S patients schedule nurse incision check in weeks 2 weeks:  High risk pregnancy complicated by: HTN Delivery mode:  SVD Anticipated Birth Control:  OCPs PP Procedures needed: BP check  (can do virtual visit, has bp cuff at home) Schedule Integrated BH visit: no      Newborn Data: Live born female  Birth Weight:  2651g APGAR: 8/9  Newborn Delivery   Birth date/time: 01/09/2019 23:47:00 Delivery type:       Baby Feeding: Bottle Disposition:home with mother   01/11/2019 Alric SetonAlicia C Firestone, MD  OB FELLOW DISCHARGE ATTESTATION  I have seen and examined this patient and agree with above documentation in the resident's note.   Marcy Sirenatherine Charle Mclaurin, D.O. OB Fellow  01/11/2019, 11:09 AM

## 2019-01-10 NOTE — Plan of Care (Signed)
  Problem: Role Relationship: Goal: Will demonstrate positive interactions with the child Outcome: Progressing   

## 2019-01-11 MED ORDER — IBUPROFEN 600 MG PO TABS: 600.0000 mg | ORAL_TABLET | Freq: Four times a day (QID) | ORAL | 0 refills | Status: DC

## 2019-01-11 MED ORDER — SENNOSIDES-DOCUSATE SODIUM 8.6-50 MG PO TABS: 2.0000 | ORAL_TABLET | ORAL | 0 refills | Status: DC

## 2019-01-11 MED ORDER — FERROUS SULFATE 325 (65 FE) MG PO TABS: 325.0000 mg | ORAL_TABLET | Freq: Two times a day (BID) | ORAL | 3 refills | Status: DC

## 2019-01-11 NOTE — Discharge Instructions (Signed)

## 2019-01-16 ENCOUNTER — Other Ambulatory Visit: Payer: Medicaid Other

## 2019-01-16 ENCOUNTER — Encounter: Payer: Medicaid Other | Admitting: Advanced Practice Midwife

## 2019-01-18 ENCOUNTER — Telehealth (INDEPENDENT_AMBULATORY_CARE_PROVIDER_SITE_OTHER): Payer: Medicaid Other | Admitting: General Practice

## 2019-01-18 VITALS — BP 138/88

## 2019-01-18 DIAGNOSIS — Z0130 Encounter for examination of blood pressure without abnormal findings: Secondary | ICD-10-CM

## 2019-01-18 NOTE — Progress Notes (Signed)
Telephone call to patient at home for BP check appt. Patient delivered vaginally 7/28- hx significant for gestational HTN. Patient reports doing well since delivery but has occasional headaches that she does not take medication for. Patient also reports seeing spots/stars occasionally like she did at the end of pregnancy- denies swelling. Reports 134/91 and 138/88 BP today. Reviewed with Dr Hulan Fray who states patient should occasionally monitor BP at home especially if symptomatic, but no intervention needed at this time. Discussed with patient and reviewed precautions. Told patient she should monitor her BP every few days or so especially if she becomes symptomatic & to call us with any elevated BP or go to MAU if after hours. Patient verbalized understanding & had no questions.  Koren Bound RN BSN 01/18/19

## 2019-01-20 ENCOUNTER — Inpatient Hospital Stay (HOSPITAL_COMMUNITY)
Admission: AD | Admit: 2019-01-20 | Discharge: 2019-01-20 | Disposition: A | Discharge status: Home / Self Care | Payer: Medicaid Other | Attending: Obstetrics and Gynecology | Admitting: Obstetrics and Gynecology

## 2019-01-20 ENCOUNTER — Other Ambulatory Visit: Payer: Self-pay

## 2019-01-20 ENCOUNTER — Encounter (HOSPITAL_COMMUNITY): Payer: Self-pay | Admitting: *Deleted

## 2019-01-20 DIAGNOSIS — R51 Headache: Secondary | ICD-10-CM | POA: Diagnosis not present

## 2019-01-20 DIAGNOSIS — R519 Headache, unspecified: Secondary | ICD-10-CM

## 2019-01-20 DIAGNOSIS — O165 Unspecified maternal hypertension, complicating the puerperium: Secondary | ICD-10-CM | POA: Diagnosis present

## 2019-01-20 LAB — COMPREHENSIVE METABOLIC PANEL
ALT: 16 U/L (ref 0–44)
AST: 16 U/L (ref 15–41)
Albumin: 3.5 g/dL (ref 3.5–5.0)
Alkaline Phosphatase: 116 U/L (ref 38–126)
Anion gap: 9 (ref 5–15)
BUN: <5 mg/dL — ABNORMAL LOW (ref 6–20)
CO2: 26 mmol/L (ref 22–32)
Calcium: 9.2 mg/dL (ref 8.9–10.3)
Chloride: 104 mmol/L (ref 98–111)
Creatinine, Ser: 0.85 mg/dL (ref 0.44–1.00)
GFR calc Af Amer: >60 mL/min (ref >60)
GFR calc non Af Amer: >60 mL/min (ref >60)
Glucose, Bld: 65 mg/dL — ABNORMAL LOW (ref 70–99)
Potassium: 3.8 mmol/L (ref 3.5–5.1)
Sodium: 139 mmol/L (ref 135–145)
Total Bilirubin: 0.9 mg/dL (ref 0.3–1.2)
Total Protein: 7.3 g/dL (ref 6.5–8.1)

## 2019-01-20 LAB — CBC
HCT: 39.3 % (ref 36.0–46.0)
Hemoglobin: 12.6 g/dL (ref 12.0–15.0)
MCH: 30.3 pg (ref 26.0–34.0)
MCHC: 32.1 g/dL (ref 30.0–36.0)
MCV: 94.5 fL (ref 80.0–100.0)
Platelets: 392 10*3/uL (ref 150–400)
RBC: 4.16 MIL/uL (ref 3.87–5.11)
RDW: 13.7 % (ref 11.5–15.5)
WBC: 7.1 10*3/uL (ref 4.0–10.5)
nRBC: 0 % (ref 0.0–0.2)

## 2019-01-20 LAB — PROTEIN / CREATININE RATIO, URINE
Creatinine, Urine: 90.28 mg/dL
Protein Creatinine Ratio: 0.08 mg/mg{Cre} (ref 0.00–0.15)
Total Protein, Urine: 7 mg/dL

## 2019-01-20 MED ORDER — NIFEDIPINE 10 MG PO CAPS
10.0000 mg | ORAL_CAPSULE | Freq: Once | ORAL | Status: AC
  Administered 2019-01-20: 10 mg via ORAL
  Filled 2019-01-20: qty 1

## 2019-01-20 MED ORDER — METOCLOPRAMIDE HCL 5 MG/ML IJ SOLN: 10.0000 mg | Freq: Once | INTRAMUSCULAR | Status: DC

## 2019-01-20 MED ORDER — DEXAMETHASONE SODIUM PHOSPHATE 10 MG/ML IJ SOLN
10.0000 mg | Freq: Once | INTRAMUSCULAR | Status: AC
  Administered 2019-01-20: 10 mg via INTRAVENOUS
  Filled 2019-01-20: qty 1

## 2019-01-20 MED ORDER — DEXAMETHASONE SODIUM PHOSPHATE 10 MG/ML IJ SOLN: 10.0000 mg | Freq: Once | INTRAMUSCULAR | Status: DC

## 2019-01-20 MED ORDER — METOCLOPRAMIDE HCL 5 MG/ML IJ SOLN
10.0000 mg | Freq: Once | INTRAMUSCULAR | Status: AC
  Administered 2019-01-20: 10 mg via INTRAVENOUS
  Filled 2019-01-20: qty 2

## 2019-01-20 MED ORDER — NIFEDIPINE ER OSMOTIC RELEASE 30 MG PO TB24: 30.0000 mg | ORAL_TABLET | Freq: Every day | ORAL | 0 refills | Status: DC

## 2019-01-20 MED ORDER — DIPHENHYDRAMINE HCL 50 MG/ML IJ SOLN
25.0000 mg | Freq: Once | INTRAMUSCULAR | Status: AC
  Administered 2019-01-20: 25 mg via INTRAVENOUS
  Filled 2019-01-20: qty 1

## 2019-01-20 MED ORDER — LABETALOL HCL 5 MG/ML IV SOLN
20.0000 mg | Freq: Once | INTRAVENOUS | Status: AC
  Administered 2019-01-20: 20 mg via INTRAVENOUS
  Filled 2019-01-20: qty 4

## 2019-01-20 MED ORDER — SODIUM CHLORIDE 0.9 % IV SOLN: INTRAVENOUS | Status: DC

## 2019-01-20 MED ORDER — DIPHENHYDRAMINE HCL 50 MG/ML IJ SOLN: 25.0000 mg | Freq: Once | INTRAMUSCULAR | Status: DC

## 2019-01-20 MED ORDER — SODIUM CHLORIDE 0.9 % IV SOLN
INTRAVENOUS | Status: DC
  Administered 2019-01-20: 19:00:00 via INTRAVENOUS

## 2019-01-20 NOTE — Discharge Instructions (Signed)
Postpartum Hypertension °Postpartum hypertension is high blood pressure that remains higher than normal after childbirth. You may not realize that you have postpartum hypertension if your blood pressure is not being checked regularly. In most cases, postpartum hypertension will go away on its own, usually within a week of delivery. However, for some women, medical treatment is required to prevent serious complications, such as seizures or stroke. °What are the causes? °This condition may be caused by one or more of the following: °· Hypertension that existed before pregnancy (chronic hypertension). °· Hypertension that comes on as a result of pregnancy (gestational hypertension). °· Hypertensive disorders during pregnancy (preeclampsia) or seizures in women who have high blood pressure during pregnancy (eclampsia). °· A condition in which the liver, platelets, and red blood cells are damaged during pregnancy (HELLP syndrome). °· A condition in which the thyroid produces too much hormones (hyperthyroidism). °· Other rare problems of the nerves (neurological disorders) or blood disorders. °In some cases, the cause may not be known. °What increases the risk? °The following factors may make you more likely to develop this condition: °· Chronic hypertension. In some cases, this may not have been diagnosed before pregnancy. °· Obesity. °· Type 2 diabetes. °· Kidney disease. °· History of preeclampsia or eclampsia. °· Other medical conditions that change the level of hormones in the body (hormonal imbalance). °What are the signs or symptoms? °As with all types of hypertension, postpartum hypertension may not have any symptoms. Depending on how high your blood pressure is, you may experience: °· Headaches. These may be mild, moderate, or severe. They may also be steady, constant, or sudden in onset (thunderclap headache). °· Changes in your ability to see (visual changes). °· Dizziness. °· Shortness of breath. °· Swelling  of your hands, feet, lower legs, or face. In some cases, you may have swelling in more than one of these locations. °· Heart palpitations or a racing heartbeat. °· Difficulty breathing while lying down. °· Decrease in the amount of urine that you pass. °Other rare signs and symptoms may include: °· Sweating more than usual. This lasts longer than a few days after delivery. °· Chest pain. °· Sudden dizziness when you get up from sitting or lying down. °· Seizures. °· Nausea or vomiting. °· Abdominal pain. °How is this diagnosed? °This condition may be diagnosed based on the results of a physical exam, blood pressure measurements, and blood and urine tests. °You may also have other tests, such as a CT scan or an MRI, to check for other problems of postpartum hypertension. °How is this treated? °If blood pressure is high enough to require treatment, your options may include: °· Medicines to reduce blood pressure (antihypertensives). Tell your health care provider if you are breastfeeding or if you plan to breastfeed. There are many antihypertensive medicines that are safe to take while breastfeeding. °· Stopping medicines that may be causing hypertension. °· Treating medical conditions that are causing hypertension. °· Treating the complications of hypertension, such as seizures, stroke, or kidney problems. °Your health care provider will also continue to monitor your blood pressure closely until it is within a safe range for you. °Follow these instructions at home: °· Take over-the-counter and prescription medicines only as told by your health care provider. °· Return to your normal activities as told by your health care provider. Ask your health care provider what activities are safe for you. °· Do not use any products that contain nicotine or tobacco, such as cigarettes and e-cigarettes. If   you need help quitting, ask your health care provider. °· Keep all follow-up visits as told by your health care provider. This  is important. °Contact a health care provider if: °· Your symptoms get worse. °· You have new symptoms, such as: °? A headache that does not get better. °? Dizziness. °? Visual changes. °Get help right away if: °· You suddenly develop swelling in your hands, ankles, or face. °· You have sudden, rapid weight gain. °· You develop difficulty breathing, chest pain, racing heartbeat, or heart palpitations. °· You develop severe pain in your abdomen. °· You have any symptoms of a stroke. "BE FAST" is an easy way to remember the main warning signs of a stroke: °? B - Balance. Signs are dizziness, sudden trouble walking, or loss of balance. °? E - Eyes. Signs are trouble seeing or a sudden change in vision. °? F - Face. Signs are sudden weakness or numbness of the face, or the face or eyelid drooping on one side. °? A - Arms. Signs are weakness or numbness in an arm. This happens suddenly and usually on one side of the body. °? S - Speech. Signs are sudden trouble speaking, slurred speech, or trouble understanding what people say. °? T - Time. Time to call emergency services. Write down what time symptoms started. °· You have other signs of a stroke, such as: °? A sudden, severe headache with no known cause. °? Nausea or vomiting. °? Seizure. °These symptoms may represent a serious problem that is an emergency. Do not wait to see if the symptoms will go away. Get medical help right away. Call your local emergency services (911 in the [place] Do not drive yourself to the hospital. °Summary °· Postpartum hypertension is high blood pressure that remains higher than normal after childbirth. °· In most cases, postpartum hypertension will go away on its own, usually within a week of delivery. °· For some women, medical treatment is required to prevent serious complications, such as seizures or stroke. °This information is not intended to replace advice given to you by your health care provider. Make sure you discuss any questions  you have with your health care provider. °Document Released: 02/02/2014 Document Revised: 07/08/2018 Document Reviewed: 03/22/2017 °Elsevier Patient Education © 2020 [employer] ° °

## 2019-01-20 NOTE — MAU Note (Signed)
Patient had a vaginal delivery on 7/27.  Had Hypertension at time of delivery, but states no one ever told her if she had Pre eclampsia or gestational hypertension.  She was seen on 8/5 in the office with elevated BP and was told to monitor for BP >140 and to come in to MAU.  Here today with BP 153/106 at home and having a "headache and seeing stars."

## 2019-01-20 NOTE — MAU Provider Note (Addendum)
History     CSN: [id number]  Arrival date and time: 01/20/19 1642   First Provider Initiated Contact with Patient 01/20/19 1748      Chief Complaint  Patient presents with  . Hypertension   HPI  Ms.  Landry DykeKendra Plancarte is a 23 y.o. year old [id number] female s/p SVD at 5911 days postpartum who presents to MAU reporting H/A, "little bit of dizziness", some nausea "all day", seeing stars before eyes. She reports BPs at home of 134/91, 138/88, 153/106 today. When she spoke to the RN for OB office she was told to come to MAU for more elevated BPs. She has h/o gHTN while inpatient recently, but was not sent home on HTN medications. She is rating her H/A a 7-8/10. She states that Tylenol "doesn't help."  Past Medical History:  Diagnosis Date  . GERD (gastroesophageal reflux disease)   . Headache     Past Surgical History:  Procedure Laterality Date  . MOUTH SURGERY  2011  . MULTIPLE TOOTH EXTRACTIONS    . WISDOM TOOTH EXTRACTION      Family History  Problem Relation Age of Onset  . Hypertension Mother     Social History   Tobacco Use  . Smoking status: Never Smoker  . Smokeless tobacco: Never Used  Substance Use Topics  . Alcohol use: Not Currently    Comment: on occ  . Drug use: Not Currently    Types: Marijuana    Comment: none since November 2019    Allergies: No Known Allergies  Medications Prior to Admission  Medication Sig Dispense Refill Last Dose  . acetaminophen (TYLENOL) 500 MG tablet Take 1,000 mg by mouth every 6 (six) hours as needed for mild pain or headache.      . cyclobenzaprine (FLEXERIL) 5 MG tablet Take 1 tablet (5 mg total) by mouth 3 (three) times daily as needed for muscle spasms. 20 tablet 0   . famotidine (PEPCID) 20 MG tablet Take 1 tablet (20 mg total) by mouth 2 (two) times daily. 60 tablet 0   . ferrous sulfate 325 (65 FE) MG tablet Take 1 tablet (325 mg total) by mouth 2 (two) times daily with a meal. 60 tablet 3   . ibuprofen (ADVIL) 600 MG tablet  Take 1 tablet (600 mg total) by mouth every 6 (six) hours. 30 tablet 0   . ondansetron (ZOFRAN-ODT) 8 MG disintegrating tablet DISSOLVE 1 TABLET(8 MG) ON THE TONGUE EVERY 8 HOURS AS NEEDED FOR NAUSEA OR VOMITING 20 tablet 2   . Prenatal Vit-Fe Fumarate-FA (MULTIVITAMIN-PRENATAL) 27-0.8 MG TABS tablet Take 1 tablet by mouth daily at 12 noon.     . senna-docusate (SENOKOT-S) 8.6-50 MG tablet Take 2 tablets by mouth daily. 20 tablet 0   . zolpidem (AMBIEN) 5 MG tablet Take 1 tablet (5 mg total) by mouth at bedtime as needed for sleep. 5 tablet 0     Review of Systems  Constitutional: Negative.   HENT: Negative.   Eyes: Negative.   Respiratory: Negative.   Cardiovascular: Negative.   Gastrointestinal: Positive for nausea (all day).  Endocrine: Negative.   Genitourinary: Negative.   Musculoskeletal: Negative.   Skin: Negative.   Allergic/Immunologic: Negative.   Neurological: Positive for dizziness ("a little bit") and headaches (7-8/10).  Hematological: Negative.   Psychiatric/Behavioral: Negative.    Physical Exam   Patient Vitals for the past 24 hrs:  BP Temp Temp src Pulse Resp SpO2 Weight  01/20/19 2119 - 98.4 F (36.9 C) Oral - - - -  01/20/19 2115 131/81 - - 93 - - -  01/20/19 2100 (!) 150/92 - - 85 - - -  01/20/19 2045 (!) 177/96 - - (!) 56 - - -  01/20/19 2030 (!) 168/101 - - (!) 56 - - -  01/20/19 2015 (!) 168/96 - - 62 - - -  01/20/19 2000 (!) 157/92 - - (!) 56 - - -  01/20/19 1945 (!) 147/106 - - (!) 58 - - -  01/20/19 1930 (!) 162/97 - - (!) 59 - - -  01/20/19 1915 (!) 144/94 - - 64 - - -  01/20/19 1900 (!) 151/99 - - 66 - - -  01/20/19 1845 (!) 147/93 - - 62 - - -  01/20/19 1830 (!) 144/102 - - 69 - - -  01/20/19 1815 (!) 144/98 - - 74 - - -  01/20/19 1800 (!) 137/99 - - 80 - - -  01/20/19 1745 137/87 - - 76 - - -  01/20/19 1730 (!) 146/102 - - 83 - - -  01/20/19 1711 (!) 143/109 98.6 F (37 C) Oral 89 17 98 % 125 lb 12.8 oz (57.1 kg)    Physical Exam   Nursing note and vitals reviewed. Constitutional: She is oriented to person, place, and time. She appears well-developed and well-nourished.  HENT:  Head: Normocephalic and atraumatic.  Eyes: Pupils are equal, round, and reactive to light.  Neck: Normal range of motion.  Cardiovascular: Normal rate, regular rhythm, normal heart sounds and intact distal pulses.  Respiratory: Effort normal and breath sounds normal.  GI: Soft. Bowel sounds are normal.  Musculoskeletal: Normal range of motion.  Neurological: She is alert and oriented to person, place, and time. She has normal reflexes.  Skin: Skin is warm and dry.  Psychiatric: She has a normal mood and affect. Her behavior is normal. Judgment and thought content normal.    MAU Course  Procedures  MDM CCUA CBC CMP P/C Ratio Serial BP's   Results for orders placed or performed during the hospital encounter of 01/20/19 (from the past 24 hour(s))  CBC     Status: None   Collection Time: 01/20/19  5:23 PM  Result Value Ref Range   WBC 7.1 4.0 - 10.5 K/uL   RBC 4.16 3.87 - 5.11 MIL/uL   Hemoglobin 12.6 12.0 - 15.0 g/dL   HCT 16.139.3 09.636.0 - 04.546.0 %   MCV 94.5 80.0 - 100.0 fL   MCH 30.3 26.0 - 34.0 pg   MCHC 32.1 30.0 - 36.0 g/dL   RDW 40.913.7 81.111.5 - 91.415.5 %   Platelets 392 150 - 400 K/uL   nRBC 0.0 0.0 - 0.2 %  Comprehensive metabolic panel     Status: Abnormal   Collection Time: 01/20/19  5:23 PM  Result Value Ref Range   Sodium 139 135 - 145 mmol/L   Potassium 3.8 3.5 - 5.1 mmol/L   Chloride 104 98 - 111 mmol/L   CO2 26 22 - 32 mmol/L   Glucose, Bld 65 (L) 70 - 99 mg/dL   BUN <5 (L) 6 - 20 mg/dL   Creatinine, Ser 7.820.85 0.44 - 1.00 mg/dL   Calcium 9.2 8.9 - 95.610.3 mg/dL   Total Protein 7.3 6.5 - 8.1 g/dL   Albumin 3.5 3.5 - 5.0 g/dL   AST 16 15 - 41 U/L   ALT 16 0 - 44 U/L   Alkaline Phosphatase 116 38 - 126 U/L   Total Bilirubin 0.9 0.3 - 1.2 mg/dL  GFR calc non Af Amer >60 >60 mL/min   GFR calc Af Amer >60 >60 mL/min    Anion gap 9 5 - 15  Protein / creatinine ratio, urine     Status: None   Collection Time: 01/20/19  5:24 PM  Result Value Ref Range   Creatinine, Urine 90.28 mg/dL   Total Protein, Urine 7 mg/dL   Protein Creatinine Ratio 0.08 0.00 - 0.15 mg/mg[Cre]     Report given to and Care assumed by Jorje Guild, FNP @ [address] MSN, CNM 01/20/2019, 5:52 PM    Headache greatly improved after IV headache cocktail (8>1).  PEC labs negative Had some severe range BPs treated with oral procardia & IV labetalol.  C/w Dr. Hulan Fray. Will d/c home on procardia 30 xl daily with good return precautions.  Assessment and Plan  A: 1. Postpartum hypertension   2. Acute nonintractable headache, unspecified headache type    P: Discharge home Rx procardia Discussed reasons to return to MAU & PEC precautions Msg to [facility] for BP check in 1 week  Jorje Guild, NP

## 2019-01-23 NOTE — Progress Notes (Signed)
I have reviewed this chart and agree with the RN/CMA assessment and management.    Matty Deamer C Laiah Pouncey, MD, FACOG Attending Physician, [facility]  

## 2019-01-30 ENCOUNTER — Ambulatory Visit (INDEPENDENT_AMBULATORY_CARE_PROVIDER_SITE_OTHER): Payer: Medicaid Other | Admitting: General Practice

## 2019-01-30 ENCOUNTER — Other Ambulatory Visit: Payer: Self-pay

## 2019-01-30 VITALS — BP 123/81 | HR 63 | Ht 60.0 in | Wt 119.0 lb

## 2019-01-30 DIAGNOSIS — Z0130 Encounter for examination of blood pressure without abnormal findings: Secondary | ICD-10-CM

## 2019-01-30 NOTE — Progress Notes (Signed)
Patient presents to office today for BP check following vaginal delivery 7/27. Hx significant for gestational HTN. Patient went to MAU on 8/7 for elevated BP at home and dizziness & was started on Procardia 30mg . Patient reports taking medication daily in the morning though she hasn't had a chance to take it yet this morning. She reports occasional mild headaches and seeing spots but much less than before. BP 123/81 today. Patient will continue to monitor at home and will follow up on 8/25 for pp visit.   Koren Bound RN BSN 01/30/19

## 2019-01-30 NOTE — Progress Notes (Signed)
Patient seen and assessed by nursing staff during this encounter. I have reviewed the chart and agree with the documentation and plan.  Verita Schneiders, MD 01/30/2019 1:35 PM

## 2019-02-06 ENCOUNTER — Telehealth: Payer: Self-pay | Admitting: Advanced Practice Midwife

## 2019-02-06 NOTE — Telephone Encounter (Signed)
Attempted to call patient about her appointment on 8/25 @ 9:15. No answer, and could not leave a voicemail because it was not set-up.

## 2019-02-07 ENCOUNTER — Encounter: Payer: Self-pay | Admitting: Advanced Practice Midwife

## 2019-02-07 ENCOUNTER — Other Ambulatory Visit: Payer: Self-pay

## 2019-02-07 ENCOUNTER — Telehealth (INDEPENDENT_AMBULATORY_CARE_PROVIDER_SITE_OTHER): Payer: Medicaid Other | Admitting: Advanced Practice Midwife

## 2019-02-07 VITALS — BP 116/73

## 2019-02-07 DIAGNOSIS — Z30011 Encounter for initial prescription of contraceptive pills: Secondary | ICD-10-CM

## 2019-02-07 MED ORDER — NORGESTIMATE-ETH ESTRADIOL 0.25-35 MG-MCG PO TABS: 1.0000 | ORAL_TABLET | Freq: Every day | ORAL | 12 refills | Status: DC

## 2019-02-07 NOTE — Patient Instructions (Signed)

## 2019-02-07 NOTE — Progress Notes (Signed)
TELEHEALTH POSTPARTUM VIRTUAL VIDEO VISIT ENCOUNTER NOTE   [facility]   I connected with Elizabeth Shaffer on 02/07/19 at  9:15 AM EDT by MyChart Video Encounter at home and verified that I am speaking with the correct person using two identifiers.    I discussed the limitations, risks, security and privacy concerns of performing an evaluation and management service virtually and the availability of in person appointments. I also discussed with the patient that there may be a patient responsible charge related to this service. The patient expressed understanding and agreed to proceed.  Chief Complaint: Postpartum Visit  History of Present Illness: Elizabeth Shaffer is a 23 y.o. African-American G1P1001 being evaluated for postpartum followup.    She is s/p normal spontaneous vaginal delivery on 01/09/2019 at 3078w2d; she was discharged to home on 01/11/2019. Pregnancy complicated by GHTN. Baby is doing well.  Complains of n/a   Vaginal bleeding or discharge: No  Intercourse: No  Contraception: oral contraceptives (estrogen/progesterone) Mode of feeding infant: Bottle PP depression s/s: No .  Any bowel or bladder issues: No  Pap smear: atypical squamous cellularity of undetermined significance (ASCUS) (date:08/02/2018)  Review of Systems: Positive for nothing. Her 12 point review of systems is negative or as noted in the History of Present Illness.  Patient Active Problem List   Diagnosis Date Noted  . Vaginal delivery 01/10/2019  . PROM (premature rupture of membranes) 01/09/2019  . Gestational hypertension 01/09/2019  . Preterm uterine contractions 12/03/2018  . Anemia affecting pregnancy in third trimester 11/02/2018  . Supervision of low-risk first pregnancy 07/20/2018  . GERD (gastroesophageal reflux disease) 07/20/2018    Medications Elizabeth Shaffer had no medications administered during this visit. Current Outpatient Medications   Medication Sig Dispense Refill  . acetaminophen (TYLENOL) 500 MG tablet Take 1,000 mg by mouth every 6 (six) hours as needed for mild pain or headache.     . cyclobenzaprine (FLEXERIL) 5 MG tablet Take 1 tablet (5 mg total) by mouth 3 (three) times daily as needed for muscle spasms. 20 tablet 0  . famotidine (PEPCID) 20 MG tablet Take 1 tablet (20 mg total) by mouth 2 (two) times daily. 60 tablet 0  . ferrous sulfate 325 (65 FE) MG tablet Take 1 tablet (325 mg total) by mouth 2 (two) times daily with a meal. 60 tablet 3  . ibuprofen (ADVIL) 600 MG tablet Take 1 tablet (600 mg total) by mouth every 6 (six) hours. 30 tablet 0  . NIFEdipine (PROCARDIA-XL/NIFEDICAL-XL) 30 MG 24 hr tablet Take 1 tablet (30 mg total) by mouth daily. 30 tablet 0  . ondansetron (ZOFRAN-ODT) 8 MG disintegrating tablet DISSOLVE 1 TABLET(8 MG) ON THE TONGUE EVERY 8 HOURS AS NEEDED FOR NAUSEA OR VOMITING 20 tablet 2  . Prenatal Vit-Fe Fumarate-FA (MULTIVITAMIN-PRENATAL) 27-0.8 MG TABS tablet Take 1 tablet by mouth daily at 12 noon.    . senna-docusate (SENOKOT-S) 8.6-50 MG tablet Take 2 tablets by mouth daily. 20 tablet 0  . zolpidem (AMBIEN) 5 MG tablet Take 1 tablet (5 mg total) by mouth at bedtime as needed for sleep. 5 tablet 0   No current facility-administered medications for this visit.     Allergies Patient has no known allergies.  Physical Exam:  116/73 Reviewed from Babyscripts General:  Alert, oriented and cooperative. Patient is in no acute distress.  Mental Status: Normal mood and affect. Normal behavior. Normal judgment and thought content.   Respiratory: Normal respiratory  effort noted, no problems with respiration noted  Rest of physical exam deferred due to type of encounter  PP Depression Screening:   Edinburgh Postnatal Depression Scale Screening Tool 01/10/2019  I have been able to laugh and see the funny side of things. 0  I have looked forward with enjoyment to things. 0  I have blamed myself  unnecessarily when things went wrong. 0  I have been anxious or worried for no good reason. 0  I have felt scared or panicky for no good reason. 2  Things have been getting on top of me. 0  I have been so unhappy that I have had difficulty sleeping. 0  I have felt sad or miserable. 0  I have been so unhappy that I have been crying. 0  The thought of harming myself has occurred to me. 0  Edinburgh Postnatal Depression Scale Total 2     Assessment:Patient is a 23 y.o. G1P1001 who is 4 weeks postpartum from a normal spontaneous vaginal delivery.  She is doing well.   1. Oral contraception initial prescription  - norgestimate-ethinyl estradiol (ORTHO-CYCLEN) 0.25-35 MG-MCG tablet; Take 1 tablet by mouth daily.  Dispense: 1 Package; Refill: 12  2. Postpartum Care and examination  Plan: Pap due 07/2019 There are no diagnoses linked to this encounter.  RTC 07/2019 for Pap  I discussed the assessment and treatment plan with the patient. The patient was provided an opportunity to ask questions and all were answered. The patient agreed with the plan and demonstrated an understanding of the instructions.   The patient was advised to call back or seek an in-person evaluation/go to the ED for any concerning postpartum symptoms.  I provided 10 minutes of face-to-face time during this encounter.   Tamala Julian, [place], [place] for [employer], [place]

## 2019-02-13 ENCOUNTER — Other Ambulatory Visit: Payer: Self-pay

## 2019-02-13 ENCOUNTER — Inpatient Hospital Stay (HOSPITAL_COMMUNITY)
Admission: AD | Admit: 2019-02-13 | Discharge: 2019-02-13 | Disposition: A | Discharge status: Home / Self Care | Payer: Medicaid Other | Attending: Family Medicine | Admitting: Family Medicine

## 2019-02-13 ENCOUNTER — Encounter (HOSPITAL_COMMUNITY): Payer: Self-pay

## 2019-02-13 ENCOUNTER — Inpatient Hospital Stay (HOSPITAL_COMMUNITY): Payer: Medicaid Other

## 2019-02-13 DIAGNOSIS — O9089 Other complications of the puerperium, not elsewhere classified: Secondary | ICD-10-CM | POA: Diagnosis not present

## 2019-02-13 DIAGNOSIS — R109 Unspecified abdominal pain: Secondary | ICD-10-CM | POA: Diagnosis not present

## 2019-02-13 DIAGNOSIS — R112 Nausea with vomiting, unspecified: Secondary | ICD-10-CM | POA: Insufficient documentation

## 2019-02-13 DIAGNOSIS — K219 Gastro-esophageal reflux disease without esophagitis: Secondary | ICD-10-CM | POA: Insufficient documentation

## 2019-02-13 DIAGNOSIS — Z8719 Personal history of other diseases of the digestive system: Secondary | ICD-10-CM

## 2019-02-13 DIAGNOSIS — O909 Complication of the puerperium, unspecified: Secondary | ICD-10-CM | POA: Diagnosis not present

## 2019-02-13 DIAGNOSIS — Z79899 Other long term (current) drug therapy: Secondary | ICD-10-CM | POA: Insufficient documentation

## 2019-02-13 DIAGNOSIS — K2970 Gastritis, unspecified, without bleeding: Secondary | ICD-10-CM | POA: Diagnosis not present

## 2019-02-13 LAB — COMPREHENSIVE METABOLIC PANEL
ALT: 17 U/L (ref 0–44)
AST: 14 U/L — ABNORMAL LOW (ref 15–41)
Albumin: 4.1 g/dL (ref 3.5–5.0)
Alkaline Phosphatase: 92 U/L (ref 38–126)
Anion gap: 10 (ref 5–15)
BUN: 9 mg/dL (ref 6–20)
CO2: 23 mmol/L (ref 22–32)
Calcium: 9.4 mg/dL (ref 8.9–10.3)
Chloride: 104 mmol/L (ref 98–111)
Creatinine, Ser: 0.89 mg/dL (ref 0.44–1.00)
GFR calc Af Amer: >60 mL/min (ref >60)
GFR calc non Af Amer: >60 mL/min (ref >60)
Glucose, Bld: 94 mg/dL (ref 70–99)
Potassium: 3.8 mmol/L (ref 3.5–5.1)
Sodium: 137 mmol/L (ref 135–145)
Total Bilirubin: 1.7 mg/dL — ABNORMAL HIGH (ref 0.3–1.2)
Total Protein: 7.4 g/dL (ref 6.5–8.1)

## 2019-02-13 LAB — URINALYSIS, ROUTINE W REFLEX MICROSCOPIC
Bilirubin Urine: NEGATIVE
Glucose, UA: NEGATIVE mg/dL
Hgb urine dipstick: NEGATIVE
Ketones, ur: 20 mg/dL — AB
Nitrite: NEGATIVE
Protein, ur: NEGATIVE mg/dL
Specific Gravity, Urine: 1.023 (ref 1.005–1.030)
pH: 6 (ref 5.0–8.0)

## 2019-02-13 LAB — POCT PREGNANCY, URINE: Preg Test, Ur: NEGATIVE

## 2019-02-13 LAB — CBC WITH DIFFERENTIAL/PLATELET
Abs Immature Granulocytes: 0.02 10*3/uL (ref 0.00–0.07)
Basophils Absolute: 0 10*3/uL (ref 0.0–0.1)
Basophils Relative: 0 %
Eosinophils Absolute: 0 10*3/uL (ref 0.0–0.5)
Eosinophils Relative: 0 %
HCT: 40.3 % (ref 36.0–46.0)
Hemoglobin: 13.5 g/dL (ref 12.0–15.0)
Immature Granulocytes: 0 %
Lymphocytes Relative: 23 %
Lymphs Abs: 1.5 10*3/uL (ref 0.7–4.0)
MCH: 30.5 pg (ref 26.0–34.0)
MCHC: 33.5 g/dL (ref 30.0–36.0)
MCV: 91 fL (ref 80.0–100.0)
Monocytes Absolute: 0.5 10*3/uL (ref 0.1–1.0)
Monocytes Relative: 8 %
Neutro Abs: 4.5 10*3/uL (ref 1.7–7.7)
Neutrophils Relative %: 69 %
Platelets: 225 10*3/uL (ref 150–400)
RBC: 4.43 MIL/uL (ref 3.87–5.11)
RDW: 13.1 % (ref 11.5–15.5)
WBC: 6.6 10*3/uL (ref 4.0–10.5)
nRBC: 0 % (ref 0.0–0.2)

## 2019-02-13 LAB — LIPASE, BLOOD: Lipase: 23 U/L (ref 11–51)

## 2019-02-13 MED ORDER — ONDANSETRON 8 MG PO TBDP: ORAL_TABLET | ORAL | 0 refills | Status: DC

## 2019-02-13 MED ORDER — OMEPRAZOLE 10 MG PO CPDR: 20.0000 mg | DELAYED_RELEASE_CAPSULE | Freq: Every day | ORAL | 1 refills | Status: DC

## 2019-02-13 NOTE — MAU Provider Note (Signed)
History     CSN: [id number]  Arrival date and time: 02/13/19 1647   First Provider Initiated Contact with Patient 02/13/19 1721      Chief Complaint  Patient presents with  . Abdominal Cramping   HPI   [name] is a 23 y.o. female G1P1001 postpartum s/p SVD on 7/28 here with complaints of abdominal pain x 2 weeks. She has a history of gastritis that was diagnosed by her PCP prior to pregnancy. At one point she was seeing a GI MD.  She felt fine for 2 weeks after she had her baby then she started having contraction like pain in the middle of abdomen near her umblicus. She pain worsened today and she started having nausea and vomiting. No fever. She has stopped spotting since her delivery.   OB History    Gravida  1   Para  1   Term  1   Preterm  0   AB  0   Living  1     SAB  0   TAB  0   Ectopic  0   Multiple  0   Live Births  1           Past Medical History:  Diagnosis Date  . GERD (gastroesophageal reflux disease)   . Headache     Past Surgical History:  Procedure Laterality Date  . MOUTH SURGERY  2011  . MULTIPLE TOOTH EXTRACTIONS    . WISDOM TOOTH EXTRACTION      Family History  Problem Relation Age of Onset  . Hypertension Mother     Social History   Tobacco Use  . Smoking status: Never Smoker  . Smokeless tobacco: Never Used  Substance Use Topics  . Alcohol use: Not Currently    Comment: on occ  . Drug use: Not Currently    Types: Marijuana    Comment: none since November 2019    Allergies: No Known Allergies  Medications Prior to Admission  Medication Sig Dispense Refill Last Dose  . acetaminophen (TYLENOL) 500 MG tablet Take 1,000 mg by mouth every 6 (six) hours as needed for mild pain or headache.      . cyclobenzaprine (FLEXERIL) 5 MG tablet Take 1 tablet (5 mg total) by mouth 3 (three) times daily as needed for muscle spasms. (Patient not taking: Reported on 02/07/2019) 20 tablet 0   . famotidine (PEPCID) 20 MG  tablet Take 1 tablet (20 mg total) by mouth 2 (two) times daily. (Patient not taking: Reported on 02/07/2019) 60 tablet 0   . ferrous sulfate 325 (65 FE) MG tablet Take 1 tablet (325 mg total) by mouth 2 (two) times daily with a meal. 60 tablet 3   . ibuprofen (ADVIL) 600 MG tablet Take 1 tablet (600 mg total) by mouth every 6 (six) hours. 30 tablet 0   . NIFEdipine (PROCARDIA-XL/NIFEDICAL-XL) 30 MG 24 hr tablet Take 1 tablet (30 mg total) by mouth daily. 30 tablet 0   . norgestimate-ethinyl estradiol (ORTHO-CYCLEN) 0.25-35 MG-MCG tablet Take 1 tablet by mouth daily. 1 Package 12   . ondansetron (ZOFRAN-ODT) 8 MG disintegrating tablet DISSOLVE 1 TABLET(8 MG) ON THE TONGUE EVERY 8 HOURS AS NEEDED FOR NAUSEA OR VOMITING 20 tablet 2   . Prenatal Vit-Fe Fumarate-FA (MULTIVITAMIN-PRENATAL) 27-0.8 MG TABS tablet Take 1 tablet by mouth daily at 12 noon.     . senna-docusate (SENOKOT-S) 8.6-50 MG tablet Take 2 tablets by mouth daily. 20 tablet 0   . zolpidem (  AMBIEN) 5 MG tablet Take 1 tablet (5 mg total) by mouth at bedtime as needed for sleep. 5 tablet 0    Results for orders placed or performed during the hospital encounter of 02/13/19 (from the past 48 hour(s))  Pregnancy, urine POC     Status: None   Collection Time: 02/13/19  5:13 PM  Result Value Ref Range   Preg Test, Ur NEGATIVE NEGATIVE    Comment:        THE SENSITIVITY OF THIS METHODOLOGY IS >24 mIU/mL   CBC with Differential/Platelet     Status: None   Collection Time: 02/13/19  5:49 PM  Result Value Ref Range   WBC 6.6 4.0 - 10.5 K/uL   RBC 4.43 3.87 - 5.11 MIL/uL   Hemoglobin 13.5 12.0 - 15.0 g/dL   HCT 16.140.3 09.636.0 - 04.546.0 %   MCV 91.0 80.0 - 100.0 fL   MCH 30.5 26.0 - 34.0 pg   MCHC 33.5 30.0 - 36.0 g/dL   RDW 40.913.1 81.111.5 - 91.415.5 %   Platelets 225 150 - 400 K/uL   nRBC 0.0 0.0 - 0.2 %   Neutrophils Relative % 69 %   Neutro Abs 4.5 1.7 - 7.7 K/uL   Lymphocytes Relative 23 %   Lymphs Abs 1.5 0.7 - 4.0 K/uL   Monocytes Relative 8 %    Monocytes Absolute 0.5 0.1 - 1.0 K/uL   Eosinophils Relative 0 %   Eosinophils Absolute 0.0 0.0 - 0.5 K/uL   Basophils Relative 0 %   Basophils Absolute 0.0 0.0 - 0.1 K/uL   Immature Granulocytes 0 %   Abs Immature Granulocytes 0.02 0.00 - 0.07 K/uL    Comment: Performed at [facility], [address] [place], [place] [address]  Comprehensive metabolic panel     Status: Abnormal   Collection Time: 02/13/19  5:49 PM  Result Value Ref Range   Sodium 137 135 - 145 mmol/L   Potassium 3.8 3.5 - 5.1 mmol/L   Chloride 104 98 - 111 mmol/L   CO2 23 22 - 32 mmol/L   Glucose, Bld 94 70 - 99 mg/dL   BUN 9 6 - 20 mg/dL   Creatinine, Ser 6.210.89 0.44 - 1.00 mg/dL   Calcium 9.4 8.9 - 30.810.3 mg/dL   Total Protein 7.4 6.5 - 8.1 g/dL   Albumin 4.1 3.5 - 5.0 g/dL   AST 14 (L) 15 - 41 U/L   ALT 17 0 - 44 U/L   Alkaline Phosphatase 92 38 - 126 U/L   Total Bilirubin 1.7 (H) 0.3 - 1.2 mg/dL   GFR calc non Af Amer >60 >60 mL/min   GFR calc Af Amer >60 >60 mL/min   Anion gap 10 5 - 15    Comment: Performed at [facility], [address] [place], [place] [address]  Lipase, blood     Status: None   Collection Time: 02/13/19  5:49 PM  Result Value Ref Range   Lipase 23 11 - 51 U/L    Comment: Performed at [facility], [address] [place], [place] [address]  Urinalysis, Routine w reflex microscopic     Status: Abnormal   Collection Time: 02/13/19  5:57 PM  Result Value Ref Range   Color, Urine YELLOW YELLOW   APPearance CLEAR CLEAR   Specific Gravity, Urine 1.023 1.005 - 1.030   pH 6.0 5.0 - 8.0   Glucose, UA NEGATIVE NEGATIVE mg/dL   Hgb urine  dipstick NEGATIVE NEGATIVE   Bilirubin Urine NEGATIVE NEGATIVE   Ketones, ur 20 (A) NEGATIVE mg/dL   Protein, ur NEGATIVE NEGATIVE mg/dL   Nitrite NEGATIVE NEGATIVE   Leukocytes,Ua TRACE (A) NEGATIVE   RBC / HPF 0-5 0 - 5 RBC/hpf   WBC, UA 0-5 0 - 5 WBC/hpf   Bacteria, UA RARE (A) NONE SEEN   Squamous Epithelial / LPF 0-5 0 - 5    Mucus PRESENT     Comment: Performed at [facility], [place] [address], [place], [place] [address]   US Pelvic Complete With Transvaginal  Result Date: 02/13/2019 CLINICAL DATA:  Initial evaluation for acute abdominal pain. EXAM: TRANSABDOMINAL AND TRANSVAGINAL ULTRASOUND OF PELVIS TECHNIQUE: Both transabdominal and transvaginal ultrasound examinations of the pelvis were performed. Transabdominal technique was performed for global imaging of the pelvis including uterus, ovaries, adnexal regions, and pelvic cul-de-sac. It was necessary to proceed with endovaginal exam following the transabdominal exam to visualize the uterus, endometrium, and ovaries. COMPARISON:  None FINDINGS: Uterus Measurements: 8.7 x 5.4 x 5.4 cm = volume: 130.8 mL. No fibroids or other mass visualized. Endometrium Thickness: 11.0 mm. No focal abnormality visualized. Small amount of mildly complex fluid seen within the endocervical canal. Right ovary Measurements: 2.8 x 1.8 x 1.9 cm = volume: 5.1 mL. Normal appearance/no adnexal mass. Left ovary Measurements: 2.6 x 1.7 x 1.8 cm = volume: 4.2 mL. Normal appearance/no adnexal mass. Other findings No abnormal free fluid. IMPRESSION: Normal pelvic ultrasound.  No acute abnormality identified. Electronically Signed   By: Jeannine Boga M.D.   On: 02/13/2019 18:39    Review of Systems  Constitutional: Negative for fever.  Gastrointestinal: Positive for abdominal pain, constipation, nausea and vomiting.  Genitourinary: Negative for vaginal bleeding.   Physical Exam   Blood pressure 112/78, pulse 61, temperature 98.1 F (36.7 C), temperature source Oral, resp. rate 18, SpO2 100 %, unknown if currently breastfeeding.  Physical Exam  Constitutional: She appears well-developed and well-nourished. No distress.  HENT:  Head: Normocephalic.  Respiratory: Effort normal and breath sounds normal.  GI: Soft. Normal appearance. There is no hepatosplenomegaly. There is abdominal  tenderness in the periumbilical area. There is no rebound and no CVA tenderness.    Genitourinary:    Genitourinary Comments: Bimanual exam: Cervix closed, posterior  Uterus non tender, normal size Adnexa non tender, no masses bilaterally Chaperone present for exam.    Skin: She is not diaphoretic.   MAU Course  Procedures  None  MDM  UA CBC & CMP & Lipase  Normal WBC, no rebound tenderness  Patient with no pain on cervical exam. Pelvic US and blood work grossly normal today, no acute findings. She does have a history of gastritis and does have a GI MD and PCP. Discussed patient with Dr. Rip Harbour.   Assessment and Plan   A:  1. Abdominal pain   2. Postpartum complication   3. Hx of gastritis     P:  Discharge home in stable condition If symptoms worsen go to [facility] Rx: Omeprazole, zofran  Avoid spicy foods F/u with PCP and GI MD  Aran Menning, Artist Pais, NP 02/13/2019 8:07 PM

## 2019-02-13 NOTE — MAU Note (Addendum)
Pt is s/p vaginal delivery on 7/27.  For the last week she has been having abdominal cramping.  Today starting around 1400 it became worse and began vomiting due to the pain.  Pt states that pain also corresponded with having a BM.   Pt states she also has a 9/10 HA.  No VB, no fever or signs of infection.

## 2019-02-13 NOTE — Discharge Instructions (Signed)
Gastritis, Adult ° °Gastritis is swelling (inflammation) of the stomach. Gastritis can develop quickly (acute). It can also develop slowly over time (chronic). It is important to get help for this condition. If you do not get help, your stomach can bleed, and you can get sores (ulcers) in your stomach. °What are the causes? °This condition may be caused by: °· Germs that get to your stomach. °· Drinking too much alcohol. °· Medicines you are taking. °· Too much acid in the stomach. °· A disease of the intestines or stomach. °· Stress. °· An allergic reaction. °· Crohn's disease. °· Some cancer treatments (radiation). °Sometimes the cause of this condition is not known. °What are the signs or symptoms? °Symptoms of this condition include: °· Pain in your stomach. °· A burning feeling in your stomach. °· Feeling sick to your stomach (nauseous). °· Throwing up (vomiting). °· Feeling too full after you eat. °· Weight loss. °· Bad breath. °· Throwing up blood. °· Blood in your poop (stool). °How is this diagnosed? °This condition may be diagnosed with: °· Your medical history and symptoms. °· A physical exam. °· Tests. These can include: °? Blood tests. °? Stool tests. °? A procedure to look inside your stomach (upper endoscopy). °? A test in which a sample of tissue is taken for testing (biopsy). °How is this treated? °Treatment for this condition depends on what caused it. You may be given: °· Antibiotic medicine, if your condition was caused by germs. °· H2 blockers and similar medicines, if your condition was caused by too much acid. °Follow these instructions at home: °Medicines °· Take over-the-counter and prescription medicines only as told by your doctor. °· If you were prescribed an antibiotic medicine, take it as told by your doctor. Do not stop taking it even if you start to feel better. °Eating and drinking ° °· Eat small meals often, instead of large meals. °· Avoid foods and drinks that make your symptoms  worse. °· Drink enough fluid to keep your pee (urine) pale yellow. °Alcohol use °· Do not drink alcohol if: °? Your doctor tells you not to drink. °? You are pregnant, may be pregnant, or are planning to become pregnant. °· If you drink alcohol: °? Limit your use to: °§ 0-1 drink a day for women. °§ 0-2 drinks a day for men. °? Be aware of how much alcohol is in your drink. In the [place] one drink equals one 12 oz bottle of beer (355 mL), one 5 oz glass of wine (148 mL), or one 1½ oz glass of hard liquor (44 mL). °General instructions °· Talk with your doctor about ways to manage stress. You can exercise or do deep breathing, meditation, or yoga. °· Do not smoke or use products that have nicotine or tobacco. If you need help quitting, ask your doctor. °· Keep all follow-up visits as told by your doctor. This is important. °Contact a doctor if: °· Your symptoms get worse. °· Your symptoms go away and then come back. °Get help right away if: °· You throw up blood or something that looks like coffee grounds. °· You have black or dark red poop. °· You throw up any time you try to drink fluids. °· Your stomach pain gets worse. °· You have a fever. °· You do not feel better after one week. °Summary °· Gastritis is swelling (inflammation) of the stomach. °· You must get help for this condition. If you do not get help, your stomach   can bleed, and you can get sores (ulcers). °· This condition is diagnosed with medical history, physical exam, or tests. °· You can be treated with medicines for germs or medicines to block too much acid in your stomach. °This information is not intended to replace advice given to you by your health care provider. Make sure you discuss any questions you have with your health care provider. °Document Released: 11/18/2007 Document Revised: 10/19/2017 Document Reviewed: 10/19/2017 °Elsevier Patient Education © 2020 Elsevier Inc. ° °

## 2019-03-15 DIAGNOSIS — G43109 Migraine with aura, not intractable, without status migrainosus: Secondary | ICD-10-CM | POA: Insufficient documentation

## 2019-04-03 ENCOUNTER — Encounter (HOSPITAL_COMMUNITY): Payer: Self-pay | Admitting: *Deleted

## 2019-04-03 ENCOUNTER — Other Ambulatory Visit: Payer: Self-pay

## 2019-04-03 ENCOUNTER — Emergency Department (HOSPITAL_COMMUNITY)
Admission: EM | Admit: 2019-04-03 | Discharge: 2019-04-03 | Disposition: A | Discharge status: Home / Self Care | Payer: Medicaid Other | Attending: Emergency Medicine | Admitting: Emergency Medicine

## 2019-04-03 DIAGNOSIS — R1084 Generalized abdominal pain: Secondary | ICD-10-CM | POA: Diagnosis not present

## 2019-04-03 DIAGNOSIS — N938 Other specified abnormal uterine and vaginal bleeding: Secondary | ICD-10-CM | POA: Diagnosis not present

## 2019-04-03 DIAGNOSIS — N939 Abnormal uterine and vaginal bleeding, unspecified: Secondary | ICD-10-CM

## 2019-04-03 LAB — I-STAT BETA HCG BLOOD, ED (MC, WL, AP ONLY): I-stat hCG, quantitative: <5 m[IU]/mL (ref <5)

## 2019-04-03 LAB — WET PREP, GENITAL
Clue Cells Wet Prep HPF POC: NONE SEEN
Sperm: NONE SEEN
Trich, Wet Prep: NONE SEEN
Yeast Wet Prep HPF POC: NONE SEEN

## 2019-04-03 NOTE — ED Provider Notes (Signed)
[facility] EMERGENCY DEPARTMENT Provider Note   CSN: [id number] Arrival date & time: 04/03/19  1924     History   Chief Complaint Chief Complaint  Patient presents with  . Vaginal Bleeding    HPI Elizabeth Shaffer is a 23 y.o. female with PMH/o GERD, HA who presents for evaluation of intermittent vaginal bleeding. She reports that she recently was started on birth control in July.  She reports that since then, she has had some irregular periods.  She states she had a period that went from 10/9-10/11.  She reports that it was lighter than normal.  She started bleeding again today and states that she is concerned because the blood seemed darker and she was passing clots.  She states she is used about 4-5 pads a day and states that most of them are fully saturated.  She has had some intermittent abdominal cramping.  No fevers, nausea/vomiting.  She has not noted any vaginal discharge.  No urinary complaints.  She has not followed up with her OB/GYN since starting the birth control.     The history is provided by the patient.    Past Medical History:  Diagnosis Date  . GERD (gastroesophageal reflux disease)   . Headache     Patient Active Problem List   Diagnosis Date Noted  . History of gestational hypertension 01/09/2019  . GERD (gastroesophageal reflux disease) 07/20/2018    Past Surgical History:  Procedure Laterality Date  . MOUTH SURGERY  2011  . MULTIPLE TOOTH EXTRACTIONS    . WISDOM TOOTH EXTRACTION       OB History    Gravida  1   Para  1   Term  1   Preterm  0   AB  0   Living  1     SAB  0   TAB  0   Ectopic  0   Multiple  0   Live Births  1            Home Medications    Prior to Admission medications   Medication Sig Start Date End Date Taking? Authorizing Provider  acetaminophen (TYLENOL) 500 MG tablet Take 1,000 mg by mouth every 6 (six) hours as needed for mild pain or headache.     [provider]   cyclobenzaprine (FLEXERIL) 5 MG tablet Take 1 tablet (5 mg total) by mouth 3 (three) times daily as needed for muscle spasms. Patient not taking: Reported on 02/07/2019 12/28/18   Aviva Signs, CNM  ibuprofen (ADVIL) 600 MG tablet Take 1 tablet (600 mg total) by mouth every 6 (six) hours. 01/11/19   Arvilla Market, DO  norgestimate-ethinyl estradiol (ORTHO-CYCLEN) 0.25-35 MG-MCG tablet Take 1 tablet by mouth daily. 02/07/19   Katrinka Blazing, [place], CNM  omeprazole (PRILOSEC) 10 MG capsule Take 2 capsules (20 mg total) by mouth daily. 02/13/19 02/13/20  Rasch, Victorino Dike I, NP  ondansetron (ZOFRAN-ODT) 8 MG disintegrating tablet DISSOLVE 1 TABLET(8 MG) ON THE TONGUE EVERY 8 HOURS AS NEEDED FOR NAUSEA OR VOMITING 02/13/19   Rasch, Victorino Dike I, NP  Prenatal Vit-Fe Fumarate-FA (MULTIVITAMIN-PRENATAL) 27-0.8 MG TABS tablet Take 1 tablet by mouth daily at 12 noon.    [provider]  senna-docusate (SENOKOT-S) 8.6-50 MG tablet Take 2 tablets by mouth daily. 01/12/19   Arvilla Market, DO    Family History Family History  Problem Relation Age of Onset  . Hypertension Mother     Social History Social History  Tobacco Use  . Smoking status: Never Smoker  . Smokeless tobacco: Never Used  Substance Use Topics  . Alcohol use: Not Currently    Comment: on occ  . Drug use: Not Currently    Types: Marijuana    Comment: none since November 2019     Allergies   Patient has no known allergies.   Review of Systems Review of Systems  Constitutional: Negative for fever.  Respiratory: Negative for shortness of breath.   Gastrointestinal: Negative for abdominal pain, nausea and vomiting.  Genitourinary: Positive for vaginal bleeding. Negative for dysuria and hematuria.  All other systems reviewed and are negative.    Physical Exam Updated Vital Signs BP 109/74 (BP Location: Left Arm)   Pulse (!) 57   Temp 97.7 F (36.5 C) (Oral)   Resp 17   LMP 04/02/2019   SpO2 94%    Physical Exam Vitals signs and nursing note reviewed. Exam conducted with a chaperone present.  Constitutional:      Appearance: Normal appearance. She is well-developed.  HENT:     Head: Normocephalic and atraumatic.  Eyes:     General: Lids are normal.     Conjunctiva/sclera: Conjunctivae normal.     Pupils: Pupils are equal, round, and reactive to light.  Neck:     Musculoskeletal: Full passive range of motion without pain.  Cardiovascular:     Rate and Rhythm: Normal rate and regular rhythm.     Pulses: Normal pulses.     Heart sounds: Normal heart sounds. No murmur. No friction rub. No gallop.   Pulmonary:     Effort: Pulmonary effort is normal.     Breath sounds: Normal breath sounds.  Abdominal:     Palpations: Abdomen is soft. Abdomen is not rigid.     Tenderness: There is no abdominal tenderness. There is no guarding.     Comments: Abdomen is soft, non-distended, non-tender. No rigidity, No guarding. No peritoneal signs.  Genitourinary:    Vagina: Normal.     Cervix: Cervical bleeding present. No cervical motion tenderness.     Adnexa:        Right: No mass or tenderness.         Left: No mass or tenderness.       Comments: The exam was performed with a chaperone present. Normal external female genitalia. No lesions, rash, or sores. Cervical OS is closed. There is mild bleeding with bright red blood. No hemorrhage. No clots noted. No CMT. No adnexal mass or tenderness noted bilaterally.  Musculoskeletal: Normal range of motion.  Skin:    General: Skin is warm and dry.     Capillary Refill: Capillary refill takes less than 2 seconds.  Neurological:     Mental Status: She is alert and oriented to person, place, and time.  Psychiatric:        Speech: Speech normal.      ED Treatments / Results  Labs (all labs ordered are listed, but only abnormal results are displayed) Labs Reviewed  WET PREP, GENITAL - Abnormal; Notable for the following components:       Result Value   WBC, Wet Prep HPF POC MODERATE (*)    All other components within normal limits  I-STAT BETA HCG BLOOD, ED ([facility], [facility], [facility] ONLY)  GC/CHLAMYDIA PROBE AMP ([place]) NOT AT [facility]    EKG None  Radiology No results found.  Procedures Procedures (including critical care time)  Medications Ordered in ED Medications - No  data to display   Initial Impression / Assessment and Plan / ED Course  I have reviewed the triage vital signs and the nursing notes.  Pertinent labs & imaging results that were available during my care of the patient were reviewed by me and considered in my medical decision making (see chart for details).        23 y.o. F who presents for evaluation of vaginal bleeding.  She reports that since her birth control, she has had irregular periods.  She reports that today, she started bleeding after having a period from 10/9-10/11.  She was concerned because the blood appeared darker.  She has some intermittent abdominal cramping.  No fevers, nausea/vomiting. Patient is afebrile, non-toxic appearing, sitting comfortably on examination table. Vital signs reviewed and stable. Abdomen soft, nondistended.  No tenderness palpation noted.  Plan for pregnancy test, pelvic.  Pregnancy test is negative.  Pelvic exam shows cervical os is closed.  She does have bleeding consistent with menstrual cycle.  No hemorrhaging.  No clots identified.  No CMT that would be concerning for PID.  No adnexal mass or tenderness noted bilaterally.  No indication for pelvic ultrasound.  Wet prep shows no clue cells.   At this time, suspect this is most likely abnormal uterine bleeding.  Patient's history/physical exam not concerning for ectopic pregnancy, ovarian etiology.  Discussed with patient that she will need follow-up with her OB/GYN.  Encouraged at home supportive care measures. At this time, patient exhibits no emergent life-threatening condition that require further evaluation in  ED or admission. Patient had ample opportunity for questions and discussion. All patient's questions were answered with full understanding. Strict return precautions discussed. Patient expresses understanding and agreement to plan.   Portions of this note were generated with Scientist, clinical (histocompatibility and immunogenetics)Dragon dictation software. Dictation errors may occur despite best attempts at proofreading.   Final Clinical Impressions(s) / ED Diagnoses   Final diagnoses:  Abnormal uterine bleeding    ED Discharge Orders    None       Maxwell CaulLayden, Murial Beam A, PA-C 04/03/19 2248    Milagros Lollykstra, Richard S, MD 04/03/19 2351

## 2019-04-03 NOTE — Discharge Instructions (Signed)
As we discussed this could be related to your birth control pills. You will need to follow up with your OB/GYN  doctor.   You have a gonorrhea chlamydia test pending.  If the results are positive, you will be notified.  You can take Tylenol or Ibuprofen as directed for pain. You can alternate Tylenol and Ibuprofen every 4 hours. If you take Tylenol at 1pm, then you can take Ibuprofen at 5pm. Then you can take Tylenol again at 9pm.   Return to the Emergency Dept for any worsening bleeding, passing out, vomiting or any other worsening or concerning symptoms.

## 2019-04-03 NOTE — ED Triage Notes (Signed)
Pt gave birth in July, was put on birth control pills. Since August, pt has had her period every two weeks. Today noticed her usual pink discharge is black with clots in the commode. Reports lower abd period similar to menstrual cramps

## 2019-04-05 LAB — GC/CHLAMYDIA PROBE AMP (~~LOC~~) NOT AT ARMC
Chlamydia: NEGATIVE
Neisseria Gonorrhea: NEGATIVE

## 2019-04-12 ENCOUNTER — Encounter: Payer: Self-pay | Admitting: Physician Assistant

## 2019-04-20 ENCOUNTER — Ambulatory Visit (INDEPENDENT_AMBULATORY_CARE_PROVIDER_SITE_OTHER): Payer: Medicaid Other | Admitting: Physician Assistant

## 2019-04-20 ENCOUNTER — Encounter: Payer: Self-pay | Admitting: Physician Assistant

## 2019-04-20 VITALS — BP 110/56 | HR 60 | Temp 98.4°F | Ht 60.5 in | Wt 121.0 lb

## 2019-04-20 DIAGNOSIS — R519 Headache, unspecified: Secondary | ICD-10-CM

## 2019-04-20 DIAGNOSIS — R1010 Upper abdominal pain, unspecified: Secondary | ICD-10-CM

## 2019-04-20 DIAGNOSIS — K219 Gastro-esophageal reflux disease without esophagitis: Secondary | ICD-10-CM | POA: Diagnosis not present

## 2019-04-20 DIAGNOSIS — R112 Nausea with vomiting, unspecified: Secondary | ICD-10-CM | POA: Diagnosis not present

## 2019-04-20 DIAGNOSIS — G8929 Other chronic pain: Secondary | ICD-10-CM

## 2019-04-20 MED ORDER — OMEPRAZOLE 20 MG PO CPDR: 20.0000 mg | DELAYED_RELEASE_CAPSULE | Freq: Every day | ORAL | 11 refills | Status: DC

## 2019-04-20 NOTE — Patient Instructions (Signed)
We have sent the following medications to your pharmacy for you to pick up at your convenience: omeprazole 20 mg take one tablet by mouth before breakfast.   [facility] will contact you with an appointment date and time regarding your referral.  You have been scheduled for an abdominal ultrasound at [facility] (1st floor of hospital) on 04/27/19 at 8:00am. Please arrive 15 minutes prior to your appointment for registration. Make certain not to have anything to eat or drink 6 hours prior to your appointment. Should you need to reschedule your appointment, please contact radiology at [phone]. This test typically takes about 30 minutes to perform.

## 2019-04-20 NOTE — Progress Notes (Signed)
Subjective:    Patient ID: Elizabeth DykeKendra Shaffer, female    DOB: 04/17/1996, 23 y.o.   MRN: [id number]  HPI Enrique SackKendra is a pleasant 23 year old African-American female, new to GI today referred by [facility]/Sheri Herold HarmsBeal, PA-C for evaluation of episodes of abdominal pain nausea and vomiting in setting of prior diagnosis of GERD. Review of records shows that patient had been evaluated at digestive health in May 2019.  She says she was diagnosed with GERD at that time and started on omeprazole 20 mg p.o. daily.  She did have a EGD done in March 2019 showed gastric erosions and otherwise normal exam.  I do not see that biopsies were done.  She was to have an upper abdominal ultrasound which was not done. Patient says she became pregnant after that and stopped taking the omeprazole during her entire pregnancy and did fine.  She says she did have some heartburn and indigestion but did okay without medication. She delivered her baby in July, and says after that she started having recurring symptoms with episodes of what she describes as decrease in appetite followed by crampy type abdominal pain and discomfort then onset of nausea vomiting and headache.  This occurs once or twice per month.  She says the headache will be bad at times both frontal and posterior and not unilateral.  She will frequently see spots during these episodes and generally wants to lie down.  Symptoms will last for several hours then resolved. In between these episodes she had not been having any problems with abdominal discomfort or nausea.  She has gone back on the omeprazole and says that takes care of any reflux symptoms she has. She was recently given a prescription for sumatriptan as it was felt a lot of her symptoms were consistent with migraines.  She says this does help if she takes it but makes her very sleepy and she generally does not want to take it because she is trying to take care of her baby.  It sounds as if she is  having other headaches as well or are occurring frequently but not associated with the severity of the other headaches or nausea and vomiting. She has tried to figure out what is triggering the headaches but is not certain.  She thinks sometimes if she skips a meal this may bring on a headache. She mentions some lactose intolerance, however says she is able to eat a little bit of ice cream without any difficulty so does not have severe symptoms. No regular aspirin or NSAID use  Review of Systems Pertinent positive and negative review of systems were noted in the above HPI section.  All other review of systems was otherwise negative.  Outpatient Encounter Medications as of 04/20/2019  Medication Sig   medroxyPROGESTERone (DEPO-PROVERA) 150 MG/ML injection Inject 150 mg into the muscle every 3 (three) months.   meloxicam (MOBIC) 15 MG tablet Take 1 tablet by mouth as needed.   ondansetron (ZOFRAN-ODT) 8 MG disintegrating tablet DISSOLVE 1 TABLET(8 MG) ON THE TONGUE EVERY 8 HOURS AS NEEDED FOR NAUSEA OR VOMITING   senna-docusate (SENOKOT-S) 8.6-50 MG tablet Take 2 tablets by mouth daily.   SUMAtriptan (IMITREX) 50 MG tablet Take 1 tablet by mouth as needed.   [DISCONTINUED] omeprazole (PRILOSEC) 10 MG capsule Take 2 capsules (20 mg total) by mouth daily.   cyclobenzaprine (FLEXERIL) 5 MG tablet Take 1 tablet (5 mg total) by mouth 3 (three) times daily as needed for muscle spasms. (Patient not  taking: Reported on 02/07/2019)   omeprazole (PRILOSEC) 20 MG capsule Take 1 capsule (20 mg total) by mouth daily.   [DISCONTINUED] acetaminophen (TYLENOL) 500 MG tablet Take 1,000 mg by mouth every 6 (six) hours as needed for mild pain or headache.    [DISCONTINUED] ibuprofen (ADVIL) 600 MG tablet Take 1 tablet (600 mg total) by mouth every 6 (six) hours.   [DISCONTINUED] norgestimate-ethinyl estradiol (ORTHO-CYCLEN) 0.25-35 MG-MCG tablet Take 1 tablet by mouth daily.   [DISCONTINUED] Prenatal Vit-Fe  Fumarate-FA (MULTIVITAMIN-PRENATAL) 27-0.8 MG TABS tablet Take 1 tablet by mouth daily at 12 noon.   No facility-administered encounter medications on file as of 04/20/2019.    No Known Allergies Patient Active Problem List   Diagnosis Date Noted   History of gestational hypertension 01/09/2019   GERD (gastroesophageal reflux disease) 07/20/2018   Social History   Socioeconomic History   Marital status: Single    Spouse name: Not on file   Number of children: 1   Years of education: Not on file   Highest education level: 12th grade  Occupational History   Occupation: Ambulance person    Comment: [name]  Scientist, product/process development strain: Not on file   Food insecurity    Worry: Often true    Inability: Often true   Transportation needs    Medical: No    Non-medical: No  Tobacco Use   Smoking status: Never Smoker   Smokeless tobacco: Never Used  Substance and Sexual Activity   Alcohol use: Yes    Comment: social   Drug use: Not Currently    Types: Marijuana    Comment: none since November 2019   Sexual activity: Not Currently    Birth control/protection: None  Lifestyle   Physical activity    Days per week: Not on file    Minutes per session: Not on file   Stress: Not on file  Relationships   Social connections    Talks on phone: Not on file    Gets together: Not on file    Attends religious service: Not on file    Active member of club or organization: Not on file    Attends meetings of clubs or organizations: Not on file    Relationship status: Not on file   Intimate partner violence    Fear of current or ex partner: No    Emotionally abused: No    Physically abused: No    Forced sexual activity: No  Other Topics Concern   Not on file  Social History Narrative   Not on file    [name]'s family history includes Hypertension in her mother. She was adopted.      Objective:    Vitals:   04/20/19 0950  BP: (!) 110/56    Pulse: 60  Temp: 98.4 F (36.9 C)    Physical Exam Well-developed well-nourished young African-American female in no acute distress.  Height, Weight, 121 BMI 23.4  HEENT; nontraumatic normocephalic, EOMI,PE RR LA, sclera anicteric. Oropharynx; not done/mask/Covid Neck; supple, no JVD Cardiovascular; regular rate and rhythm with S1-S2, no murmur rub or gallop Pulmonary; Clear bilaterally Abdomen; soft, nontender, nondistended, no palpable mass or hepatosplenomegaly, bowel sounds are active Rectal; not done Skin; benign exam, no jaundice rash or appreciable lesions Extremities; no clubbing cyanosis or edema skin warm and dry Neuro/Psych; alert and oriented x4, grossly nonfocal mood and affect appropriate       Assessment & Plan:   #5 23 year old African-American  female with chronic GERD, well controlled on omeprazole 20 mg p.o. every morning  Episodic abdominal discomfort, nausea vomiting and headache with episodes that will last several hours, currently having 1-2 episodes per month. I think her symptoms are consistent with migraines.  Will check abdominal ultrasound to rule out cholelithiasis.  #3 other frequent headaches  Plan; Continue omeprazole 20 mg p.o. every morning, prescription sent with 1 year of refills. Patient was given copy of an antireflux diet. Avoid aspirin and NSAIDs. Schedule upper abdominal ultrasound. Have placed referral to Neurology for headache management.  She may benefit from prophylactic medication as it seems as if she is having frequent headaches, also does not like sumatriptan as it is too sedating for her more acute episodes.  Patient will be established with Dr. Rhea Belton, will arrange follow-up based on ultrasound results.  Azizah Lisle S Deloyce Walthers PA-C 04/20/2019   Cc: Medicine, [facility]*

## 2019-04-25 ENCOUNTER — Telehealth: Payer: Self-pay | Admitting: Nurse Practitioner

## 2019-04-25 NOTE — Telephone Encounter (Signed)
Attempted to call Elizabeth Shaffer about her appointment. Due to some changes we had to make in the office, we had to reschedule her to Monday after hours.

## 2019-04-27 ENCOUNTER — Ambulatory Visit (HOSPITAL_COMMUNITY)
Admission: RE | Admit: 2019-04-27 | Discharge: 2019-04-27 | Disposition: A | Discharge status: Home / Self Care | Payer: Medicaid Other | Source: Ambulatory Visit | Attending: Physician Assistant | Admitting: Physician Assistant

## 2019-04-27 ENCOUNTER — Ambulatory Visit: Payer: Medicaid Other | Admitting: Obstetrics and Gynecology

## 2019-04-27 ENCOUNTER — Other Ambulatory Visit: Payer: Self-pay

## 2019-04-27 DIAGNOSIS — R112 Nausea with vomiting, unspecified: Secondary | ICD-10-CM | POA: Diagnosis present

## 2019-04-27 DIAGNOSIS — R1010 Upper abdominal pain, unspecified: Secondary | ICD-10-CM | POA: Insufficient documentation

## 2019-05-01 ENCOUNTER — Ambulatory Visit: Payer: Medicaid Other | Admitting: Nurse Practitioner

## 2019-05-03 ENCOUNTER — Encounter: Payer: Self-pay | Admitting: Neurology

## 2019-05-03 NOTE — Progress Notes (Signed)
Addendum: Reviewed and agree with assessment and management plan. Regino Fournet M, MD  

## 2019-06-06 ENCOUNTER — Encounter: Payer: Self-pay | Admitting: Neurology

## 2019-06-12 ENCOUNTER — Other Ambulatory Visit: Payer: Medicaid Other

## 2019-06-20 ENCOUNTER — Ambulatory Visit: Payer: Medicaid Other | Admitting: Physician Assistant

## 2019-06-21 NOTE — Progress Notes (Signed)
PATIENT NOT SEEN.  SHE DID NOT RESPOND TO MULTIPLE ATTEMPTS OF CONTACT REGARDING VISIT.   Elizabeth Shaffer is a 24 year old female who presents for headaches.  History supplemented by referring provider note.  She has a history of GERD for which she takes omeprazole.  Last year, she became pregnant and stopped omeprazole.  After giving birth in July, she started having headache   02/13/2019 LABS:  CMP unremarkable except for mildly elevated total bilirubin of 1.7; CBC normal.

## 2019-06-22 ENCOUNTER — Telehealth (INDEPENDENT_AMBULATORY_CARE_PROVIDER_SITE_OTHER): Payer: Medicaid Other | Admitting: Neurology

## 2019-06-22 ENCOUNTER — Encounter: Payer: Self-pay | Admitting: Neurology

## 2019-06-22 ENCOUNTER — Other Ambulatory Visit: Payer: Self-pay

## 2019-07-02 ENCOUNTER — Other Ambulatory Visit: Payer: Self-pay

## 2019-07-02 ENCOUNTER — Inpatient Hospital Stay (HOSPITAL_COMMUNITY)
Admission: AD | Admit: 2019-07-02 | Discharge: 2019-07-02 | Disposition: A | Discharge status: Home / Self Care | Payer: Medicaid Other | Attending: Obstetrics & Gynecology | Admitting: Obstetrics & Gynecology

## 2019-07-02 DIAGNOSIS — Z3202 Encounter for pregnancy test, result negative: Secondary | ICD-10-CM | POA: Insufficient documentation

## 2019-07-02 DIAGNOSIS — Z789 Other specified health status: Secondary | ICD-10-CM | POA: Diagnosis not present

## 2019-07-02 DIAGNOSIS — N898 Other specified noninflammatory disorders of vagina: Secondary | ICD-10-CM | POA: Insufficient documentation

## 2019-07-02 DIAGNOSIS — Z3009 Encounter for other general counseling and advice on contraception: Secondary | ICD-10-CM

## 2019-07-02 DIAGNOSIS — R1030 Lower abdominal pain, unspecified: Secondary | ICD-10-CM | POA: Insufficient documentation

## 2019-07-02 LAB — POCT PREGNANCY, URINE: Preg Test, Ur: NEGATIVE

## 2019-07-02 NOTE — MAU Provider Note (Signed)
First Provider Initiated Contact with Patient 07/02/19 2223      S [name] is a 24 y.o. G8P1001 non-pregnant female who presents to MAU today for pregnancy confirmation. She reports LMP 06/20/19, which was normal. Had unprotected IC on 1/11 and started having brownish/pink spotting and lower abdominal pain over the weekend. She denies currently being on birth control. Due to recent delivery in July patient is concerned and worried that she may be pregnant.   She denies vaginal discharge, burning, itching, frequency, or urgency. Patient reports prior to getting pregnant her cycles have always been irregular.    O BP 115/66 (BP Location: Right Arm)   Pulse 72   Temp 99.3 F (37.4 C) (Oral)   Resp 18   Ht 5' 0.5" (1.537 m)   Wt 54.6 kg   LMP 06/20/2019   SpO2 98%   BMI 23.11 kg/m  Physical Exam  Nursing note and vitals reviewed. Constitutional: She is oriented to person, place, and time. She appears well-developed and well-nourished. No distress.  Cardiovascular: Normal rate and regular rhythm.  Respiratory: Effort normal and breath sounds normal. No respiratory distress. She has no wheezes.  GI: Soft. She exhibits no distension. There is no abdominal tenderness. There is no rebound.  Neurological: She is alert and oriented to person, place, and time.  Skin: Skin is warm and dry.  Psychiatric: She has a normal mood and affect. Her behavior is normal. Thought content normal.    A Non pregnant female Medical screening exam complete Birth control counseling   P Discharge from MAU in stable condition Encouraged patient to make appointment in the office for birth control  Educated and discussed birth control options in detail with patient  Discussed with patient that pregnancy test today is negative but if she misses cycle in 2-3 weeks recommend taking additional test at home  Patient may return to MAU as needed for pregnancy related complaints  Sharyon Cable,  CNM 07/02/2019 10:33 PM

## 2019-07-02 NOTE — MAU Note (Signed)
Pt states she had period on 06/20/2019, which she reports was normal. Had a baby in July. Reports she and boyfriend had unprotected sex on around 1/11 or 1/12. She reports started having brownish/pink spotting and having lower abdominal pain. Sees spotting only when she pees. Never in underwear. Denies burning, pain, frequency, or urgency. Is not currently on birth control due to GI issues that she is waiting to have evaluated. Wants to make sure she is not pregnant.

## 2019-07-02 NOTE — Discharge Instructions (Signed)

## 2019-09-07 ENCOUNTER — Ambulatory Visit (INDEPENDENT_AMBULATORY_CARE_PROVIDER_SITE_OTHER): Payer: Medicaid Other | Admitting: Obstetrics and Gynecology

## 2019-09-07 ENCOUNTER — Encounter: Payer: Self-pay | Admitting: Obstetrics and Gynecology

## 2019-09-07 ENCOUNTER — Other Ambulatory Visit: Payer: Self-pay

## 2019-09-07 ENCOUNTER — Other Ambulatory Visit (HOSPITAL_COMMUNITY)
Admission: RE | Admit: 2019-09-07 | Discharge: 2019-09-07 | Disposition: A | Discharge status: Home / Self Care | Payer: Medicaid Other | Source: Ambulatory Visit | Attending: Obstetrics and Gynecology | Admitting: Obstetrics and Gynecology

## 2019-09-07 ENCOUNTER — Encounter: Payer: Self-pay | Admitting: *Deleted

## 2019-09-07 VITALS — BP 113/72 | HR 86 | Wt 113.0 lb

## 2019-09-07 DIAGNOSIS — Z01419 Encounter for gynecological examination (general) (routine) without abnormal findings: Secondary | ICD-10-CM | POA: Insufficient documentation

## 2019-09-07 DIAGNOSIS — Z124 Encounter for screening for malignant neoplasm of cervix: Secondary | ICD-10-CM | POA: Diagnosis present

## 2019-09-07 DIAGNOSIS — Z1151 Encounter for screening for human papillomavirus (HPV): Secondary | ICD-10-CM | POA: Diagnosis not present

## 2019-09-07 DIAGNOSIS — N939 Abnormal uterine and vaginal bleeding, unspecified: Secondary | ICD-10-CM

## 2019-09-07 DIAGNOSIS — Z01411 Encounter for gynecological examination (general) (routine) with abnormal findings: Secondary | ICD-10-CM | POA: Diagnosis not present

## 2019-09-07 DIAGNOSIS — Z0000 Encounter for general adult medical examination without abnormal findings: Secondary | ICD-10-CM | POA: Diagnosis not present

## 2019-09-07 DIAGNOSIS — B373 Candidiasis of vulva and vagina: Secondary | ICD-10-CM | POA: Diagnosis not present

## 2019-09-07 MED ORDER — NORGESTIMATE-ETH ESTRADIOL 0.25-35 MG-MCG PO TABS: 1.0000 | ORAL_TABLET | Freq: Every day | ORAL | 2 refills | Status: DC

## 2019-09-07 NOTE — Progress Notes (Signed)
GYNECOLOGY ANNUAL PREVENTATIVE CARE ENCOUNTER NOTE  History:     Elizabeth Shaffer is a 24 y.o. G22P1001 female here for a routine annual gynecologic exam.  Current complaints: abnormal bleeding which started a few months ago. States she is spotting in-between her periods. Her period is lasting 7 days then stops, then spotting in-between. Not breastfeeding. Denies abnormal vaginal bleeding, discharge, pelvic pain, problems with intercourse or other gynecologic concerns.   Gynecologic History No LMP recorded. (Menstrual status: Needs Pregnancy Test). Contraception: none Last Pap: 2020. Results were: abnormal with negative HPV  Obstetric History OB History  Gravida Para Term Preterm AB Living  1 1 1  0 0 1  SAB TAB Ectopic Multiple Live Births  0 0 0 0 1    # Outcome Date GA Lbr Len/2nd Weight Sex Delivery Anes PTL Lv  1 Term 01/09/19 [redacted]w[redacted]d 13:10 / 03:07 5 lb 13.5 oz (2.651 kg) F Vag-Spont EPI  LIV    Past Medical History:  Diagnosis Date  . GERD (gastroesophageal reflux disease)   . Headache   . HTN (hypertension)   . Migraines     Past Surgical History:  Procedure Laterality Date  . MOUTH SURGERY  2011  . MULTIPLE TOOTH EXTRACTIONS    . WISDOM TOOTH EXTRACTION      Current Outpatient Medications on File Prior to Visit  Medication Sig Dispense Refill  . cyclobenzaprine (FLEXERIL) 5 MG tablet Take 1 tablet (5 mg total) by mouth 3 (three) times daily as needed for muscle spasms. (Patient not taking: Reported on 02/07/2019) 20 tablet 0  . medroxyPROGESTERone (DEPO-PROVERA) 150 MG/ML injection Inject 150 mg into the muscle every 3 (three) months.    . meloxicam (MOBIC) 15 MG tablet Take 1 tablet by mouth as needed.    02/09/2019 omeprazole (PRILOSEC) 20 MG capsule Take 1 capsule (20 mg total) by mouth daily. 30 capsule 11  . ondansetron (ZOFRAN-ODT) 8 MG disintegrating tablet DISSOLVE 1 TABLET(8 MG) ON THE TONGUE EVERY 8 HOURS AS NEEDED FOR NAUSEA OR VOMITING 20 tablet 0  . senna-docusate  (SENOKOT-S) 8.6-50 MG tablet Take 2 tablets by mouth daily. 20 tablet 0  . SUMAtriptan (IMITREX) 50 MG tablet Take 1 tablet by mouth as needed.     No current facility-administered medications on file prior to visit.    No Known Allergies  Social History:  reports that she has never smoked. She has never used smokeless tobacco. She reports current alcohol use. She reports previous drug use. Drug: Marijuana.  Family History  Adopted: Yes  Problem Relation Age of Onset  . Hypertension Mother     The following portions of the patient's history were reviewed and updated as appropriate: allergies, current medications, past family history, past medical history, past social history, past surgical history and problem list.  Review of Systems Pertinent items noted in HPI and remainder of comprehensive ROS otherwise negative.  Physical Exam:  BP 113/72   Pulse 86   Wt 113 lb (51.3 kg)   BMI 21.71 kg/m  CONSTITUTIONAL: Well-developed, well-nourished female in no acute distress.  HENT:  Normocephalic, atraumatic, External right and left ear normal. Oropharynx is clear and moist EYES: Conjunctivae and EOM are normal. Pupils are equal, round, and reactive to light. No scleral icterus.  NECK: Normal range of motion, supple, no masses.  Normal thyroid.  SKIN: Skin is warm and dry. No rash noted. Not diaphoretic. No erythema. No pallor. MUSCULOSKELETAL: Normal range of motion. No tenderness.  No cyanosis, clubbing,  or edema.  2+ distal pulses. NEUROLOGIC: Alert and oriented to person, place, and time. Normal reflexes, muscle tone coordination.  PSYCHIATRIC: Normal mood and affect. Normal behavior. Normal judgment and thought content. CARDIOVASCULAR: Normal heart rate noted, regular rhythm RESPIRATORY: Clear to auscultation bilaterally. Effort and breath sounds normal, no problems with respiration noted. BREASTS: Symmetric in size. No masses, tenderness, skin changes, nipple drainage, or  lymphadenopathy bilaterally. Performed in the presence of a chaperone. ABDOMEN: Soft, no distention noted.  No tenderness, rebound or guarding.  PELVIC: Normal appearing external genitalia and urethral meatus; normal appearing vaginal mucosa and cervix.  No abnormal discharge noted.  Pap smear obtained.  Normal uterine size, no other palpable masses, no uterine or adnexal tenderness.  Performed in the presence of a chaperone.   Assessment and Plan:   1. Pap smear for cervical cancer screening  - Cytology - PAP( [place]) - Cervicovaginal ancillary only( [place]) - RPR - HIV Antibody (routine testing w rflx) - Hepatitis B surface antigen - Hepatitis C antibody  2. Encounter for annual routine gynecological examination  - Cervicovaginal ancillary only( [place]) - RPR - HIV Antibody (routine testing w rflx) - Hepatitis B surface antigen - Hepatitis C antibody  3. Abnormal uterine bleeding  - Likely hormonal- Recently had a  - norgestimate-ethinyl estradiol (ORTHO-CYCLEN) 0.25-35 MG-MCG tablet; Take 1 tablet by mouth daily.  Dispense: 1 Package; Refill: 2    Will follow up results of pap smear and manage accordingly. Routine preventative health maintenance measures emphasized. Please refer to After Visit Summary for other counseling recommendations.    Cieara Stierwalt, Artist Pais, [place] for [employer], [place]

## 2019-09-07 NOTE — Progress Notes (Signed)
Last 3 months been on [facility] / was having cycle every two weeks, stopped and became normal and in Jan 2021 will have a cycle 5-6 days later it will come back heavy. Pain in sides of abdomen.   Information from primary care earlier today:   She had a period February 1 through February 11 and then started bleeding again on February 16 through the 23rd  This past month she started bleeding on March 8 and bled through March 14 And she had more bleeding March 20-23  Periods are not heavy they are just more frequent and irregular now She is never appeared like this in the past She is not breast-feeding  Patient states she had a similar pattern in January  She recently had a baby who 64 months old

## 2019-09-08 LAB — HEPATITIS B SURFACE ANTIGEN: Hepatitis B Surface Ag: NEGATIVE

## 2019-09-08 LAB — HIV ANTIBODY (ROUTINE TESTING W REFLEX): HIV Screen 4th Generation wRfx: NONREACTIVE

## 2019-09-08 LAB — RPR: RPR Ser Ql: NONREACTIVE

## 2019-09-08 LAB — HEPATITIS C ANTIBODY: Hep C Virus Ab: <0.1 s/co ratio (ref 0.0–0.9)

## 2019-09-11 LAB — CERVICOVAGINAL ANCILLARY ONLY
Bacterial Vaginitis (gardnerella): NEGATIVE
Candida Glabrata: NEGATIVE
Candida Vaginitis: POSITIVE — AB
Comment: NEGATIVE
Comment: NEGATIVE
Comment: NEGATIVE
Comment: NEGATIVE
Trichomonas: NEGATIVE

## 2019-09-12 LAB — CYTOLOGY - PAP
Chlamydia: NEGATIVE
Comment: NEGATIVE
Comment: NEGATIVE
Comment: NORMAL
Diagnosis: NEGATIVE
High risk HPV: NEGATIVE
Neisseria Gonorrhea: NEGATIVE

## 2019-09-13 ENCOUNTER — Telehealth: Payer: Self-pay | Admitting: Lactation Services

## 2019-09-13 MED ORDER — FLUCONAZOLE 150 MG PO TABS: 150.0000 mg | ORAL_TABLET | Freq: Once | ORAL | 0 refills | Status: AC

## 2019-09-13 NOTE — Telephone Encounter (Signed)
Called patient with results of her Vaginal Swab, LM with patient that medication has been sent to her Pharmacy. Diflucan sent to Pharmacy per standing order. Will send My Chart message to patient. LM for patient to call the office is she has further questions or concerns.

## 2019-10-20 ENCOUNTER — Ambulatory Visit (INDEPENDENT_AMBULATORY_CARE_PROVIDER_SITE_OTHER): Payer: Medicaid Other

## 2019-10-20 ENCOUNTER — Encounter (HOSPITAL_COMMUNITY): Payer: Self-pay

## 2019-10-20 ENCOUNTER — Other Ambulatory Visit: Payer: Self-pay

## 2019-10-20 ENCOUNTER — Ambulatory Visit (HOSPITAL_COMMUNITY)
Admission: EM | Admit: 2019-10-20 | Discharge: 2019-10-20 | Disposition: A | Discharge status: Home / Self Care | Payer: Medicaid Other | Attending: Physician Assistant | Admitting: Physician Assistant

## 2019-10-20 DIAGNOSIS — S62327A Displaced fracture of shaft of fifth metacarpal bone, left hand, initial encounter for closed fracture: Secondary | ICD-10-CM

## 2019-10-20 DIAGNOSIS — M79642 Pain in left hand: Secondary | ICD-10-CM

## 2019-10-20 MED ORDER — HYDROCODONE-ACETAMINOPHEN 5-325 MG PO TABS
1.0000 | ORAL_TABLET | Freq: Once | ORAL | Status: AC
  Administered 2019-10-20: 1 via ORAL

## 2019-10-20 MED ORDER — HYDROCODONE-ACETAMINOPHEN 5-325 MG PO TABS
ORAL_TABLET | ORAL | Status: AC
  Filled 2019-10-20: qty 1

## 2019-10-20 MED ORDER — IBUPROFEN 600 MG PO TABS
600.0000 mg | ORAL_TABLET | Freq: Four times a day (QID) | ORAL | 0 refills | Status: DC | PRN
Start: 2019-10-20 — End: 2020-02-04

## 2019-10-20 MED ORDER — ACETAMINOPHEN 500 MG PO TABS: 1000.0000 mg | ORAL_TABLET | Freq: Four times a day (QID) | ORAL | 0 refills | Status: DC | PRN

## 2019-10-20 NOTE — Progress Notes (Signed)
Orthopedic Tech Progress Note Patient Details:  Elizabeth Shaffer September 22, 1995 [id number]  Ortho Devices Type of Ortho Device: Short arm splint Ortho Device/Splint Location: LUE Ortho Device/Splint Interventions: Application   Post Interventions Patient Tolerated: Well Instructions Provided: Care of device   Elga Santy E Christol Thetford 10/20/2019, 7:39 PM

## 2019-10-20 NOTE — Discharge Instructions (Signed)
Continue to wear the splint. Take 2 extra strength Tylenol and one of the 600 mg ibuprofen every 8 hours  Call the hand office supplied first thing Monday morning to have evaluation.
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
## 2019-10-23 ENCOUNTER — Emergency Department (HOSPITAL_COMMUNITY)
Admission: EM | Admit: 2019-10-23 | Discharge: 2019-10-24 | Disposition: A | Discharge status: Home / Self Care | Payer: Medicaid Other | Attending: Emergency Medicine | Admitting: Emergency Medicine

## 2019-10-23 ENCOUNTER — Emergency Department (HOSPITAL_COMMUNITY): Payer: Medicaid Other

## 2019-10-23 DIAGNOSIS — M79602 Pain in left arm: Secondary | ICD-10-CM | POA: Insufficient documentation

## 2019-10-23 DIAGNOSIS — Z793 Long term (current) use of hormonal contraceptives: Secondary | ICD-10-CM | POA: Diagnosis not present

## 2019-10-23 DIAGNOSIS — G8911 Acute pain due to trauma: Secondary | ICD-10-CM | POA: Insufficient documentation

## 2019-10-23 DIAGNOSIS — Z79899 Other long term (current) drug therapy: Secondary | ICD-10-CM | POA: Diagnosis not present
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
## 2019-10-23 NOTE — Discharge Instructions (Signed)
Follow up with hand orthopedics as previously referred, but if unable to see them, go to your primary care provider to help with coordinating appropriate care for your fracture.

## 2019-10-26 DIAGNOSIS — S62327A Displaced fracture of shaft of fifth metacarpal bone, left hand, initial encounter for closed fracture: Secondary | ICD-10-CM | POA: Insufficient documentation

## 2019-10-26 DIAGNOSIS — S62308A Unspecified fracture of other metacarpal bone, initial encounter for closed fracture: Secondary | ICD-10-CM | POA: Insufficient documentation

## 2019-10-30 ENCOUNTER — Encounter (HOSPITAL_COMMUNITY): Payer: Self-pay | Admitting: Orthopedic Surgery

## 2019-10-30 ENCOUNTER — Other Ambulatory Visit: Payer: Self-pay

## 2019-10-30 ENCOUNTER — Other Ambulatory Visit (HOSPITAL_COMMUNITY)
Admission: RE | Admit: 2019-10-30 | Discharge: 2019-10-30 | Disposition: A | Discharge status: Home / Self Care | Payer: Medicaid Other | Source: Ambulatory Visit | Attending: Orthopedic Surgery | Admitting: Orthopedic Surgery

## 2019-10-30 DIAGNOSIS — Z20822 Contact with and (suspected) exposure to covid-19: Secondary | ICD-10-CM | POA: Insufficient documentation

## 2019-10-30 DIAGNOSIS — Z01812 Encounter for preprocedural laboratory examination: Secondary | ICD-10-CM | POA: Diagnosis present

## 2019-10-30 NOTE — Progress Notes (Signed)
Pt denies SOB, chest pain, and being under the care of a cardiologist. Pt stated that she receives primary care at [facility]. Pt denies having a stress test, echo and cardiac cath. Pt denies having an EKG and chest x ray. Pt denies recent labs. Pt made aware to stop taking Aspirin (unless otherwise advised by surgeon), vitamins, fish oil and herbal medications. Do not take any NSAIDs ie: Ibuprofen, Advil, Naproxen (Aleve), Motrin, BC and Goody Powder. Pt reminded to quarantine. Pt verbalized understanding of all pre-op instructions.

## 2019-10-31 ENCOUNTER — Ambulatory Visit (HOSPITAL_COMMUNITY)
Admission: RE | Admit: 2019-10-31 | Discharge: 2019-10-31 | Disposition: A | Discharge status: Home / Self Care | Payer: Medicaid Other | Attending: Orthopedic Surgery | Admitting: Orthopedic Surgery

## 2019-10-31 ENCOUNTER — Encounter (HOSPITAL_COMMUNITY): Payer: Self-pay | Admitting: Orthopedic Surgery

## 2019-10-31 ENCOUNTER — Encounter (HOSPITAL_COMMUNITY)
Admission: RE | Disposition: A | Discharge status: Home / Self Care | Payer: Self-pay | Source: Home / Self Care | Attending: Orthopedic Surgery

## 2019-10-31 ENCOUNTER — Ambulatory Visit (HOSPITAL_COMMUNITY): Payer: Medicaid Other

## 2019-10-31 ENCOUNTER — Ambulatory Visit (HOSPITAL_COMMUNITY): Payer: Medicaid Other | Admitting: Anesthesiology

## 2019-10-31 DIAGNOSIS — S62307A Unspecified fracture of fifth metacarpal bone, left hand, initial encounter for closed fracture: Secondary | ICD-10-CM | POA: Diagnosis not present

## 2019-10-31 DIAGNOSIS — X58XXXA Exposure to other specified factors, initial encounter: Secondary | ICD-10-CM | POA: Insufficient documentation

## 2019-10-31 DIAGNOSIS — I10 Essential (primary) hypertension: Secondary | ICD-10-CM | POA: Diagnosis not present

## 2019-10-31 DIAGNOSIS — Z79899 Other long term (current) drug therapy: Secondary | ICD-10-CM | POA: Insufficient documentation

## 2019-10-31 DIAGNOSIS — K219 Gastro-esophageal reflux disease without esophagitis: Secondary | ICD-10-CM | POA: Diagnosis not present

## 2019-10-31 HISTORY — PX: OPEN REDUCTION INTERNAL FIXATION (ORIF) METACARPAL: SHX6234

## 2019-10-31 HISTORY — DX: Other specified postprocedural states: R11.2

## 2019-10-31 HISTORY — DX: Other specified postprocedural states: Z98.890

## 2019-10-31 HISTORY — DX: Anemia, unspecified: D64.9

## 2019-10-31 HISTORY — DX: Gastritis, unspecified, without bleeding: K29.70

## 2019-10-31 HISTORY — DX: Unspecified fracture of fifth metacarpal bone, left hand, initial encounter for closed fracture: S62.307A

## 2019-10-31 LAB — CBC
HCT: 44.1 % (ref 36.0–46.0)
Hemoglobin: 14.7 g/dL (ref 12.0–15.0)
MCH: 31.5 pg (ref 26.0–34.0)
MCHC: 33.3 g/dL (ref 30.0–36.0)
MCV: 94.6 fL (ref 80.0–100.0)
Platelets: 196 10*3/uL (ref 150–400)
RBC: 4.66 MIL/uL (ref 3.87–5.11)
RDW: 12.1 % (ref 11.5–15.5)
WBC: 6.3 10*3/uL (ref 4.0–10.5)
nRBC: 0 % (ref 0.0–0.2)

## 2019-10-31 LAB — SARS CORONAVIRUS 2 (TAT 6-24 HRS): SARS Coronavirus 2: NEGATIVE

## 2019-10-31 LAB — POCT PREGNANCY, URINE: Preg Test, Ur: NEGATIVE

## 2019-10-31 SURGERY — OPEN REDUCTION INTERNAL FIXATION (ORIF) METACARPAL
Anesthesia: General | Site: Finger | Laterality: Left

## 2019-10-31 MED ORDER — PROPOFOL 10 MG/ML IV BOLUS
INTRAVENOUS | Status: DC | PRN
  Administered 2019-10-31: 200 mg via INTRAVENOUS

## 2019-10-31 MED ORDER — FENTANYL CITRATE (PF) 250 MCG/5ML IJ SOLN
INTRAMUSCULAR | Status: AC
  Filled 2019-10-31: qty 5

## 2019-10-31 MED ORDER — ONDANSETRON HCL 4 MG/2ML IJ SOLN
INTRAMUSCULAR | Status: DC | PRN
  Administered 2019-10-31: 4 mg via INTRAVENOUS

## 2019-10-31 MED ORDER — ACETAMINOPHEN 10 MG/ML IV SOLN
1000.0000 mg | Freq: Once | INTRAVENOUS | Status: DC | PRN
  Administered 2019-10-31: 1000 mg via INTRAVENOUS

## 2019-10-31 MED ORDER — BUPIVACAINE HCL (PF) 0.25 % IJ SOLN
INTRAMUSCULAR | Status: AC
  Filled 2019-10-31: qty 30

## 2019-10-31 MED ORDER — LIDOCAINE HCL (CARDIAC) PF 100 MG/5ML IV SOSY
PREFILLED_SYRINGE | INTRAVENOUS | Status: DC | PRN
  Administered 2019-10-31: 60 mg via INTRATRACHEAL

## 2019-10-31 MED ORDER — 0.9 % SODIUM CHLORIDE (POUR BTL) OPTIME
TOPICAL | Status: DC | PRN
  Administered 2019-10-31: 1000 mL

## 2019-10-31 MED ORDER — MIDAZOLAM HCL 2 MG/2ML IJ SOLN
INTRAMUSCULAR | Status: AC
  Filled 2019-10-31: qty 2

## 2019-10-31 MED ORDER — OXYCODONE HCL 5 MG PO TABS
ORAL_TABLET | ORAL | Status: AC
  Filled 2019-10-31: qty 1

## 2019-10-31 MED ORDER — LACTATED RINGERS IV SOLN: INTRAVENOUS | Status: DC

## 2019-10-31 MED ORDER — ACETAMINOPHEN 500 MG PO TABS: 1000.0000 mg | ORAL_TABLET | Freq: Once | ORAL | Status: DC | PRN

## 2019-10-31 MED ORDER — SCOPOLAMINE 1 MG/3DAYS TD PT72
MEDICATED_PATCH | TRANSDERMAL | Status: AC
  Filled 2019-10-31: qty 1

## 2019-10-31 MED ORDER — MIDAZOLAM HCL 2 MG/2ML IJ SOLN
INTRAMUSCULAR | Status: DC | PRN
  Administered 2019-10-31: 2 mg via INTRAVENOUS

## 2019-10-31 MED ORDER — METHOCARBAMOL 500 MG PO TABS: 500.0000 mg | ORAL_TABLET | Freq: Four times a day (QID) | ORAL | 0 refills | Status: DC

## 2019-10-31 MED ORDER — ACETAMINOPHEN 160 MG/5ML PO SOLN: 1000.0000 mg | Freq: Once | ORAL | Status: DC | PRN

## 2019-10-31 MED ORDER — PROMETHAZINE HCL 12.5 MG PO TABS
12.5000 mg | ORAL_TABLET | Freq: Four times a day (QID) | ORAL | 0 refills | Status: DC | PRN
Start: 2019-10-31 — End: 2020-02-04

## 2019-10-31 MED ORDER — SCOPOLAMINE 1 MG/3DAYS TD PT72
MEDICATED_PATCH | TRANSDERMAL | Status: DC | PRN
  Administered 2019-10-31: 1 via TRANSDERMAL

## 2019-10-31 MED ORDER — FENTANYL CITRATE (PF) 100 MCG/2ML IJ SOLN
INTRAMUSCULAR | Status: AC
  Filled 2019-10-31: qty 2

## 2019-10-31 MED ORDER — CEFAZOLIN SODIUM-DEXTROSE 2-4 GM/100ML-% IV SOLN
2.0000 g | INTRAVENOUS | Status: AC
  Administered 2019-10-31: 2 g via INTRAVENOUS
  Filled 2019-10-31: qty 100

## 2019-10-31 MED ORDER — PROPOFOL 10 MG/ML IV BOLUS
INTRAVENOUS | Status: AC
  Filled 2019-10-31: qty 20

## 2019-10-31 MED ORDER — ACETAMINOPHEN 10 MG/ML IV SOLN
INTRAVENOUS | Status: AC
  Filled 2019-10-31: qty 100

## 2019-10-31 MED ORDER — OXYCODONE HCL 5 MG PO TABS: 5.0000 mg | ORAL_TABLET | ORAL | 0 refills | Status: DC | PRN

## 2019-10-31 MED ORDER — DEXAMETHASONE SODIUM PHOSPHATE 10 MG/ML IJ SOLN
INTRAMUSCULAR | Status: DC | PRN
  Administered 2019-10-31: 10 mg via INTRAVENOUS

## 2019-10-31 MED ORDER — OXYCODONE HCL 5 MG PO TABS: 5.0000 mg | ORAL_TABLET | Freq: Once | ORAL | Status: DC | PRN

## 2019-10-31 MED ORDER — CEPHALEXIN 500 MG PO CAPS: 500.0000 mg | ORAL_CAPSULE | Freq: Four times a day (QID) | ORAL | 0 refills | Status: AC

## 2019-10-31 MED ORDER — FENTANYL CITRATE (PF) 100 MCG/2ML IJ SOLN
25.0000 ug | INTRAMUSCULAR | Status: DC | PRN
  Administered 2019-10-31: 50 ug via INTRAVENOUS
  Administered 2019-10-31: 25 ug via INTRAVENOUS

## 2019-10-31 MED ORDER — OXYCODONE HCL 5 MG/5ML PO SOLN: 5.0000 mg | Freq: Once | ORAL | Status: DC | PRN

## 2019-10-31 MED ORDER — FENTANYL CITRATE (PF) 250 MCG/5ML IJ SOLN
INTRAMUSCULAR | Status: DC | PRN
  Administered 2019-10-31 (×3): 50 ug via INTRAVENOUS
  Administered 2019-10-31: 25 ug via INTRAVENOUS

## 2019-10-31 SURGICAL SUPPLY — 55 items
BLADE CLIPPER SURG (BLADE) IMPLANT
BNDG ELASTIC 3X5.8 VLCR STR LF (GAUZE/BANDAGES/DRESSINGS) ×2 IMPLANT
BNDG ELASTIC 4X5.8 VLCR STR LF (GAUZE/BANDAGES/DRESSINGS) ×2 IMPLANT
BNDG ESMARK 4X9 LF (GAUZE/BANDAGES/DRESSINGS) ×2 IMPLANT
BNDG GAUZE ELAST 4 BULKY (GAUZE/BANDAGES/DRESSINGS) ×2 IMPLANT
CORD BIPOLAR FORCEPS 12FT (ELECTRODE) ×2 IMPLANT
COVER SURGICAL LIGHT HANDLE (MISCELLANEOUS) ×2 IMPLANT
COVER WAND RF STERILE (DRAPES) ×2 IMPLANT
CUFF TOURN SGL LL 8 NO SLV (TOURNIQUET CUFF) ×2 IMPLANT
CUFF TOURN SGL QUICK 18X4 (TOURNIQUET CUFF) ×2 IMPLANT
CUFF TOURN SGL QUICK 24 (TOURNIQUET CUFF)
CUFF TRNQT CYL 24X4X16.5-23 (TOURNIQUET CUFF) IMPLANT
DECANTER SPIKE VIAL GLASS SM (MISCELLANEOUS) ×2 IMPLANT
DRAIN TLS ROUND 10FR (DRAIN) IMPLANT
DRAPE OEC MINIVIEW 54X84 (DRAPES) ×2 IMPLANT
DRAPE SURG 17X23 STRL (DRAPES) ×2 IMPLANT
GAUZE SPONGE 4X4 12PLY STRL (GAUZE/BANDAGES/DRESSINGS) ×2 IMPLANT
GAUZE XEROFORM 1X8 LF (GAUZE/BANDAGES/DRESSINGS) ×2 IMPLANT
GLOVE BIO SURGEON STRL SZ 6.5 (GLOVE) ×2 IMPLANT
GLOVE BIOGEL M 8.0 STRL (GLOVE) ×2 IMPLANT
GLOVE SS BIOGEL STRL SZ 8 (GLOVE) ×3 IMPLANT
GLOVE SUPERSENSE BIOGEL SZ 8 (GLOVE) ×3
GOWN STRL REUS W/ TWL LRG LVL3 (GOWN DISPOSABLE) ×1 IMPLANT
GOWN STRL REUS W/ TWL XL LVL3 (GOWN DISPOSABLE) ×1 IMPLANT
GOWN STRL REUS W/TWL LRG LVL3 (GOWN DISPOSABLE) ×1
GOWN STRL REUS W/TWL XL LVL3 (GOWN DISPOSABLE) ×1
KIT BASIN OR (CUSTOM PROCEDURE TRAY) ×2 IMPLANT
KIT INNATE INSTRUMENT FOR 4.5 (INSTRUMENTS) ×2 IMPLANT
KIT TURNOVER KIT B (KITS) ×2 IMPLANT
MANIFOLD NEPTUNE II (INSTRUMENTS) ×2 IMPLANT
NAIL IM THRD INNATE 4.5X40 (Nail) ×2 IMPLANT
NEEDLE 22X1 1/2 (OR ONLY) (NEEDLE) IMPLANT
NS IRRIG 1000ML POUR BTL (IV SOLUTION) ×2 IMPLANT
PACK ORTHO EXTREMITY (CUSTOM PROCEDURE TRAY) ×2 IMPLANT
PAD ARMBOARD 7.5X6 YLW CONV (MISCELLANEOUS) ×4 IMPLANT
PAD CAST 3X4 CTTN HI CHSV (CAST SUPPLIES) ×1 IMPLANT
PAD CAST 4YDX4 CTTN HI CHSV (CAST SUPPLIES) ×1 IMPLANT
PADDING CAST COTTON 3X4 STRL (CAST SUPPLIES) ×1
PADDING CAST COTTON 4X4 STRL (CAST SUPPLIES) ×1
PADDING CAST COTTON 6X4 STRL (CAST SUPPLIES) ×2 IMPLANT
SOL PREP POV-IOD 4OZ 10% (MISCELLANEOUS) ×4 IMPLANT
SPONGE LAP 4X18 RFD (DISPOSABLE) IMPLANT
SUT MNCRL AB 4-0 PS2 18 (SUTURE) ×2 IMPLANT
SUT PROLENE 3 0 PS 2 (SUTURE) IMPLANT
SUT PROLENE 4 0 P 3 18 (SUTURE) ×2 IMPLANT
SUT VIC AB 3-0 FS2 27 (SUTURE) IMPLANT
SUT VIC AB 4-0 P-3 18XBRD (SUTURE) ×1 IMPLANT
SUT VIC AB 4-0 P3 18 (SUTURE) ×1
SYR CONTROL 10ML LL (SYRINGE) ×2 IMPLANT
SYSTEM CHEST DRAIN TLS 7FR (DRAIN) IMPLANT
TOWEL GREEN STERILE (TOWEL DISPOSABLE) ×2 IMPLANT
TOWEL GREEN STERILE FF (TOWEL DISPOSABLE) ×2 IMPLANT
TUBE CONNECTING 12X1/4 (SUCTIONS) ×2 IMPLANT
TUBE EVACUATION TLS (MISCELLANEOUS) ×2 IMPLANT
WATER STERILE IRR 1000ML POUR (IV SOLUTION) ×2 IMPLANT

## 2019-10-31 NOTE — Op Note (Signed)
Operative note Oct 31, 2019  Dominica Severin MD.  Preoperative diagnosis left fifth metacarpal fracture displaced and angulated with malrotation  Postop diagnosis the same  Operative procedure: #1 ORIF fifth metacarpal fracture with Exomed 40 mm x 4.5 mm metacarpal nail #2 4 view x-ray series left hand  Dominica Severin MD  Anesthesia General  Tourniquet time 0  Estimated blood loss minimal  Operative procedure: Patient was seen by myself underwent a general anesthetic after extensive counseling was prepped and draped in the usual sterile fashion with Betadine scrub and paint.  Previously I performed a Hibiclens scrub.  Sterile field was secured and timeout was observed.  The operation commenced with a small incision of the fifth left metacarpal head.  Dissection was carried down the extensor tendon was split and a intramedullary rod for a 4.5 mm nail was introduced.  Across the fracture site which was displaced and angulated I reduce this and following this we passed the guidewire across the fracture site.  I then measured and reamed for a 40 mm nail 4.5 in width.  I then placed the intramedullary screw under live fluoroscopy without difficulty.  Multiple x-rays were taken which showed excellent reduction.  Fingers plate looked excellent and was recreated nicely with the surgical reconstruction of the metacarpal.  I irrigated the area copiously closed the split in the extensor and took final copy x-rays.  No tourniquet was insufflated and bleeding was minimal.  Patient had soft compartments and no complications.  Standard dressing and splint were placed.  She was taken to the recovery room in stable condition.  I will see her in 12 days in the office.  Discharge medicines Keflex oxycodone Robaxin and Zofran.  It was a pleasure to see her today  Muskan Bolla MD.

## 2019-10-31 NOTE — Transfer of Care (Signed)
Immediate Anesthesia Transfer of Care Note  Patient: Abbygail [place]hoite  Procedure(s) Performed: OPEN REDUCTION INTERNAL FIXATION (ORIF) LEFT FIFTH METACARPAL FRACTURE USING EXSOMED NAIL 4.13mm x 27mm (Left Finger)  Patient Location: PACU  Anesthesia Type:General  Level of Consciousness: awake, alert  and oriented  Airway & Oxygen Therapy: Patient Spontanous Breathing  Post-op Assessment: Report given to RN and Post -op Vital signs reviewed and stable  Post vital signs: Reviewed and stable  Last Vitals:  Vitals Value Taken Time  BP 113/102 10/31/19 1837  Temp 36.8 C 10/31/19 1837  Pulse 103 10/31/19 1841  Resp 18 10/31/19 1841  SpO2 100 % 10/31/19 1841  Vitals shown include unvalidated device data.  Last Pain:  Vitals:   10/31/19 1837  TempSrc:   PainSc: 10-Worst pain ever      Patients Stated Pain Goal: 3 (10/31/19 1837)  Complications: No apparent anesthesia complications

## 2019-10-31 NOTE — Anesthesia Procedure Notes (Signed)
Procedure Name: LMA Insertion Date/Time: 10/31/2019 5:45 PM Performed by: Molli Hazard, CRNA Pre-anesthesia Checklist: Patient identified, Emergency Drugs available, Suction available and Patient being monitored Patient Re-evaluated:Patient Re-evaluated prior to induction Oxygen Delivery Method: Circle system utilized Preoxygenation: Pre-oxygenation with 100% oxygen Induction Type: IV induction Ventilation: Mask ventilation without difficulty LMA: LMA inserted LMA Size: 4.0 Placement Confirmation: positive ETCO2 and breath sounds checked- equal and bilateral Tube secured with: Tape

## 2019-10-31 NOTE — Discharge Instructions (Signed)

## 2019-10-31 NOTE — H&P (Signed)
Elizabeth Shaffer is an 24 y.o. female.   Chief Complaint: Left fifth metacarpal fracture displaced in nature we will plan for ORIF HPI: Left fifth metacarpal fracture displaced in nature we will plan for open reduction internal fixation.  She denies other injury.  Patient presents for evaluation and treatment of the of their upper extremity predicament. The patient denies neck, back, chest or  abdominal pain. The patient notes that they have no lower extremity problems. The patients primary complaint is noted. We are planning surgical care pathway for the upper extremity.  Past Medical History:  Diagnosis Date  . Anemia    during pregnancy  . Displaced fracture of fifth metacarpal bone of left hand   . Gastritis   . GERD (gastroesophageal reflux disease)   . Headache   . HTN (hypertension)   . Migraines   . PONV (postoperative nausea and vomiting)     Past Surgical History:  Procedure Laterality Date  . MOUTH SURGERY  2011  . MULTIPLE TOOTH EXTRACTIONS    . WISDOM TOOTH EXTRACTION      Family History  Adopted: Yes  Problem Relation Age of Onset  . Hypertension Mother    Social History:  reports that she has never smoked. She has never used smokeless tobacco. She reports current alcohol use. She reports previous drug use. Drug: Marijuana.  Allergies: No Known Allergies  Medications Prior to Admission  Medication Sig Dispense Refill  . acetaminophen (TYLENOL) 500 MG tablet Take 2 tablets (1,000 mg total) by mouth every 6 (six) hours as needed for mild pain or moderate pain. 30 tablet 0  . ibuprofen (ADVIL) 600 MG tablet Take 1 tablet (600 mg total) by mouth every 6 (six) hours as needed. (Patient taking differently: Take 600 mg by mouth every 6 (six) hours as needed for mild pain. ) 30 tablet 0  . norgestimate-ethinyl estradiol (ORTHO-CYCLEN) 0.25-35 MG-MCG tablet Take 1 tablet by mouth daily. 1 Package 2  . omeprazole (PRILOSEC) 20 MG capsule Take 1 capsule (20 mg total) by mouth  daily. 30 capsule 11    Results for orders placed or performed during the hospital encounter of 10/30/19 (from the past 48 hour(s))  SARS CORONAVIRUS 2 (TAT 6-24 HRS) Nasopharyngeal Nasopharyngeal Swab     Status: None   Collection Time: 10/30/19  2:08 PM   Specimen: Nasopharyngeal Swab  Result Value Ref Range   SARS Coronavirus 2 NEGATIVE NEGATIVE    Comment: (NOTE) SARS-CoV-2 target nucleic acids are NOT DETECTED. The SARS-CoV-2 RNA is generally detectable in upper and lower respiratory specimens during the acute phase of infection. Negative results do not preclude SARS-CoV-2 infection, do not rule out co-infections with other pathogens, and should not be used as the sole basis for treatment or other patient management decisions. Negative results must be combined with clinical observations, patient history, and epidemiological information. The expected result is Negative. Fact Sheet for Patients: SugarRoll.be Fact Sheet for Healthcare Providers: https://www.woods-mathews.com/ This test is not yet approved or cleared by the [place] FDA and  has been authorized for detection and/or diagnosis of SARS-CoV-2 by FDA under an Emergency Use Authorization (EUA). This EUA will remain  in effect (meaning this test can be used) for the duration of the COVID-19 declaration under Section 56 4(b)(1) of the Act, 21 U.S.C. section 360bbb-3(b)(1), unless the authorization is terminated or revoked sooner. Performed at [facility], [place] [address] [place], [place] [address]    No results found.  Review of Systems  Blood pressure 112/65, pulse 60, temperature 98.1 F (36.7 C), temperature source Oral, resp. rate 20, height 5' 0.5" (1.537 m), weight 52.2 kg, last menstrual period 10/17/2019, SpO2 100 %, not currently breastfeeding. Physical Exam  Displaced left fifth metacarpal fracture painful angulated and with malrotation of the finger.  I  discussed with the patient risk and benefits of surgical and nonsurgical intervention with this in mind she desires to proceed.  The patient is alert and oriented in no acute distress. The patient complains of pain in the affected upper extremity.  The patient is noted to have a normal HEENT exam. Lung fields show equal chest expansion and no shortness of breath. Abdomen exam is nontender without distention. Lower extremity examination does not show any fracture dislocation or blood clot symptoms. Pelvis is stable and the neck and back are stable and nontender. Assessment/Plan We will plan for ORIF left fifth metacarpal fracture with repair reconstruction is necessary.  We are planning surgery for your upper extremity. The risk and benefits of surgery to include risk of bleeding, infection, anesthesia,  damage to normal structures and failure of the surgery to accomplish its intended goals of relieving symptoms and restoring function have been discussed in detail. With this in mind we plan to proceed. I have specifically discussed with the patient the pre-and postoperative regime and the dos and don'ts and risk and benefits in great detail. Risk and benefits of surgery also include risk of dystrophy(CRPS), chronic nerve pain, failure of the healing process to go onto completion and other inherent risks of surgery The relavent the pathophysiology of the disease/injury process, as well as the alternatives for treatment and postoperative course of action has been discussed in great detail with the patient who desires to proceed.  We will do everything in our power to help you (the patient) restore function to the upper extremity. It is a pleasure to see this patient today.   Oletta Cohn III, MD 10/31/2019, 3:10 PM

## 2019-10-31 NOTE — Anesthesia Preprocedure Evaluation (Addendum)
Anesthesia Evaluation  Patient identified by MRN, date of birth, ID band Patient awake    Reviewed: Allergy & Precautions, NPO status , Patient's Chart, lab work & pertinent test results  History of Anesthesia Complications (+) PONV and history of anesthetic complications  Airway Mallampati: II  TM Distance: >3 FB Neck ROM: Full    Dental  (+) Dental Advisory Given, Teeth Intact   Pulmonary neg pulmonary ROS, neg recent URI,    breath sounds clear to auscultation       Cardiovascular hypertension,  Rhythm:Regular     Neuro/Psych  Headaches, negative psych ROS   GI/Hepatic Neg liver ROS, GERD  Medicated and Controlled,  Endo/Other  negative endocrine ROS  Renal/GU negative Renal ROS     Musculoskeletal Left fifth metacarpal fracture   Abdominal   Peds  Hematology negative hematology ROS (+)   Anesthesia Other Findings   Reproductive/Obstetrics                            Anesthesia Physical Anesthesia Plan  ASA: II  Anesthesia Plan: General   Post-op Pain Management:    Induction: Intravenous  PONV Risk Score and Plan: 4 or greater and Ondansetron and Dexamethasone  Airway Management Planned: LMA and Oral ETT  Additional Equipment: None  Intra-op Plan:   Post-operative Plan: Extubation in OR  Informed Consent: I have reviewed the patients History and Physical, chart, labs and discussed the procedure including the risks, benefits and alternatives for the proposed anesthesia with the patient or authorized representative who has indicated his/her understanding and acceptance.     Dental advisory given  Plan Discussed with: CRNA and Surgeon  Anesthesia Plan Comments:         Anesthesia Quick Evaluation

## 2019-11-01 ENCOUNTER — Encounter: Payer: Self-pay | Admitting: *Deleted

## 2019-11-01 NOTE — Progress Notes (Signed)
Wasted fentanyl with Tiffany Kocher RN as a witness.

## 2019-11-02 NOTE — Anesthesia Postprocedure Evaluation (Signed)
Anesthesia Post Note  Patient: Elizabeth Shaffer  Procedure(s) Performed: OPEN REDUCTION INTERNAL FIXATION (ORIF) LEFT FIFTH METACARPAL FRACTURE USING EXSOMED NAIL 4.31mm x 71mm (Left Finger)     Patient location during evaluation: PACU Anesthesia Type: General Level of consciousness: awake and alert Pain management: pain level controlled Vital Signs Assessment: post-procedure vital signs reviewed and stable Respiratory status: spontaneous breathing, nonlabored ventilation, respiratory function stable and patient connected to nasal cannula oxygen Cardiovascular status: blood pressure returned to baseline and stable Postop Assessment: no apparent nausea or vomiting Anesthetic complications: no    Last Vitals:  Vitals:   10/31/19 1915 10/31/19 1930  BP: 114/70 107/65  Pulse: (!) 53 (!) 54  Resp: 12 10  Temp:  36.8 C  SpO2: 99% 99%    Last Pain:  Vitals:   10/31/19 1915  TempSrc:   PainSc: 3                  Rajon Bisig

## 2019-12-21 IMAGING — US US PELVIS COMPLETE WITH TRANSVAGINAL
1 series · 15 of 25 positions shown · non-contrast
Comparison: None

CLINICAL DATA: Initial evaluation for acute abdominal pain.



[Series 1: us pelvis complete with transvaginal · 15 of 46 slices shown]
[im 1/46]
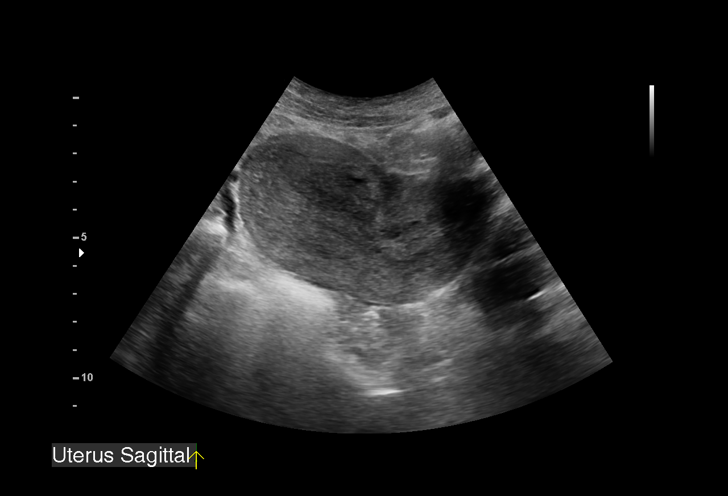
[im 4/46]
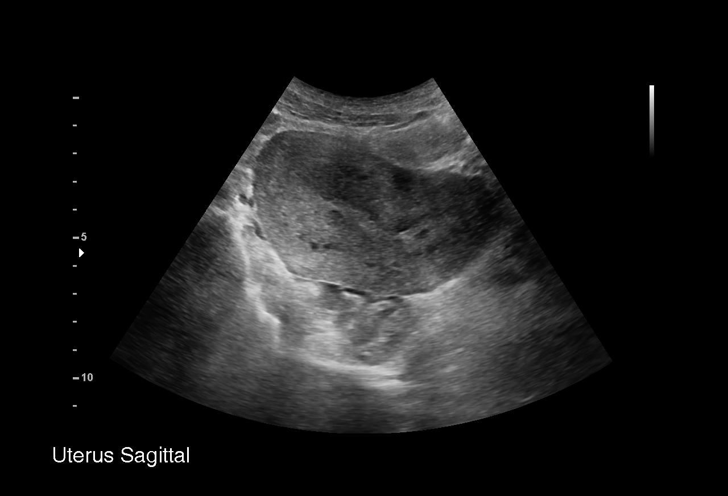
[im 8/46]
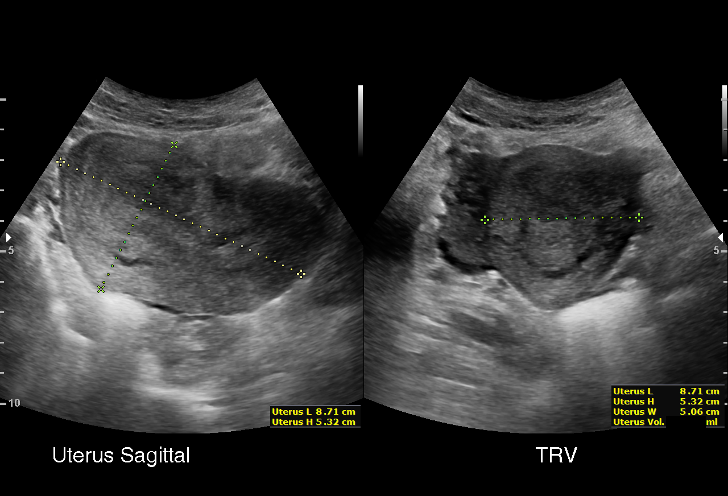
[im 10/46]
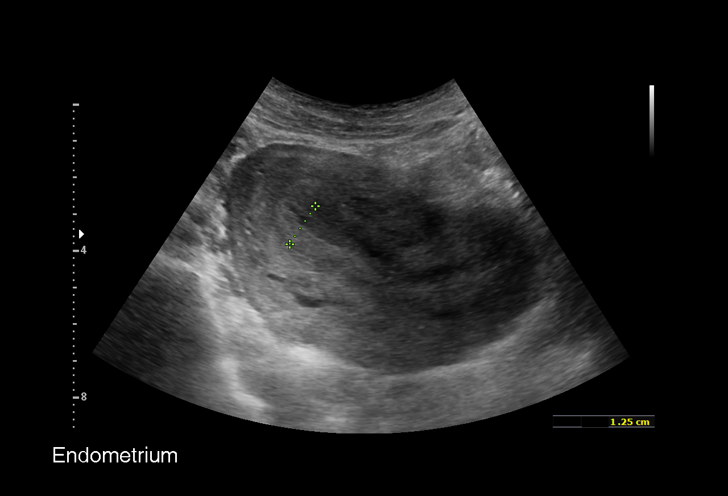
[im 14/46]
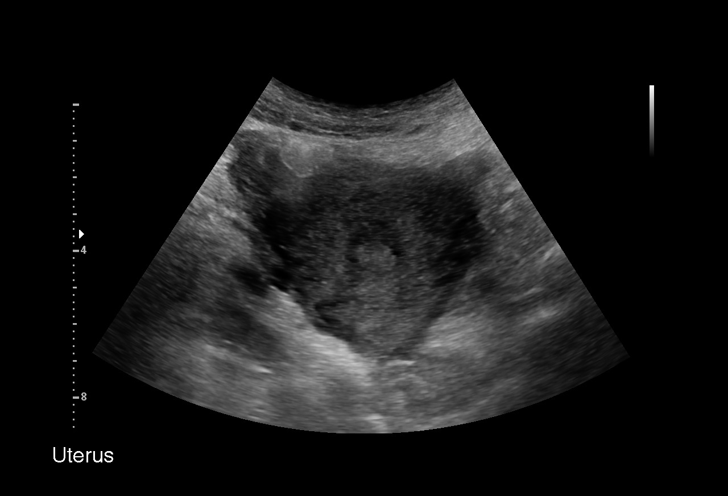
[im 17/46]
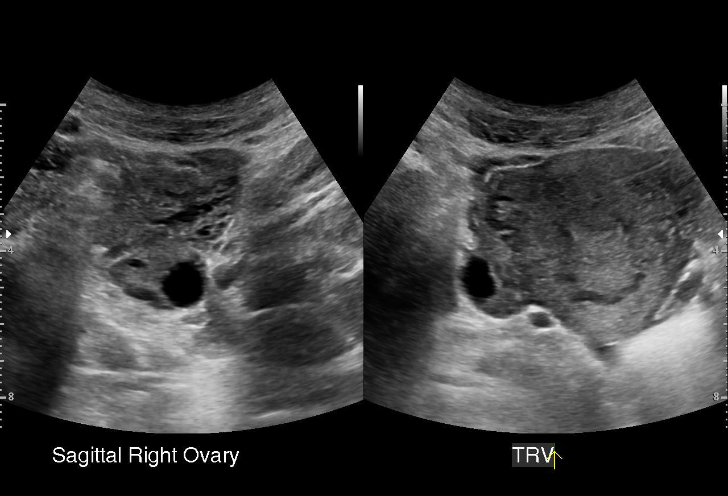
[im 19/46]
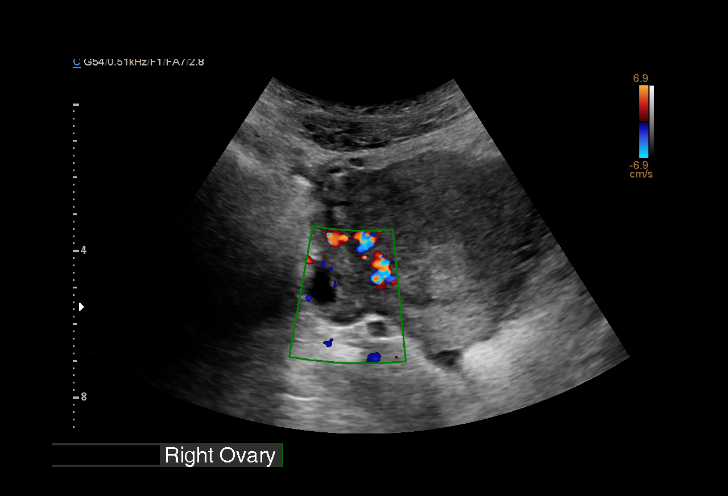
[im 23/46]
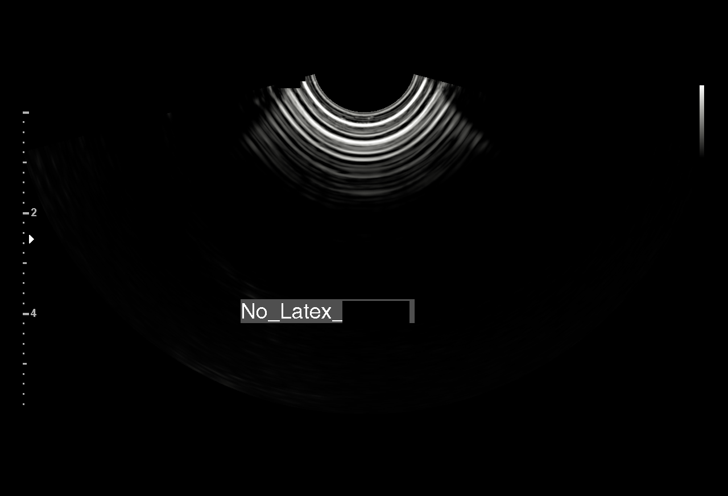
[im 27/46]
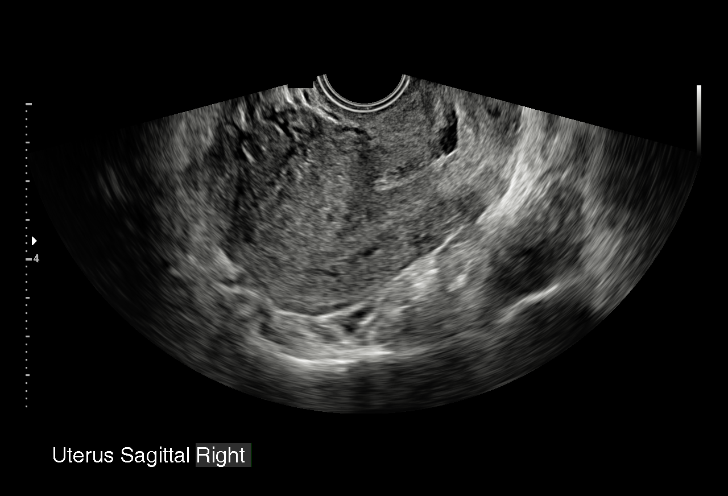
[im 29/46]
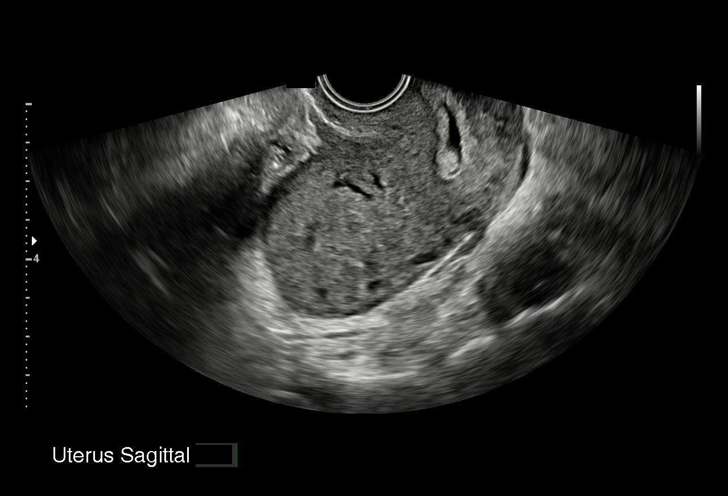
[im 32/46]
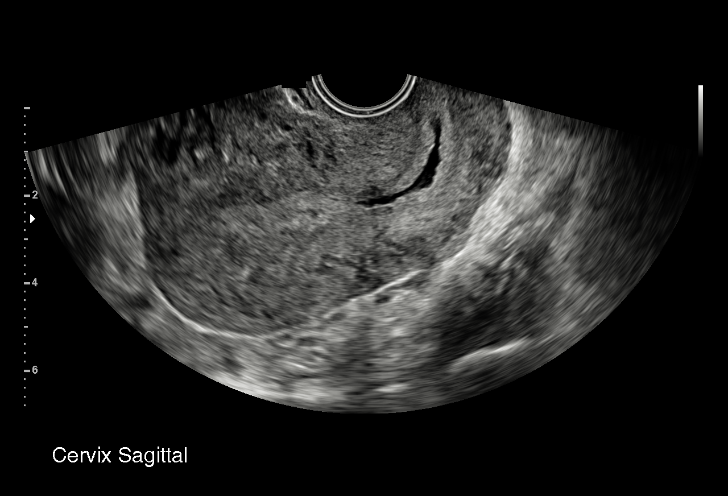
[im 36/46]
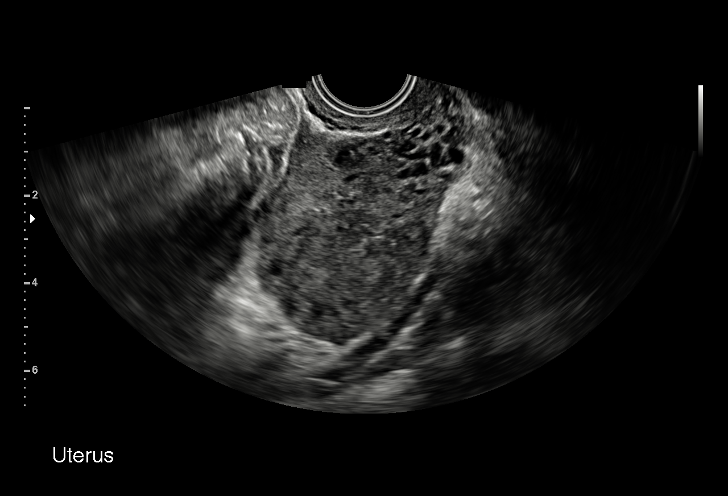
[im 38/46]
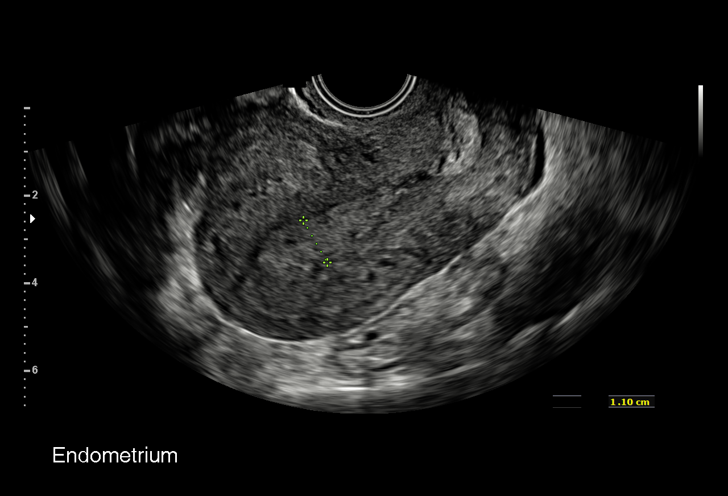
[im 42/46]
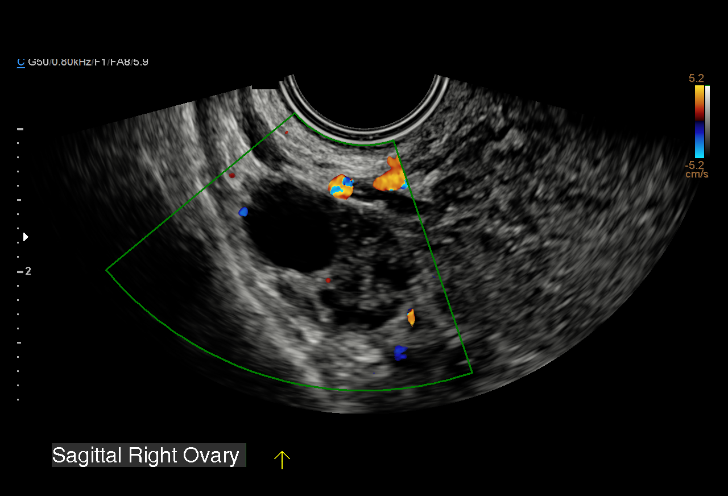
[im 46/46]
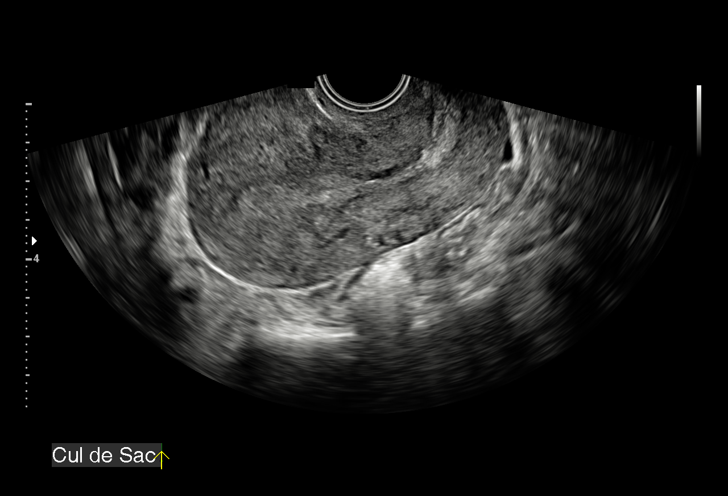

[15 of 25 positions shown; findings below may reference images not displayed]

FINDINGS: Uterus

Measurements: 8.7 x 5.4 x 5.4 cm = volume: 130.8 mL. No fibroids or
other mass visualized.

Endometrium

Thickness: 11.0 mm. No focal abnormality visualized. Small amount of
mildly complex fluid seen within the endocervical canal.

Right ovary

Measurements: 2.8 x 1.8 x 1.9 cm = volume: 5.1 mL. Normal
appearance/no adnexal mass.

Left ovary

Measurements: 2.6 x 1.7 x 1.8 cm = volume: 4.2 mL. Normal
appearance/no adnexal mass.

Other findings

No abnormal free fluid.
IMPRESSION: Normal pelvic ultrasound.  No acute abnormality identified.

## 2020-02-02 ENCOUNTER — Other Ambulatory Visit: Payer: Self-pay

## 2020-02-02 ENCOUNTER — Encounter (HOSPITAL_COMMUNITY): Payer: Self-pay | Admitting: Emergency Medicine

## 2020-02-02 ENCOUNTER — Ambulatory Visit (HOSPITAL_COMMUNITY)
Admission: EM | Admit: 2020-02-02 | Discharge: 2020-02-02 | Disposition: A | Discharge status: Home / Self Care | Payer: Medicaid Other | Attending: Physician Assistant | Admitting: Physician Assistant

## 2020-02-02 DIAGNOSIS — Z79899 Other long term (current) drug therapy: Secondary | ICD-10-CM | POA: Insufficient documentation

## 2020-02-02 DIAGNOSIS — H6690 Otitis media, unspecified, unspecified ear: Secondary | ICD-10-CM | POA: Diagnosis not present

## 2020-02-02 DIAGNOSIS — Z8249 Family history of ischemic heart disease and other diseases of the circulatory system: Secondary | ICD-10-CM | POA: Diagnosis not present

## 2020-02-02 DIAGNOSIS — I10 Essential (primary) hypertension: Secondary | ICD-10-CM | POA: Diagnosis not present

## 2020-02-02 DIAGNOSIS — Z8759 Personal history of other complications of pregnancy, childbirth and the puerperium: Secondary | ICD-10-CM | POA: Diagnosis not present

## 2020-02-02 DIAGNOSIS — Z8719 Personal history of other diseases of the digestive system: Secondary | ICD-10-CM | POA: Insufficient documentation

## 2020-02-02 DIAGNOSIS — Z20822 Contact with and (suspected) exposure to covid-19: Secondary | ICD-10-CM | POA: Diagnosis not present

## 2020-02-02 DIAGNOSIS — Z791 Long term (current) use of non-steroidal anti-inflammatories (NSAID): Secondary | ICD-10-CM | POA: Insufficient documentation

## 2020-02-02 DIAGNOSIS — Z886 Allergy status to analgesic agent status: Secondary | ICD-10-CM | POA: Diagnosis not present

## 2020-02-02 DIAGNOSIS — K219 Gastro-esophageal reflux disease without esophagitis: Secondary | ICD-10-CM | POA: Diagnosis not present

## 2020-02-02 LAB — SARS CORONAVIRUS 2 (TAT 6-24 HRS): SARS Coronavirus 2: NEGATIVE

## 2020-02-02 MED ORDER — AMOXICILLIN 500 MG PO CAPS
500.0000 mg | ORAL_CAPSULE | Freq: Three times a day (TID) | ORAL | 0 refills | Status: DC
Start: 2020-02-02 — End: 2020-02-20

## 2020-02-02 NOTE — ED Triage Notes (Signed)
Pt c/o ear pain in both ears onset yesterday she states she wears an ear piece at work and thinks she might have an ear infection. Pt states she felt nauseous and vomited x 3 yesterday. Pt states she has body aches and hot flashes. She states she works in [employer] and does not know if she has been exposed to covid.

## 2020-02-02 NOTE — ED Provider Notes (Signed)
[facility]    CSN: [id number] Arrival date & time: 02/02/20  7408      History   Chief Complaint Chief Complaint  Patient presents with  . Otalgia    HPI Elizabeth Shaffer is a 24 y.o. female.   The history is provided by the patient. No language interpreter was used.  Otalgia Location:  Right Behind ear:  No abnormality Quality:  Aching Severity:  Moderate Onset quality:  Gradual Duration:  3 days Timing:  Constant Progression:  Worsening Chronicity:  New Relieved by:  Nothing Worsened by:  Nothing Ineffective treatments:  None tried Risk factors: no recent travel     Past Medical History:  Diagnosis Date  . Anemia    during pregnancy  . Displaced fracture of fifth metacarpal bone of left hand   . Gastritis   . GERD (gastroesophageal reflux disease)   . Headache   . HTN (hypertension)   . Migraines   . PONV (postoperative nausea and vomiting)     Patient Active Problem List   Diagnosis Date Noted  . Encounter for annual routine gynecological examination 09/07/2019  . Pap smear for cervical cancer screening 09/07/2019  . History of gestational hypertension 01/09/2019  . GERD (gastroesophageal reflux disease) 07/20/2018    Past Surgical History:  Procedure Laterality Date  . MOUTH SURGERY  2011  . MULTIPLE TOOTH EXTRACTIONS    . OPEN REDUCTION INTERNAL FIXATION (ORIF) METACARPAL Left 10/31/2019   Procedure: OPEN REDUCTION INTERNAL FIXATION (ORIF) LEFT FIFTH METACARPAL FRACTURE USING EXSOMED NAIL 4.44mm x 6mm;  Surgeon: Dominica Severin, MD;  Location: [facility];  Service: Orthopedics;  Laterality: Left;  60 mins  . WISDOM TOOTH EXTRACTION      OB History    Gravida  1   Para  1   Term  1   Preterm  0   AB  0   Living  1     SAB  0   TAB  0   Ectopic  0   Multiple  0   Live Births  1            Home Medications    Prior to Admission medications   Medication Sig Start Date End Date Taking? Authorizing Provider    acetaminophen (TYLENOL) 500 MG tablet Take 2 tablets (1,000 mg total) by mouth every 6 (six) hours as needed for mild pain or moderate pain. 10/20/19   Darr, Veryl Speak, PA-C  ibuprofen (ADVIL) 600 MG tablet Take 1 tablet (600 mg total) by mouth every 6 (six) hours as needed. Patient taking differently: Take 600 mg by mouth every 6 (six) hours as needed for mild pain.  10/20/19   Darr, Veryl Speak, PA-C  MELOXICAM PO Take by mouth. Unsure dose    [provider]  methocarbamol (ROBAXIN) 500 MG tablet Take 1 tablet (500 mg total) by mouth 4 (four) times daily. 10/31/19   Dominica Severin, MD  norgestimate-ethinyl estradiol (ORTHO-CYCLEN) 0.25-35 MG-MCG tablet Take 1 tablet by mouth daily. 09/07/19   Rasch, Victorino Dike I, NP  omeprazole (PRILOSEC) 20 MG capsule Take 1 capsule (20 mg total) by mouth daily. 04/20/19   Esterwood, Amy S, PA-C  oxyCODONE (ROXICODONE) 5 MG immediate release tablet Take 1 tablet (5 mg total) by mouth every 4 (four) hours as needed. 10/31/19 10/30/20  Dominica Severin, MD  promethazine (PHENERGAN) 12.5 MG tablet Take 1 tablet (12.5 mg total) by mouth every 6 (six) hours as needed for nausea or vomiting. 10/31/19  Dominica Severin, MD    Family History Family History  Adopted: Yes  Problem Relation Age of Onset  . Hypertension Mother     Social History Social History   Tobacco Use  . Smoking status: Never Smoker  . Smokeless tobacco: Never Used  Vaping Use  . Vaping Use: Never used  Substance Use Topics  . Alcohol use: Yes    Comment: social  . Drug use: Not Currently    Types: Marijuana    Comment: none since November 2019     Allergies   Ibuprofen   Review of Systems Review of Systems  HENT: Positive for ear pain.   All other systems reviewed and are negative.    Physical Exam Triage Vital Signs ED Triage Vitals [02/02/20 1105]  Enc Vitals Group     BP 126/66     Pulse Rate 68     Resp 16     Temp 98.4 F (36.9 C)     Temp Source Oral     SpO2 99  %     Weight      Height      Head Circumference      Peak Flow      Pain Score      Pain Loc      Pain Edu?      Excl. in GC?    No data found.  Updated Vital Signs BP 126/66 (BP Location: Left Arm)   Pulse 68   Temp 98.4 F (36.9 C) (Oral)   Resp 16   SpO2 99%   Visual Acuity Right Eye Distance:   Left Eye Distance:   Bilateral Distance:    Right Eye Near:   Left Eye Near:    Bilateral Near:     Physical Exam Vitals and nursing note reviewed.  Constitutional:      Appearance: She is well-developed.  HENT:     Head: Normocephalic.     Left Ear: Tympanic membrane normal.     Ears:     Comments: Right tm erythematous, landmarks obscurred     Nose: Nose normal.     Mouth/Throat:     Mouth: Mucous membranes are moist.  Cardiovascular:     Rate and Rhythm: Normal rate.     Pulses: Normal pulses.  Pulmonary:     Effort: Pulmonary effort is normal.  Abdominal:     General: There is distension.  Musculoskeletal:        General: Normal range of motion.     Cervical back: Normal range of motion.  Skin:    General: Skin is warm.  Neurological:     General: No focal deficit present.     Mental Status: She is alert and oriented to person, place, and time.  Psychiatric:        Mood and Affect: Mood normal.      UC Treatments / Results  Labs (all labs ordered are listed, but only abnormal results are displayed) Labs Reviewed  SARS CORONAVIRUS 2 (TAT 6-24 HRS)    EKG   Radiology No results found.  Procedures Procedures (including critical care time)  Medications Ordered in UC Medications - No data to display  Initial Impression / Assessment and Plan / UC Course  I have reviewed the triage vital signs and the nursing notes.  Pertinent labs & imaging results that were available during my care of the patient were reviewed by me and considered in my medical decision making (see chart for  details).     MDM:  Covid ordered and is pending  Final  Clinical Impressions(s) / UC Diagnoses   Final diagnoses:  Acute otitis media, unspecified otitis media type   Discharge Instructions   None    ED Prescriptions    None     PDMP not reviewed this encounter.  An After Visit Summary was printed and given to the patient.   Elson Areas, [place] 02/02/20 1111

## 2020-02-04 ENCOUNTER — Other Ambulatory Visit: Payer: Self-pay

## 2020-02-04 ENCOUNTER — Inpatient Hospital Stay (HOSPITAL_COMMUNITY)
Admission: AD | Admit: 2020-02-04 | Discharge: 2020-02-04 | Disposition: A | Discharge status: Home / Self Care | Payer: Medicaid Other | Attending: Obstetrics and Gynecology | Admitting: Obstetrics and Gynecology

## 2020-02-04 ENCOUNTER — Encounter (HOSPITAL_COMMUNITY): Payer: Self-pay | Admitting: Obstetrics and Gynecology

## 2020-02-04 ENCOUNTER — Inpatient Hospital Stay (HOSPITAL_COMMUNITY): Payer: Medicaid Other

## 2020-02-04 DIAGNOSIS — O10911 Unspecified pre-existing hypertension complicating pregnancy, first trimester: Secondary | ICD-10-CM | POA: Diagnosis not present

## 2020-02-04 DIAGNOSIS — O219 Vomiting of pregnancy, unspecified: Secondary | ICD-10-CM

## 2020-02-04 DIAGNOSIS — Z8249 Family history of ischemic heart disease and other diseases of the circulatory system: Secondary | ICD-10-CM | POA: Insufficient documentation

## 2020-02-04 DIAGNOSIS — O26891 Other specified pregnancy related conditions, first trimester: Secondary | ICD-10-CM | POA: Insufficient documentation

## 2020-02-04 DIAGNOSIS — Z79899 Other long term (current) drug therapy: Secondary | ICD-10-CM | POA: Diagnosis not present

## 2020-02-04 DIAGNOSIS — K219 Gastro-esophageal reflux disease without esophagitis: Secondary | ICD-10-CM | POA: Diagnosis not present

## 2020-02-04 DIAGNOSIS — O3680X0 Pregnancy with inconclusive fetal viability, not applicable or unspecified: Secondary | ICD-10-CM | POA: Diagnosis not present

## 2020-02-04 DIAGNOSIS — Z791 Long term (current) use of non-steroidal anti-inflammatories (NSAID): Secondary | ICD-10-CM | POA: Diagnosis not present

## 2020-02-04 DIAGNOSIS — R109 Unspecified abdominal pain: Secondary | ICD-10-CM | POA: Diagnosis not present

## 2020-02-04 DIAGNOSIS — O210 Mild hyperemesis gravidarum: Secondary | ICD-10-CM

## 2020-02-04 DIAGNOSIS — Z3A01 Less than 8 weeks gestation of pregnancy: Secondary | ICD-10-CM

## 2020-02-04 DIAGNOSIS — O26899 Other specified pregnancy related conditions, unspecified trimester: Secondary | ICD-10-CM

## 2020-02-04 DIAGNOSIS — Z886 Allergy status to analgesic agent status: Secondary | ICD-10-CM | POA: Diagnosis not present

## 2020-02-04 DIAGNOSIS — Z8719 Personal history of other diseases of the digestive system: Secondary | ICD-10-CM | POA: Diagnosis not present

## 2020-02-04 DIAGNOSIS — O99611 Diseases of the digestive system complicating pregnancy, first trimester: Secondary | ICD-10-CM | POA: Insufficient documentation

## 2020-02-04 LAB — WET PREP, GENITAL
Sperm: NONE SEEN
Trich, Wet Prep: NONE SEEN
Yeast Wet Prep HPF POC: NONE SEEN

## 2020-02-04 LAB — BASIC METABOLIC PANEL
Anion gap: 7 (ref 5–15)
BUN: 9 mg/dL (ref 6–20)
CO2: 25 mmol/L (ref 22–32)
Calcium: 9.4 mg/dL (ref 8.9–10.3)
Chloride: 109 mmol/L (ref 98–111)
Creatinine, Ser: 0.81 mg/dL (ref 0.44–1.00)
GFR calc Af Amer: >60 mL/min (ref >60)
GFR calc non Af Amer: >60 mL/min (ref >60)
Glucose, Bld: 95 mg/dL (ref 70–99)
Potassium: 4.5 mmol/L (ref 3.5–5.1)
Sodium: 141 mmol/L (ref 135–145)

## 2020-02-04 LAB — URINALYSIS, ROUTINE W REFLEX MICROSCOPIC
Bilirubin Urine: NEGATIVE
Glucose, UA: NEGATIVE mg/dL
Hgb urine dipstick: NEGATIVE
Ketones, ur: NEGATIVE mg/dL
Leukocytes,Ua: NEGATIVE
Nitrite: NEGATIVE
Protein, ur: NEGATIVE mg/dL
Specific Gravity, Urine: 1.031 — ABNORMAL HIGH (ref 1.005–1.030)
pH: 6 (ref 5.0–8.0)

## 2020-02-04 LAB — CBC
HCT: 38 % (ref 36.0–46.0)
Hemoglobin: 12.8 g/dL (ref 12.0–15.0)
MCH: 31.2 pg (ref 26.0–34.0)
MCHC: 33.7 g/dL (ref 30.0–36.0)
MCV: 92.7 fL (ref 80.0–100.0)
Platelets: 238 10*3/uL (ref 150–400)
RBC: 4.1 MIL/uL (ref 3.87–5.11)
RDW: 12 % (ref 11.5–15.5)
WBC: 7.4 10*3/uL (ref 4.0–10.5)
nRBC: 0 % (ref 0.0–0.2)

## 2020-02-04 LAB — POCT PREGNANCY, URINE: Preg Test, Ur: POSITIVE — AB

## 2020-02-04 LAB — HCG, QUANTITATIVE, PREGNANCY: hCG, Beta Chain, Quant, S: 88 m[IU]/mL — ABNORMAL HIGH (ref <5)

## 2020-02-04 MED ORDER — FAMOTIDINE 20 MG PO TABS
20.0000 mg | ORAL_TABLET | Freq: Once | ORAL | Status: AC
  Administered 2020-02-04: 20 mg via ORAL
  Filled 2020-02-04: qty 1

## 2020-02-04 MED ORDER — PROMETHAZINE HCL 25 MG PO TABS
25.0000 mg | ORAL_TABLET | Freq: Four times a day (QID) | ORAL | Status: DC | PRN
  Administered 2020-02-04: 25 mg via ORAL
  Filled 2020-02-04: qty 1

## 2020-02-04 MED ORDER — PROMETHAZINE HCL 25 MG PO TABS: 12.5000 mg | ORAL_TABLET | Freq: Four times a day (QID) | ORAL | 1 refills | Status: DC | PRN

## 2020-02-04 NOTE — MAU Note (Signed)
Elizabeth Shaffer is a 24 y.o. at Unknown here in MAU reporting: having diarrhea and nausea for about a  week. LMP: 01-01-2020  Pain score: 5 Vitals:   02/04/20 1920  BP: 120/75  Pulse: 64  Resp: 16  Temp: 98.9 F (37.2 C)  SpO2: 100%

## 2020-02-04 NOTE — Discharge Instructions (Signed)

## 2020-02-04 NOTE — MAU Provider Note (Signed)
History     CSN: [id number]  Arrival date and time: 02/04/20 1839   First Provider Initiated Contact with Patient 02/04/20 2013      Chief Complaint  Patient presents with  . Nausea  . Diarrhea   24 y.o. G2P1001 @[redacted]w[redacted]d  by LMP presenting with nausea and loose stools. Had one episode of emesis last week. Had some water and soda today, no food. Hx of GERD, takes Prilosec. Loose stools have been ongoing for 3 weeks. Denies watery stools. She thinks she saw blood in the stool a few days ago. Also reports 1 weeks of LAP. Describes as constant and cramping. Rates pain 5/10. Has not taken anything for it. Denies VB or discharge. Had a 101 fever last week. Denies sick contacts.   OB History    Gravida  2   Para  1   Term  1   Preterm  0   AB  0   Living  1     SAB  0   TAB  0   Ectopic  0   Multiple  0   Live Births  1           Past Medical History:  Diagnosis Date  . Anemia    during pregnancy  . Displaced fracture of fifth metacarpal bone of left hand   . Gastritis   . GERD (gastroesophageal reflux disease)   . Headache   . HTN (hypertension)   . Migraines   . PONV (postoperative nausea and vomiting)     Past Surgical History:  Procedure Laterality Date  . hand     carpule tunnel  . MOUTH SURGERY  2011  . MULTIPLE TOOTH EXTRACTIONS    . OPEN REDUCTION INTERNAL FIXATION (ORIF) METACARPAL Left 10/31/2019   Procedure: OPEN REDUCTION INTERNAL FIXATION (ORIF) LEFT FIFTH METACARPAL FRACTURE USING EXSOMED NAIL 4.7mm x 10mm;  Surgeon: 43m, MD;  Location: [facility];  Service: Orthopedics;  Laterality: Left;  60 mins  . WISDOM TOOTH EXTRACTION      Family History  Adopted: Yes  Problem Relation Age of Onset  . Hypertension Mother     Social History   Tobacco Use  . Smoking status: Never Smoker  . Smokeless tobacco: Never Used  Vaping Use  . Vaping Use: Never used  Substance Use Topics  . Alcohol use: Yes    Comment: social  . Drug use: Yes     Types: Marijuana    Comment: plans to quit    Allergies:  Allergies  Allergen Reactions  . Ibuprofen     Gastritis     Medications Prior to Admission  Medication Sig Dispense Refill Last Dose  . amoxicillin (AMOXIL) 500 MG capsule Take 1 capsule (500 mg total) by mouth 3 (three) times daily. 30 capsule 0 02/03/2020 at Unknown time  . MELOXICAM PO Take by mouth. Unsure dose   Past Week at Unknown time  . omeprazole (PRILOSEC) 20 MG capsule Take 1 capsule (20 mg total) by mouth daily. 30 capsule 11 02/03/2020 at Unknown time  . acetaminophen (TYLENOL) 500 MG tablet Take 2 tablets (1,000 mg total) by mouth every 6 (six) hours as needed for mild pain or moderate pain. 30 tablet 0   . ibuprofen (ADVIL) 600 MG tablet Take 1 tablet (600 mg total) by mouth every 6 (six) hours as needed. (Patient taking differently: Take 600 mg by mouth every 6 (six) hours as needed for mild pain. ) 30 tablet 0   .  methocarbamol (ROBAXIN) 500 MG tablet Take 1 tablet (500 mg total) by mouth 4 (four) times daily. 30 tablet 0   . norgestimate-ethinyl estradiol (ORTHO-CYCLEN) 0.25-35 MG-MCG tablet Take 1 tablet by mouth daily. 1 Package 2   . oxyCODONE (ROXICODONE) 5 MG immediate release tablet Take 1 tablet (5 mg total) by mouth every 4 (four) hours as needed. 40 tablet 0   . promethazine (PHENERGAN) 12.5 MG tablet Take 1 tablet (12.5 mg total) by mouth every 6 (six) hours as needed for nausea or vomiting. 30 tablet 0     Review of Systems  Constitutional: Negative for chills and fever.  Gastrointestinal: Positive for diarrhea, nausea and vomiting. Negative for constipation.  Genitourinary: Negative for dysuria, frequency, urgency, vaginal bleeding and vaginal discharge.   Physical Exam   Blood pressure 120/75, pulse 64, temperature 98.9 F (37.2 C), temperature source Oral, resp. rate 16, weight 51.7 kg, last menstrual period 01/01/2020, SpO2 100 %, not currently breastfeeding.  Physical Exam Vitals and  nursing note reviewed. Exam conducted with a chaperone present.  Constitutional:      Appearance: Normal appearance.  HENT:     Head: Normocephalic and atraumatic.  Cardiovascular:     Rate and Rhythm: Normal rate.  Pulmonary:     Effort: Pulmonary effort is normal. No respiratory distress.  Abdominal:     General: There is no distension.     Palpations: Abdomen is soft.     Tenderness: There is no abdominal tenderness. There is no guarding or rebound.  Genitourinary:    Comments: External: no lesions or erythema Uterus: non enlarged, anteverted, non tender, no CMT Adnexae: no masses, no tenderness left, no tenderness right Cervix closed  Musculoskeletal:        General: Normal range of motion.     Cervical back: Normal range of motion.  Skin:    General: Skin is warm and dry.  Neurological:     General: No focal deficit present.     Mental Status: She is alert and oriented to person, place, and time.  Psychiatric:        Mood and Affect: Mood normal.    Results for orders placed or performed during the hospital encounter of 02/04/20 (from the past 24 hour(s))  Urinalysis, Routine w reflex microscopic Urine, Clean Catch     Status: Abnormal   Collection Time: 02/04/20  7:13 PM  Result Value Ref Range   Color, Urine YELLOW YELLOW   APPearance CLEAR CLEAR   Specific Gravity, Urine 1.031 (H) 1.005 - 1.030   pH 6.0 5.0 - 8.0   Glucose, UA NEGATIVE NEGATIVE mg/dL   Hgb urine dipstick NEGATIVE NEGATIVE   Bilirubin Urine NEGATIVE NEGATIVE   Ketones, ur NEGATIVE NEGATIVE mg/dL   Protein, ur NEGATIVE NEGATIVE mg/dL   Nitrite NEGATIVE NEGATIVE   Leukocytes,Ua NEGATIVE NEGATIVE  Pregnancy, urine POC     Status: Abnormal   Collection Time: 02/04/20  7:14 PM  Result Value Ref Range   Preg Test, Ur POSITIVE (A) NEGATIVE  Wet prep, genital     Status: Abnormal   Collection Time: 02/04/20  8:34 PM   Specimen: Vaginal  Result Value Ref Range   Yeast Wet Prep HPF POC NONE SEEN  NONE SEEN   Trich, Wet Prep NONE SEEN NONE SEEN   Clue Cells Wet Prep HPF POC PRESENT (A) NONE SEEN   WBC, Wet Prep HPF POC MANY (A) NONE SEEN   Sperm NONE SEEN   Basic metabolic panel  Status: None   Collection Time: 02/04/20  8:41 PM  Result Value Ref Range   Sodium 141 135 - 145 mmol/L   Potassium 4.5 3.5 - 5.1 mmol/L   Chloride 109 98 - 111 mmol/L   CO2 25 22 - 32 mmol/L   Glucose, Bld 95 70 - 99 mg/dL   BUN 9 6 - 20 mg/dL   Creatinine, Ser 0.86 0.44 - 1.00 mg/dL   Calcium 9.4 8.9 - 57.8 mg/dL   GFR calc non Af Amer >60 >60 mL/min   GFR calc Af Amer >60 >60 mL/min   Anion gap 7 5 - 15  CBC     Status: None   Collection Time: 02/04/20  8:41 PM  Result Value Ref Range   WBC 7.4 4.0 - 10.5 K/uL   RBC 4.10 3.87 - 5.11 MIL/uL   Hemoglobin 12.8 12.0 - 15.0 g/dL   HCT 46.9 36 - 46 %   MCV 92.7 80.0 - 100.0 fL   MCH 31.2 26.0 - 34.0 pg   MCHC 33.7 30.0 - 36.0 g/dL   RDW 62.9 52.8 - 41.3 %   Platelets 238 150 - 400 K/uL   nRBC 0.0 0.0 - 0.2 %  hCG, quantitative, pregnancy     Status: Abnormal   Collection Time: 02/04/20  8:41 PM  Result Value Ref Range   hCG, Beta Chain, Quant, S 88 (H) <5 mIU/mL   US OB LESS THAN 14 WEEKS WITH OB TRANSVAGINAL  Result Date: 02/04/2020 CLINICAL DATA:  Abdominal pain EXAM: OBSTETRIC <14 WK [place] AND TRANSVAGINAL OB US TECHNIQUE: Both transabdominal and transvaginal ultrasound examinations were performed for complete evaluation of the gestation as well as the maternal uterus, adnexal regions, and pelvic cul-de-sac. Transvaginal technique was performed to assess early pregnancy. COMPARISON:  None. FINDINGS: Intrauterine gestational sac: None Yolk sac:  Not Visualized. Embryo:  Not Visualized. Maternal uterus/adnexae: A small cystic area within the endometrium measuring 0.2 x 0.1 x 0.2 cm could reflect a over early gestational sac, but is too small to characterize at this time. Endometrium measures up to 12 mm in thickness. No uterine masses. Right ovary  measures 3.2 x 1.4 x 1.9 cm and the left ovary measures 2.9 x 1.6 x 1.7 cm. Normal follicles are seen bilaterally. No adnexal masses. No free fluid. IMPRESSION: 1. Small cystic area within the endometrium which could reflect a very early gestational sac. However, this is too small to characterize at this time. Recommend follow-up quantitative B-HCG levels and follow-up US in 14 days to assess viability. This recommendation follows SRU consensus guidelines: Diagnostic Criteria for Nonviable Pregnancy Early in the First Trimester. Malva Limes Med 2013; 244:0102-72. 2. Otherwise unremarkable pelvic ultrasound. Electronically Signed   By: Sharlet Salina M.D.   On: 02/04/2020 21:47   MAU Course  Procedures Phenergan Pepcid  MDM Labs and [place] ordered and reviewed.  Nausea improved, tolerating po, no emesis. No IUP or adnexal mass seen on [place], findings could indicate early pregnancy, ectopic pregnancy, or failed pregnancy-discussed with pt. Will follow quant in 48 hrs. Stable for discharge home.  Assessment and Plan   1. Pregnancy, location unknown   2. Abdominal pain affecting pregnancy   3. Morning sickness    Discharge home Follow up at [facility] on 02/07/20 @0900  Ectopic/SAB precautions Rx Phenergan  Allergies as of 02/04/2020      Reactions   Ibuprofen    Gastritis       Medication List    STOP  taking these medications   ibuprofen 600 MG tablet Commonly known as: ADVIL   MELOXICAM PO   methocarbamol 500 MG tablet Commonly known as: Robaxin   norgestimate-ethinyl estradiol 0.25-35 MG-MCG tablet Commonly known as: ORTHO-CYCLEN   oxyCODONE 5 MG immediate release tablet Commonly known as: Roxicodone     TAKE these medications   acetaminophen 500 MG tablet Commonly known as: TYLENOL Take 2 tablets (1,000 mg total) by mouth every 6 (six) hours as needed for mild pain or moderate pain.   amoxicillin 500 MG capsule Commonly known as: AMOXIL Take 1 capsule (500 mg total) by mouth 3 (three)  times daily.   omeprazole 20 MG capsule Commonly known as: PRILOSEC Take 1 capsule (20 mg total) by mouth daily.   promethazine 25 MG tablet Commonly known as: PHENERGAN Take 0.5-1 tablets (12.5-25 mg total) by mouth every 6 (six) hours as needed for nausea or vomiting. What changed:   medication strength  how much to take      Donette Larry, CNM 02/04/2020, 8:25 PM

## 2020-02-05 LAB — GC/CHLAMYDIA PROBE AMP (~~LOC~~) NOT AT ARMC
Chlamydia: NEGATIVE
Comment: NEGATIVE
Comment: NORMAL
Neisseria Gonorrhea: NEGATIVE

## 2020-02-06 ENCOUNTER — Encounter (HOSPITAL_COMMUNITY): Payer: Self-pay | Admitting: Obstetrics and Gynecology

## 2020-02-06 ENCOUNTER — Inpatient Hospital Stay (HOSPITAL_COMMUNITY)
Admission: AD | Admit: 2020-02-06 | Discharge: 2020-02-06 | Disposition: A | Discharge status: Home / Self Care | Payer: Medicaid Other | Attending: Obstetrics and Gynecology | Admitting: Obstetrics and Gynecology

## 2020-02-06 ENCOUNTER — Other Ambulatory Visit: Payer: Self-pay

## 2020-02-06 DIAGNOSIS — Z886 Allergy status to analgesic agent status: Secondary | ICD-10-CM | POA: Insufficient documentation

## 2020-02-06 DIAGNOSIS — Z3A01 Less than 8 weeks gestation of pregnancy: Secondary | ICD-10-CM | POA: Diagnosis not present

## 2020-02-06 DIAGNOSIS — K219 Gastro-esophageal reflux disease without esophagitis: Secondary | ICD-10-CM | POA: Insufficient documentation

## 2020-02-06 DIAGNOSIS — O039 Complete or unspecified spontaneous abortion without complication: Secondary | ICD-10-CM | POA: Diagnosis present

## 2020-02-06 DIAGNOSIS — O161 Unspecified maternal hypertension, first trimester: Secondary | ICD-10-CM | POA: Insufficient documentation

## 2020-02-06 DIAGNOSIS — O26891 Other specified pregnancy related conditions, first trimester: Secondary | ICD-10-CM | POA: Insufficient documentation

## 2020-02-06 DIAGNOSIS — Z8719 Personal history of other diseases of the digestive system: Secondary | ICD-10-CM | POA: Diagnosis not present

## 2020-02-06 DIAGNOSIS — Z79899 Other long term (current) drug therapy: Secondary | ICD-10-CM | POA: Insufficient documentation

## 2020-02-06 DIAGNOSIS — Z8249 Family history of ischemic heart disease and other diseases of the circulatory system: Secondary | ICD-10-CM | POA: Diagnosis not present

## 2020-02-06 LAB — HCG, QUANTITATIVE, PREGNANCY: hCG, Beta Chain, Quant, S: 47 m[IU]/mL — ABNORMAL HIGH (ref <5)

## 2020-02-06 LAB — URINALYSIS, ROUTINE W REFLEX MICROSCOPIC
Bacteria, UA: NONE SEEN
Bilirubin Urine: NEGATIVE
Glucose, UA: NEGATIVE mg/dL
Ketones, ur: NEGATIVE mg/dL
Leukocytes,Ua: NEGATIVE
Nitrite: NEGATIVE
Protein, ur: NEGATIVE mg/dL
Specific Gravity, Urine: 1.023 (ref 1.005–1.030)
pH: 6 (ref 5.0–8.0)

## 2020-02-06 LAB — CBC
HCT: 40.3 % (ref 36.0–46.0)
Hemoglobin: 13.9 g/dL (ref 12.0–15.0)
MCH: 32 pg (ref 26.0–34.0)
MCHC: 34.5 g/dL (ref 30.0–36.0)
MCV: 92.6 fL (ref 80.0–100.0)
Platelets: 237 10*3/uL (ref 150–400)
RBC: 4.35 MIL/uL (ref 3.87–5.11)
RDW: 11.9 % (ref 11.5–15.5)
WBC: 5 10*3/uL (ref 4.0–10.5)
nRBC: 0 % (ref 0.0–0.2)

## 2020-02-06 NOTE — MAU Provider Note (Signed)
History     CSN: [id number]  Arrival date and time: 02/06/20 6568   First Provider Initiated Contact with Patient 02/06/20 1040      Chief Complaint  Patient presents with  . Back Pain  . Vaginal Bleeding   Elizabeth Shaffer is a 24 y.o. G2P1001 at [redacted]w[redacted]d who presents today with vaginal bleeding. She was seen here on 02/04/2020 and diagnosed with pregnancy of unknown location. She was to have a follow up HCG on 02/07/2020 at the [facility] however she is here today with increased vaginal bleeding and pain.   Back Pain Associated symptoms include pelvic pain.  Vaginal Bleeding The patient's primary symptoms include pelvic pain and vaginal bleeding. This is a new problem. The current episode started in the past 7 days. The problem occurs intermittently. The problem has been gradually worsening. Associated symptoms include back pain. The vaginal discharge was bloody. The vaginal bleeding is spotting. She has been passing clots (smaller than a sunflower seed ). She has not been passing tissue. Nothing aggravates the symptoms.    OB History    Gravida  2   Para  1   Term  1   Preterm  0   AB  0   Living  1     SAB  0   TAB  0   Ectopic  0   Multiple  0   Live Births  1           Past Medical History:  Diagnosis Date  . Anemia    during pregnancy  . Displaced fracture of fifth metacarpal bone of left hand   . Gastritis   . GERD (gastroesophageal reflux disease)   . Headache   . HTN (hypertension)   . Migraines   . PONV (postoperative nausea and vomiting)     Past Surgical History:  Procedure Laterality Date  . hand     carpule tunnel  . MOUTH SURGERY  2011  . MULTIPLE TOOTH EXTRACTIONS    . OPEN REDUCTION INTERNAL FIXATION (ORIF) METACARPAL Left 10/31/2019   Procedure: OPEN REDUCTION INTERNAL FIXATION (ORIF) LEFT FIFTH METACARPAL FRACTURE USING EXSOMED NAIL 4.11mm x 22mm;  Surgeon: Dominica Severin, MD;  Location: [facility] OR;  Service: Orthopedics;  Laterality: Left;  60  mins  . WISDOM TOOTH EXTRACTION      Family History  Adopted: Yes  Problem Relation Age of Onset  . Hypertension Mother     Social History   Tobacco Use  . Smoking status: Never Smoker  . Smokeless tobacco: Never Used  Vaping Use  . Vaping Use: Never used  Substance Use Topics  . Alcohol use: Yes    Comment: social  . Drug use: Yes    Types: Marijuana    Comment: plans to quit    Allergies:  Allergies  Allergen Reactions  . Ibuprofen     Gastritis     Medications Prior to Admission  Medication Sig Dispense Refill Last Dose  . acetaminophen (TYLENOL) 500 MG tablet Take 2 tablets (1,000 mg total) by mouth every 6 (six) hours as needed for mild pain or moderate pain. 30 tablet 0   . amoxicillin (AMOXIL) 500 MG capsule Take 1 capsule (500 mg total) by mouth 3 (three) times daily. 30 capsule 0   . omeprazole (PRILOSEC) 20 MG capsule Take 1 capsule (20 mg total) by mouth daily. 30 capsule 11   . promethazine (PHENERGAN) 25 MG tablet Take 0.5-1 tablets (12.5-25 mg total) by mouth every 6 (  six) hours as needed for nausea or vomiting. 30 tablet 1     Review of Systems  Genitourinary: Positive for pelvic pain and vaginal bleeding.  Musculoskeletal: Positive for back pain.   Physical Exam   Blood pressure 113/73, pulse 62, temperature 98.4 F (36.9 C), temperature source Oral, resp. rate 16, last menstrual period 01/01/2020, SpO2 100 %, not currently breastfeeding.  Physical Exam Vitals and nursing note reviewed.  Constitutional:      General: She is not in acute distress. HENT:     Head: Normocephalic.  Cardiovascular:     Rate and Rhythm: Normal rate.  Pulmonary:     Effort: Pulmonary effort is normal.  Abdominal:     General: There is no distension.     Palpations: Abdomen is soft.  Skin:    General: Skin is warm and dry.  Neurological:     Mental Status: She is alert and oriented to person, place, and time.  Psychiatric:        Mood and Affect: Mood normal.         Behavior: Behavior normal.      Results for WAVA, KILDOW (MRN [id number]) as of 02/06/2020 12:18  Ref. Range 02/04/2020 20:41 02/04/2020 21:31 02/06/2020 10:17  HCG, Beta Chain, Quant, S Latest Ref Range: <5 mIU/mL 88 (H)  47 (H)    Results for orders placed or performed during the hospital encounter of 02/06/20 (from the past 24 hour(s))  CBC     Status: None   Collection Time: 02/06/20 10:17 AM  Result Value Ref Range   WBC 5.0 4.0 - 10.5 K/uL   RBC 4.35 3.87 - 5.11 MIL/uL   Hemoglobin 13.9 12.0 - 15.0 g/dL   HCT 84.1 36 - 46 %   MCV 92.6 80.0 - 100.0 fL   MCH 32.0 26.0 - 34.0 pg   MCHC 34.5 30.0 - 36.0 g/dL   RDW 66.0 63.0 - 16.0 %   Platelets 237 150 - 400 K/uL   nRBC 0.0 0.0 - 0.2 %  hCG, quantitative, pregnancy     Status: Abnormal   Collection Time: 02/06/20 10:17 AM  Result Value Ref Range   hCG, Beta Chain, Quant, S 47 (H) <5 mIU/mL  Urinalysis, Routine w reflex microscopic Urine, Clean Catch     Status: Abnormal   Collection Time: 02/06/20 10:23 AM  Result Value Ref Range   Color, Urine YELLOW YELLOW   APPearance CLEAR CLEAR   Specific Gravity, Urine 1.023 1.005 - 1.030   pH 6.0 5.0 - 8.0   Glucose, UA NEGATIVE NEGATIVE mg/dL   Hgb urine dipstick LARGE (A) NEGATIVE   Bilirubin Urine NEGATIVE NEGATIVE   Ketones, ur NEGATIVE NEGATIVE mg/dL   Protein, ur NEGATIVE NEGATIVE mg/dL   Nitrite NEGATIVE NEGATIVE   Leukocytes,Ua NEGATIVE NEGATIVE   RBC / HPF 0-5 0 - 5 RBC/hpf   WBC, UA 0-5 0 - 5 WBC/hpf   Bacteria, UA NONE SEEN NONE SEEN   Squamous Epithelial / LPF 6-10 0 - 5   Mucus PRESENT     MAU Course  Procedures  MDM Patient with HCG decrease by approx 50% in less than 48 hours indicating SAB. Will change appts in the clinic for one week HCG and 2 week SAB follow up with provider. Emotional support provided today.   Assessment and Plan   1. SAB (spontaneous abortion)   2. [redacted] weeks gestation of pregnancy    DC home Comfort measures reviewed   Bleeding precautions RX:  none Return to MAU as needed FU with OB as planned   Follow-up Information    [facility] for [facility] Follow up.   Specialty: Obstetrics and Gynecology Contact information: [address] [place] [address] [phone]             Thressa Sheller DNP, CNM  02/06/20  12:24 PM

## 2020-02-06 NOTE — Discharge Instructions (Signed)

## 2020-02-06 NOTE — MAU Note (Signed)
Elizabeth Shaffer is a 24 y.o. at [redacted]w[redacted]d here in MAU reporting: started spotting last night with small clots. This AM bleeding was a little bit heavier, states it was similar to a menstrual cycle. States bleeding seems less in MAU. Having some back pain.  Onset of complaint: last night  Pain score: 10/10  Vitals:   02/06/20 1012  BP: 113/73  Pulse: 62  Resp: 16  Temp: 98.4 F (36.9 C)  SpO2: 100%     Lab orders placed from triage: UA

## 2020-02-07 ENCOUNTER — Other Ambulatory Visit: Payer: Medicaid Other

## 2020-02-14 ENCOUNTER — Encounter: Payer: Self-pay | Admitting: Lactation Services

## 2020-02-14 ENCOUNTER — Ambulatory Visit (INDEPENDENT_AMBULATORY_CARE_PROVIDER_SITE_OTHER): Payer: Medicaid Other | Admitting: Lactation Services

## 2020-02-14 ENCOUNTER — Other Ambulatory Visit: Payer: Self-pay

## 2020-02-14 VITALS — BP 116/74 | HR 72 | Ht 60.5 in | Wt 113.3 lb

## 2020-02-14 DIAGNOSIS — O039 Complete or unspecified spontaneous abortion without complication: Secondary | ICD-10-CM | POA: Diagnosis not present

## 2020-02-14 LAB — BETA HCG QUANT (REF LAB): hCG Quant: 2 m[IU]/mL

## 2020-02-14 NOTE — Progress Notes (Signed)
Patient reports she has continues back pain. She is having a decrease in bleeding. She is having an intermittent headache that is severe when present. She is having Nausea and taking Omeprazole.   Stat Hcg obtained. Informed patient that results may not be back until tomorrow and will be called with results and recommendation. Will follow up on 9/7 for in person visit.

## 2020-02-14 NOTE — Progress Notes (Signed)
Agree with A & P. 

## 2020-02-14 NOTE — Addendum Note (Signed)
Addended by: Ed Blalock on: 02/14/2020 02:45 PM   Modules accepted: Level of Service

## 2020-02-15 ENCOUNTER — Telehealth (INDEPENDENT_AMBULATORY_CARE_PROVIDER_SITE_OTHER): Payer: Medicaid Other | Admitting: Lactation Services

## 2020-02-15 DIAGNOSIS — O039 Complete or unspecified spontaneous abortion without complication: Secondary | ICD-10-CM

## 2020-02-15 NOTE — Telephone Encounter (Signed)
Beta Hcg now 2. Will not have a redraw unless provider who sees patient wants to redraw next week at her appt.   Called patient, no answer, LM to call office for results and to follow up in office next week as scheduled. My Chart message sent.   Patient has follow up appt next week in the office. Plan is for patient to follow up in office next week.

## 2020-02-20 ENCOUNTER — Ambulatory Visit (INDEPENDENT_AMBULATORY_CARE_PROVIDER_SITE_OTHER): Payer: Medicaid Other | Admitting: Certified Nurse Midwife

## 2020-02-20 ENCOUNTER — Encounter: Payer: Self-pay | Admitting: Certified Nurse Midwife

## 2020-02-20 ENCOUNTER — Other Ambulatory Visit: Payer: Self-pay

## 2020-02-20 VITALS — BP 104/71 | HR 66 | Wt 115.7 lb

## 2020-02-20 DIAGNOSIS — Z5189 Encounter for other specified aftercare: Secondary | ICD-10-CM | POA: Diagnosis not present

## 2020-02-20 DIAGNOSIS — O039 Complete or unspecified spontaneous abortion without complication: Secondary | ICD-10-CM

## 2020-02-20 DIAGNOSIS — Z30013 Encounter for initial prescription of injectable contraceptive: Secondary | ICD-10-CM

## 2020-02-20 DIAGNOSIS — Z3202 Encounter for pregnancy test, result negative: Secondary | ICD-10-CM

## 2020-02-20 LAB — POCT PREGNANCY, URINE: Preg Test, Ur: NEGATIVE

## 2020-02-20 MED ORDER — MEDROXYPROGESTERONE ACETATE 150 MG/ML IM SUSP
150.0000 mg | Freq: Once | INTRAMUSCULAR | Status: AC
Start: 2020-02-20 — End: 2020-02-20
  Administered 2020-02-20: 150 mg via INTRAMUSCULAR

## 2020-02-20 NOTE — Progress Notes (Signed)
  History:  [name] is a 24 y.o. G2P1001 who presents to clinic today for miscarriage follow up and birth control. Still having some breast tenderness but no IC since miscarriage. Not having any bleeding or cramping, both stopped two weeks ago.  The following portions of the patient's history were reviewed and updated as appropriate: allergies, current medications, family history, past medical history, social history, past surgical history and problem list.  Review of Systems:  Review of Systems  All other systems reviewed and are negative.    Objective:  Physical Exam BP 104/71   Pulse 66   Wt 115 lb 11.2 oz (52.5 kg)   LMP 01/01/2020   Breastfeeding No   BMI 22.22 kg/m  Physical Exam Constitutional:      Appearance: Normal appearance. She is normal weight.  Cardiovascular:     Rate and Rhythm: Normal rate.     Pulses: Normal pulses.  Pulmonary:     Effort: Pulmonary effort is normal.  Neurological:     Mental Status: She is alert.  Psychiatric:        Mood and Affect: Mood normal.        Behavior: Behavior normal.        Thought Content: Thought content normal.     Comments: PHQ-9 negative   Last HCG on 02/14/20= 2  Labs and Imaging Results for orders placed or performed in visit on 02/20/20 (from the past 24 hour(s))  Pregnancy, urine POC     Status: None   Collection Time: 02/20/20 11:37 AM  Result Value Ref Range   Preg Test, Ur NEGATIVE NEGATIVE   Assessment & Plan:  1. Encounter for initial prescription of injectable contraceptive - Pregnancy, urine POC - medroxyPROGESTERone (DEPO-PROVERA) injection 150 mg  2. Follow-up visit after miscarriage - Pt doing well, does not desire pregnancy at this time. Mental health screening negative. - Follow up in 53mo for depo shot and 13yr for annual exam  Approximately 15 minutes of face-to-face time was spent with this patient   Edd Arbour, CNM, MSN, [facility] 02/20/20 2:05 PM

## 2020-03-25 ENCOUNTER — Telehealth: Payer: Self-pay | Admitting: Obstetrics & Gynecology

## 2020-03-25 NOTE — Telephone Encounter (Signed)
Heavy bleeding while on depo. Patient has questions and need a call back ASAP.

## 2020-03-27 NOTE — Telephone Encounter (Signed)
I returned pt's call and she did not answer. I left VM message requesting her to call back with a detailed message if she still has questions or concerns. Also, pt was asked to state whether we can leave a detailed response on her VM or via Mychart.

## 2020-04-11 ENCOUNTER — Other Ambulatory Visit: Payer: Self-pay

## 2020-04-11 ENCOUNTER — Encounter: Payer: Self-pay | Admitting: Certified Nurse Midwife

## 2020-04-11 ENCOUNTER — Ambulatory Visit (INDEPENDENT_AMBULATORY_CARE_PROVIDER_SITE_OTHER): Payer: Medicaid Other | Admitting: Certified Nurse Midwife

## 2020-04-11 VITALS — BP 108/65 | HR 79 | Wt 116.7 lb

## 2020-04-11 DIAGNOSIS — Z711 Person with feared health complaint in whom no diagnosis is made: Secondary | ICD-10-CM | POA: Diagnosis not present

## 2020-04-11 DIAGNOSIS — Z3009 Encounter for other general counseling and advice on contraception: Secondary | ICD-10-CM | POA: Diagnosis not present

## 2020-04-11 NOTE — Progress Notes (Signed)
History:  [name] is a 24 y.o. G2P1001 who presents to clinic today for questions about Depo side effects. She had some breakthrough bleeding two weeks ago that lasted for a week. It never got heavier than a period and has since gone away. She is also experiencing some soreness in the arm she got her shot.    The following portions of the patient's history were reviewed and updated as appropriate: allergies, current medications, family history, past medical history, social history, past surgical history and problem list.  Review of Systems:  Review of Systems  All other systems reviewed and are negative.  Objective:  Physical Exam BP 108/65   Pulse 79   Wt 116 lb 11.2 oz (52.9 kg)   LMP 03/21/2020 (Approximate)   Breastfeeding No   BMI 22.42 kg/m  Physical Exam Vitals and nursing note reviewed.  Constitutional:      General: She is not in acute distress.    Appearance: Normal appearance. She is normal weight. She is not ill-appearing.  Eyes:     Pupils: Pupils are equal, round, and reactive to light.  Cardiovascular:     Rate and Rhythm: Normal rate.     Pulses: Normal pulses.  Pulmonary:     Effort: Pulmonary effort is normal.  Musculoskeletal:        General: Normal range of motion.     Cervical back: Normal range of motion.  Skin:    General: Skin is warm and dry.  Neurological:     General: No focal deficit present.     Mental Status: She is alert and oriented to person, place, and time.  Psychiatric:        Mood and Affect: Mood normal.        Behavior: Behavior normal.        Thought Content: Thought content normal.        Judgment: Judgment normal.    Assessment & Plan:  Physically well but worried  Birth control counseling - Reassured her that breakthrough bleeding and injection site pain/irritation are expected side effects of Depo.  - Suggested she get her next shot in her left upper aspect of the hip since muscle is larger and better able to  accommodate the injection - Inquired as to whether she'd like to switch methods, discussed Nexplanon, IUD, and pills, she prefers to remain on Depo.  - Follow up in one month for next injection  Bernerd Limbo, CNM 04/11/2020 11:30 AM

## 2020-04-24 ENCOUNTER — Other Ambulatory Visit: Payer: Self-pay | Admitting: Physician Assistant

## 2020-05-10 ENCOUNTER — Ambulatory Visit: Payer: Medicaid Other

## 2020-05-13 ENCOUNTER — Ambulatory Visit: Payer: Medicaid Other

## 2020-08-26 IMAGING — DX DG HAND COMPLETE 3+V*L*
3 series · 3 of 3 positions shown · non-contrast
Comparison: None.

CLINICAL DATA: Hand pain

EXAM:
LEFT HAND - COMPLETE 3+ VIEW

[hand pa]
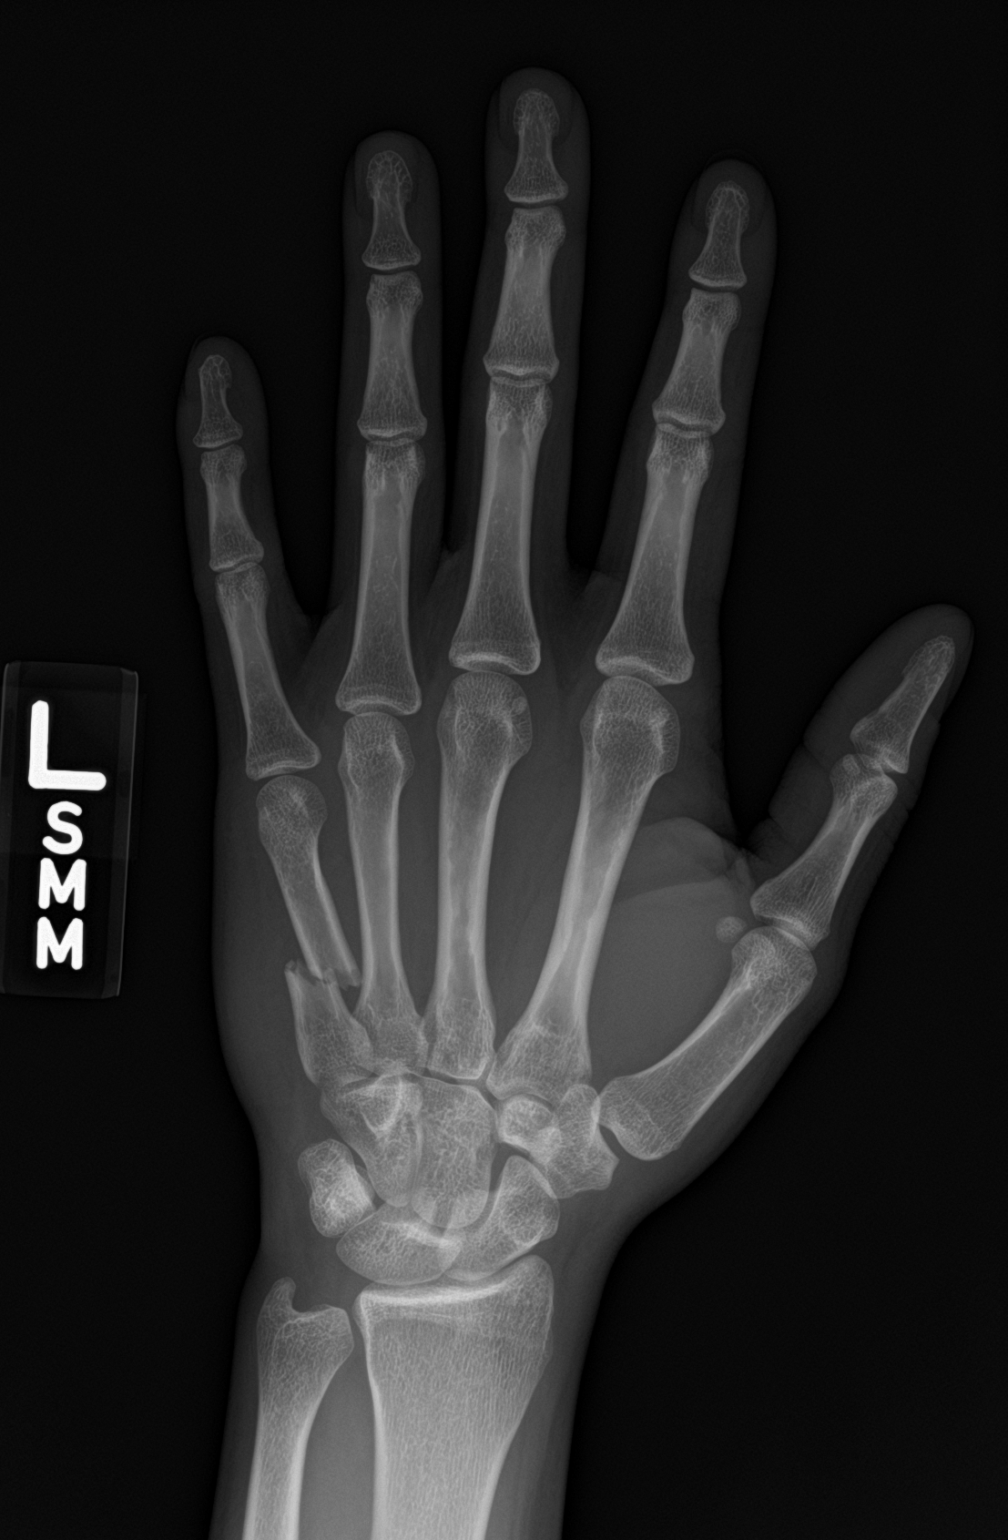

[hand obl]
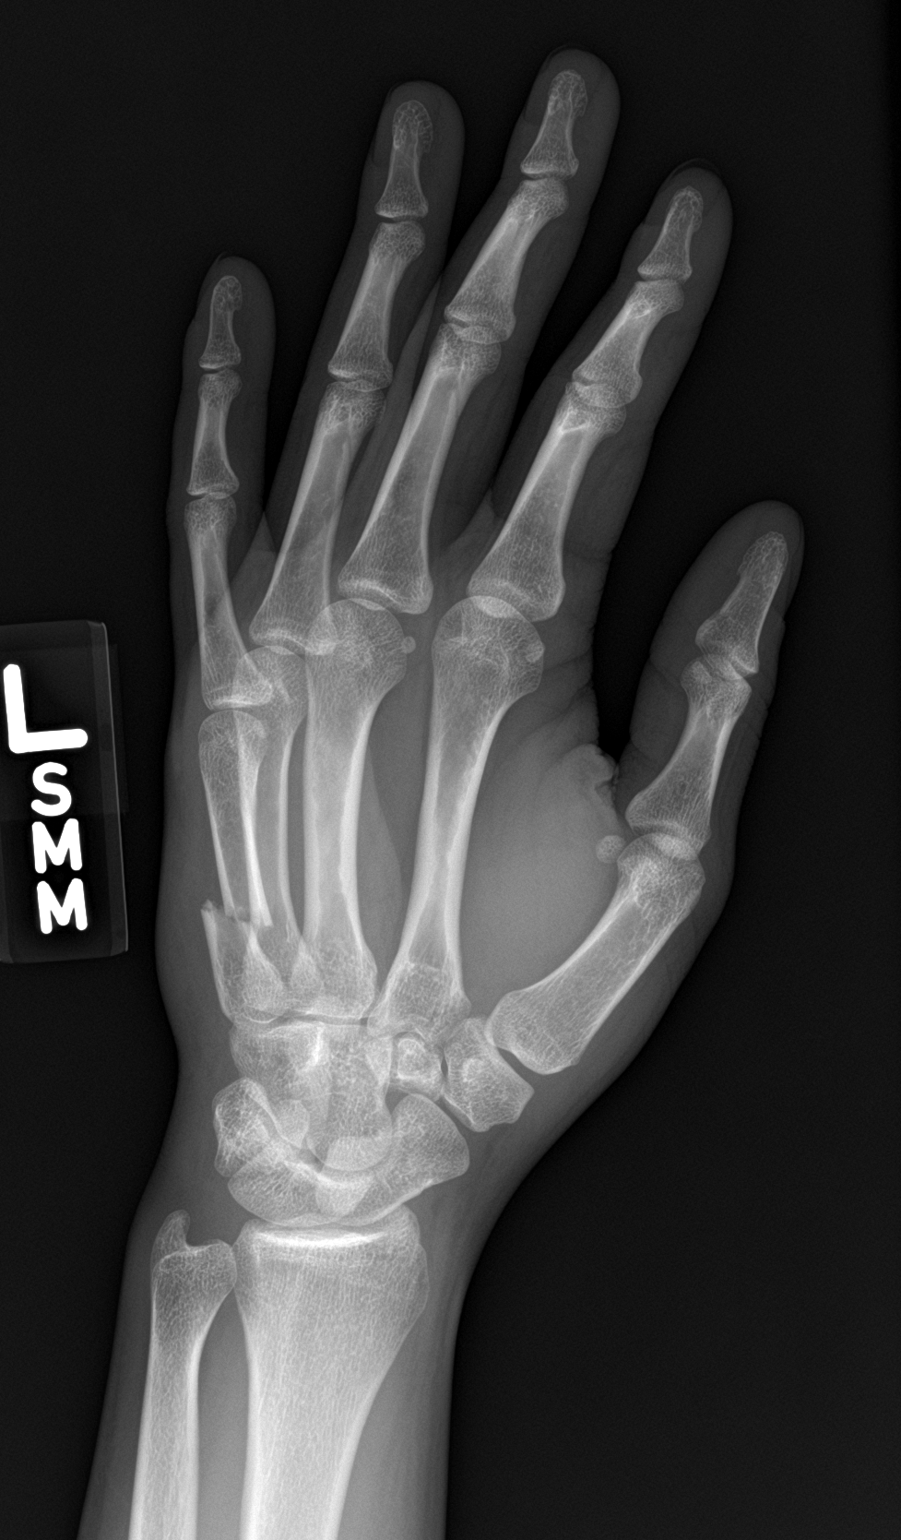

[hand lat]
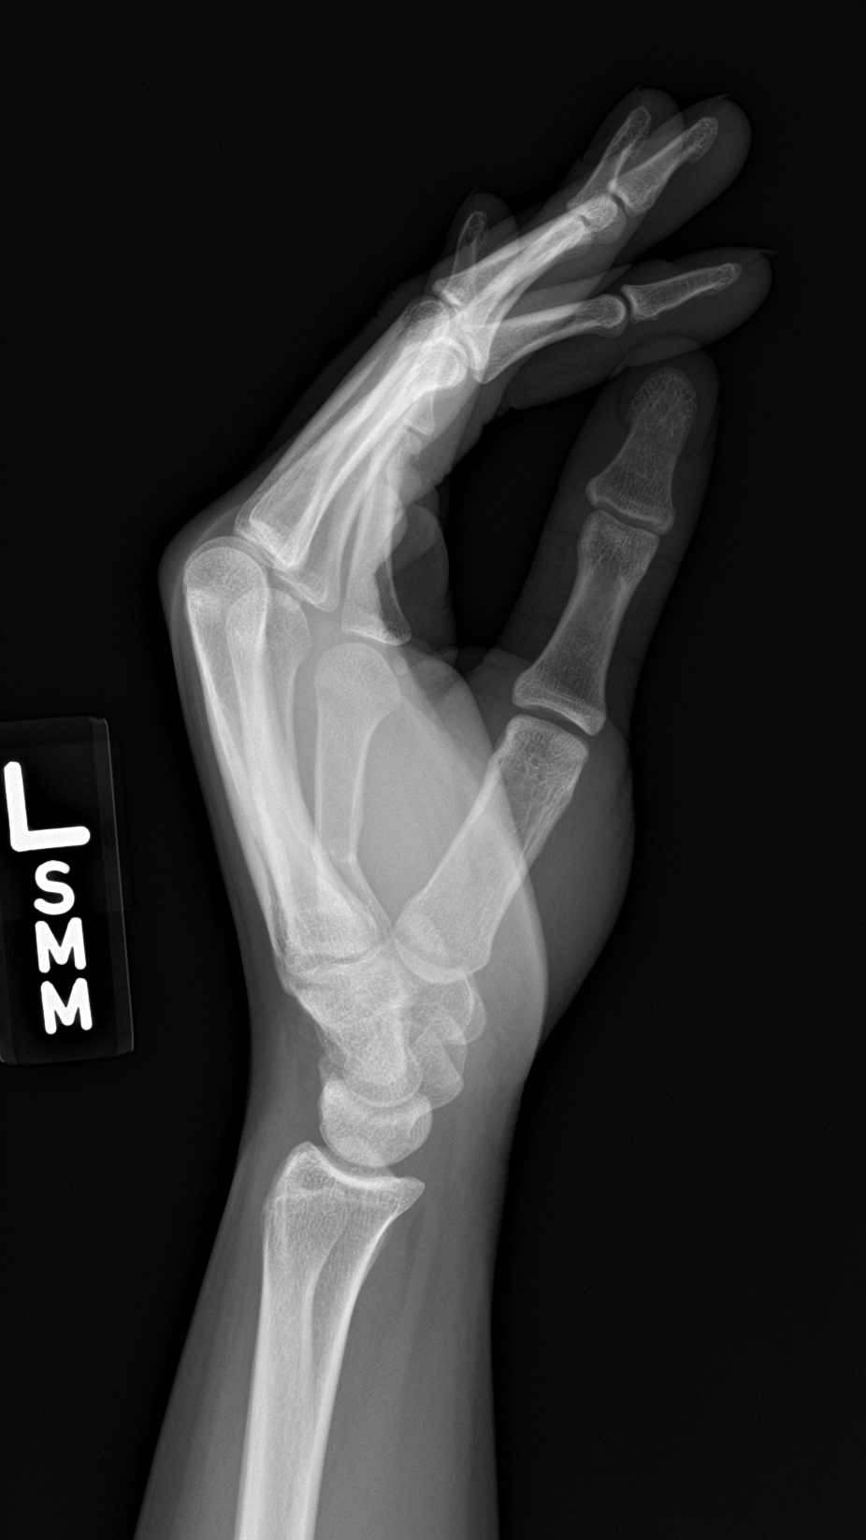

[3 of 3 positions shown; findings below may reference images not displayed]

FINDINGS: There is an acute displaced fracture of the fifth metacarpal. There
is surrounding soft tissue swelling. The carpometacarpal alignment
appears stable/unremarkable. There are no significant degenerative
changes.
IMPRESSION: Acute displaced fracture of the fifth metacarpal.

## 2020-08-29 IMAGING — DX DG FOREARM 2V*L*
2 series · 2 of 2 positions shown · non-contrast
Comparison: October 20, 2019

CLINICAL DATA: Left forearm pain

EXAM:
LEFT FOREARM - 2 VIEW

[x forearm ap left]
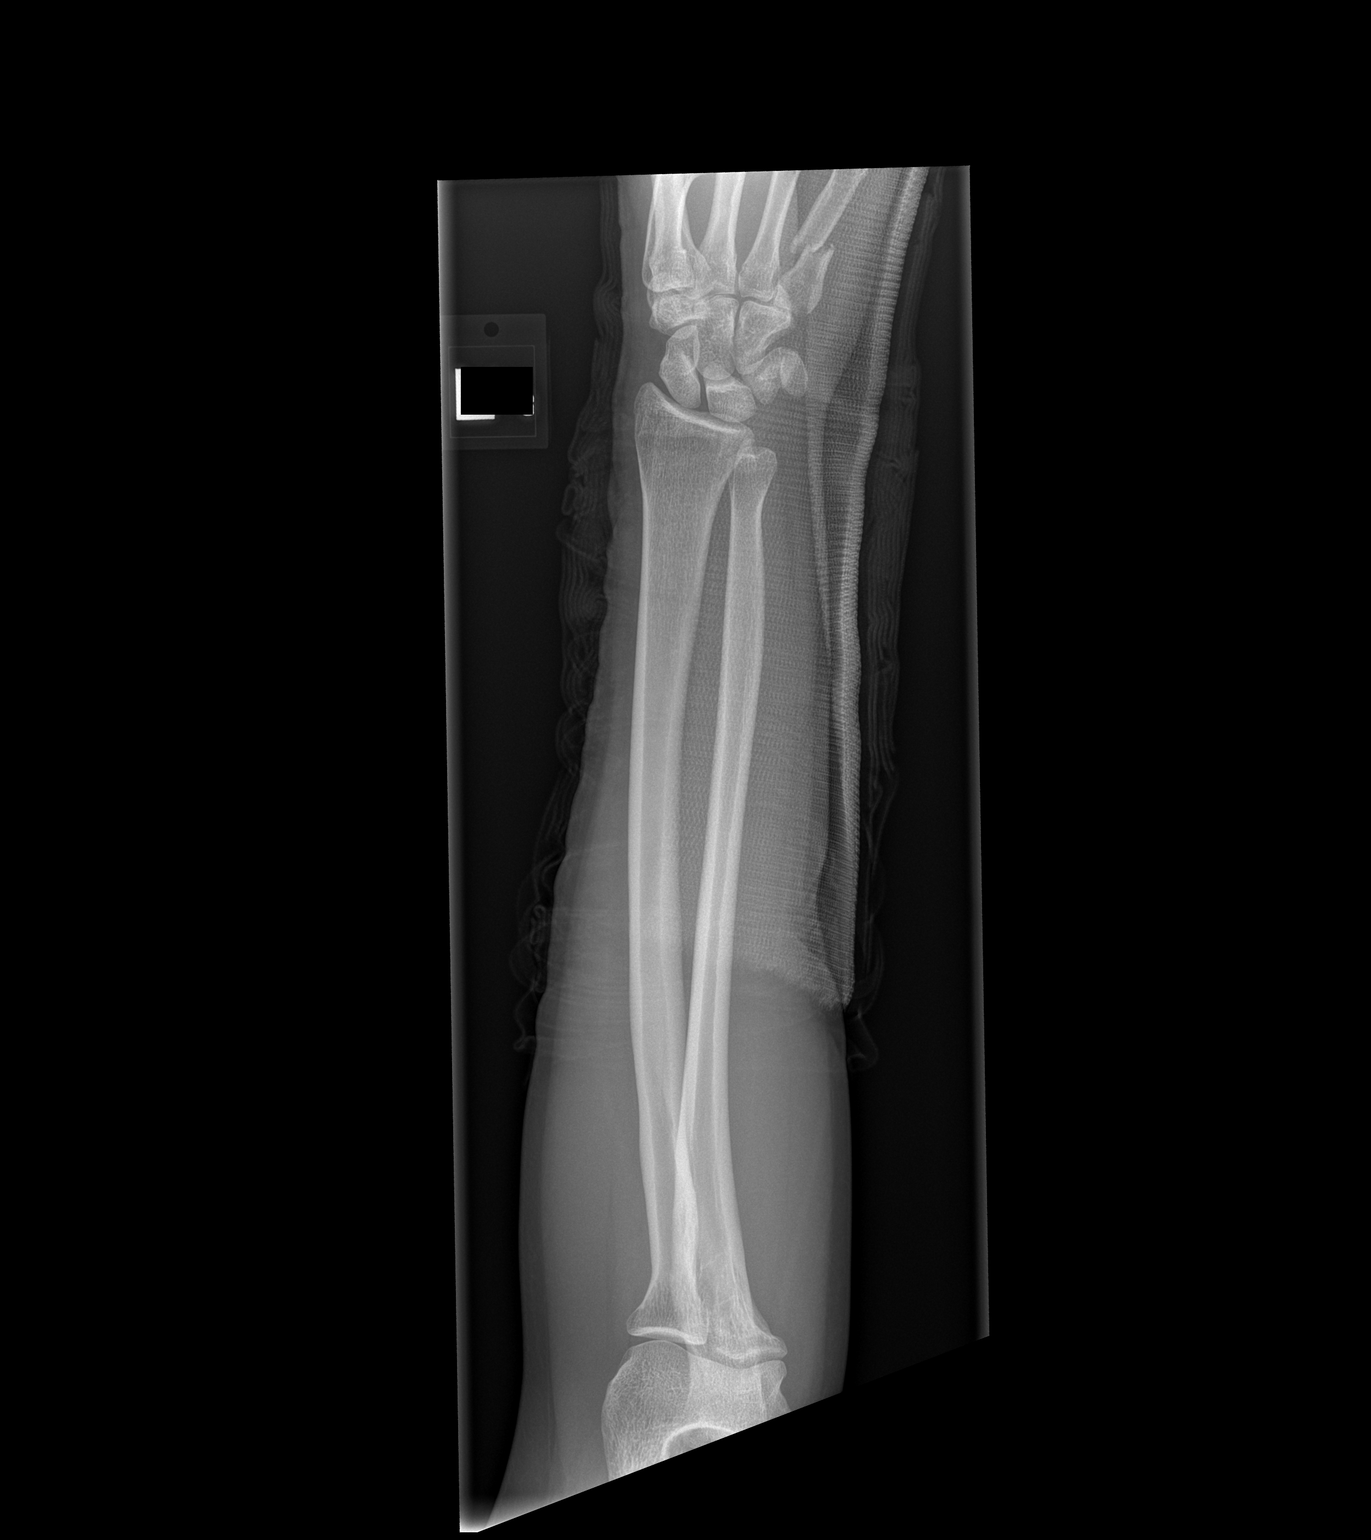

[x forearm lat left]
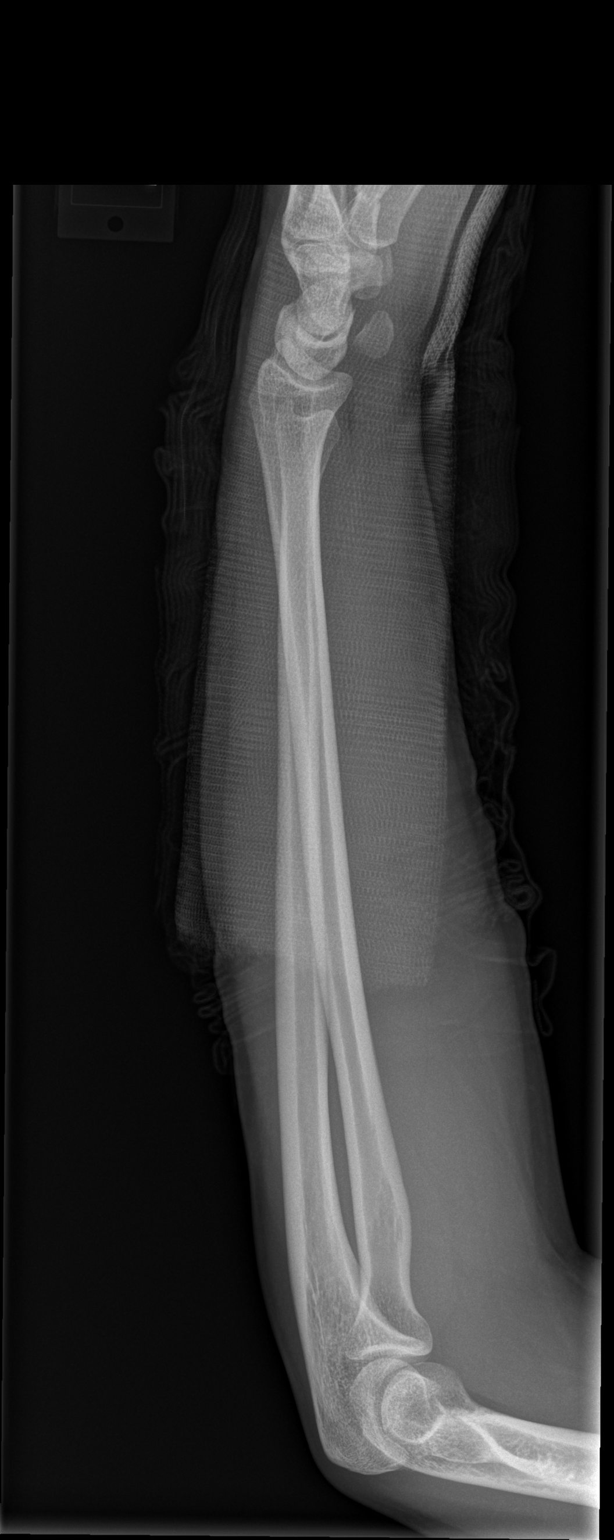

[2 of 2 positions shown; findings below may reference images not displayed]

FINDINGS: There is a fiberglass cast that obscures bony details. Again noted
is a displaced fracture of the fifth metacarpal. There is no acute
displaced fracture of the forearm. No radiopaque foreign body.
IMPRESSION: No acute displaced fracture of the forearm.

Again noted is a displaced fracture of the fifth metacarpal.

## 2020-12-09 ENCOUNTER — Other Ambulatory Visit: Payer: Self-pay | Admitting: Physician Assistant

## 2021-01-24 ENCOUNTER — Encounter (HOSPITAL_COMMUNITY): Payer: Self-pay | Admitting: Obstetrics and Gynecology

## 2021-01-24 ENCOUNTER — Inpatient Hospital Stay (HOSPITAL_COMMUNITY): Payer: Medicaid Other

## 2021-01-24 ENCOUNTER — Other Ambulatory Visit: Payer: Self-pay

## 2021-01-24 ENCOUNTER — Inpatient Hospital Stay (HOSPITAL_COMMUNITY)
Admission: AD | Admit: 2021-01-24 | Discharge: 2021-01-24 | Disposition: A | Discharge status: Home / Self Care | Payer: Medicaid Other | Attending: Obstetrics & Gynecology | Admitting: Obstetrics & Gynecology

## 2021-01-24 DIAGNOSIS — Z3A01 Less than 8 weeks gestation of pregnancy: Secondary | ICD-10-CM | POA: Diagnosis not present

## 2021-01-24 DIAGNOSIS — R109 Unspecified abdominal pain: Secondary | ICD-10-CM | POA: Diagnosis not present

## 2021-01-24 DIAGNOSIS — M549 Dorsalgia, unspecified: Secondary | ICD-10-CM

## 2021-01-24 DIAGNOSIS — O26891 Other specified pregnancy related conditions, first trimester: Secondary | ICD-10-CM | POA: Diagnosis present

## 2021-01-24 DIAGNOSIS — O3680X0 Pregnancy with inconclusive fetal viability, not applicable or unspecified: Secondary | ICD-10-CM | POA: Diagnosis not present

## 2021-01-24 DIAGNOSIS — Z886 Allergy status to analgesic agent status: Secondary | ICD-10-CM | POA: Insufficient documentation

## 2021-01-24 DIAGNOSIS — O26899 Other specified pregnancy related conditions, unspecified trimester: Secondary | ICD-10-CM

## 2021-01-24 DIAGNOSIS — M545 Low back pain, unspecified: Secondary | ICD-10-CM

## 2021-01-24 DIAGNOSIS — R519 Headache, unspecified: Secondary | ICD-10-CM

## 2021-01-24 LAB — URINALYSIS, ROUTINE W REFLEX MICROSCOPIC
Bilirubin Urine: NEGATIVE
Glucose, UA: NEGATIVE mg/dL
Hgb urine dipstick: NEGATIVE
Ketones, ur: NEGATIVE mg/dL
Leukocytes,Ua: NEGATIVE
Nitrite: NEGATIVE
Protein, ur: NEGATIVE mg/dL
Specific Gravity, Urine: 1.027 (ref 1.005–1.030)
pH: 6 (ref 5.0–8.0)

## 2021-01-24 LAB — CBC
HCT: 39.7 % (ref 36.0–46.0)
Hemoglobin: 13.9 g/dL (ref 12.0–15.0)
MCH: 31.9 pg (ref 26.0–34.0)
MCHC: 35 g/dL (ref 30.0–36.0)
MCV: 91.1 fL (ref 80.0–100.0)
Platelets: 254 10*3/uL (ref 150–400)
RBC: 4.36 MIL/uL (ref 3.87–5.11)
RDW: 11.7 % (ref 11.5–15.5)
WBC: 8.7 10*3/uL (ref 4.0–10.5)
nRBC: 0 % (ref 0.0–0.2)

## 2021-01-24 LAB — HCG, QUANTITATIVE, PREGNANCY: hCG, Beta Chain, Quant, S: 3354 m[IU]/mL — ABNORMAL HIGH (ref <5)

## 2021-01-24 LAB — WET PREP, GENITAL
Clue Cells Wet Prep HPF POC: NONE SEEN
Sperm: NONE SEEN
Trich, Wet Prep: NONE SEEN
Yeast Wet Prep HPF POC: NONE SEEN

## 2021-01-24 LAB — POCT PREGNANCY, URINE: Preg Test, Ur: POSITIVE — AB

## 2021-01-24 MED ORDER — CYCLOBENZAPRINE HCL 5 MG PO TABS
10.0000 mg | ORAL_TABLET | Freq: Once | ORAL | Status: AC
  Administered 2021-01-24: 10 mg via ORAL
  Filled 2021-01-24: qty 2

## 2021-01-24 MED ORDER — PREPLUS 27-1 MG PO TABS: 1.0000 | ORAL_TABLET | Freq: Every day | ORAL | 13 refills | Status: DC

## 2021-01-24 NOTE — MAU Provider Note (Signed)
History     CSN: [id number]  Arrival date and time: 01/24/21 1947   Event Date/Time   First Provider Initiated Contact with Patient 01/24/21 2116      Chief Complaint  Patient presents with   Possible Pregnancy   Abdominal Pain   Back Pain   Elizabeth Shaffer is a 25 y.o. G3P1011 at [redacted]w[redacted]d by Unsure LMP of December 30, 2020 .  She presents today for Possible Pregnancy, Abdominal Pain, and Back Pain.  She states her pain has been present for the past 2 weeks. However, today she has back pain in addition to the abdominal cramping.  She denies vaginal concerns including bleeding or discharge, but is concerned that she may be experiencing another miscarriage.  She rates the pain a 7/10.  Patient also reports a HA that has been present since Thursday. She states she has not taken anything for her HA because she tries to avoid medications.  She rates her HA a 10/10.   OB History     Gravida  3   Para  1   Term  1   Preterm  0   AB  0   Living  1      SAB  0   IAB  0   Ectopic  0   Multiple  0   Live Births  1           Past Medical History:  Diagnosis Date   Anemia    during pregnancy   Displaced fracture of fifth metacarpal bone of left hand    Gastritis    GERD (gastroesophageal reflux disease)    Headache    HTN (hypertension)    Migraines    PONV (postoperative nausea and vomiting)     Past Surgical History:  Procedure Laterality Date   hand     carpule tunnel   MOUTH SURGERY  2011   MULTIPLE TOOTH EXTRACTIONS     OPEN REDUCTION INTERNAL FIXATION (ORIF) METACARPAL Left 10/31/2019   Procedure: OPEN REDUCTION INTERNAL FIXATION (ORIF) LEFT FIFTH METACARPAL FRACTURE USING EXSOMED NAIL 4.5mm x 63mm;  Surgeon: Dominica Severin, MD;  Location: [facility] OR;  Service: Orthopedics;  Laterality: Left;  60 mins   WISDOM TOOTH EXTRACTION      Family History  Adopted: Yes  Problem Relation Age of Onset   Hypertension Mother     Social History   Tobacco Use   Smoking  status: Never   Smokeless tobacco: Never  Vaping Use   Vaping Use: Never used  Substance Use Topics   Alcohol use: Not Currently    Comment: social   Drug use: Not Currently    Types: Marijuana    Comment: plans to quit    Allergies:  Allergies  Allergen Reactions   Crab (Diagnostic) Nausea And Vomiting   Ibuprofen     Gastritis     Medications Prior to Admission  Medication Sig Dispense Refill Last Dose   omeprazole (PRILOSEC) 20 MG capsule Take 1 capsule (20 mg total) by mouth daily. **PLEASE CONTACT THE OFFICE TO SCHEDULE A FOLLOW UP** 30 capsule 0 01/23/2021    Review of Systems  Constitutional:  Negative for chills and fever.  Gastrointestinal:  Positive for abdominal pain.  Genitourinary:  Negative for difficulty urinating, dysuria, vaginal bleeding and vaginal discharge.  Musculoskeletal:  Positive for back pain.  Neurological:  Positive for headaches. Negative for dizziness and light-headedness.  Physical Exam   Blood pressure 116/79, pulse 65, temperature 98.5 F (  36.9 C), temperature source Oral, resp. rate 15, height 5' 0.5" (1.537 m), weight 51.7 kg, last menstrual period 12/30/2020, SpO2 100 %.  Physical Exam Constitutional:      Appearance: Normal appearance. She is well-developed.  HENT:     Head: Normocephalic and atraumatic.  Eyes:     Conjunctiva/sclera: Conjunctivae normal.  Cardiovascular:     Rate and Rhythm: Normal rate and regular rhythm.     Heart sounds: Normal heart sounds.  Pulmonary:     Effort: Pulmonary effort is normal. No respiratory distress.     Breath sounds: Normal breath sounds.  Abdominal:     General: Bowel sounds are normal.  Musculoskeletal:        General: Normal range of motion.     Cervical back: Normal range of motion.  Skin:    General: Skin is warm and dry.  Neurological:     Mental Status: She is alert and oriented to person, place, and time.  Psychiatric:        Mood and Affect: Mood normal.        Behavior:  Behavior normal.        Thought Content: Thought content normal.    MAU Course  Procedures  Results for orders placed or performed during the hospital encounter of 01/24/21 (from the past 48 hour(s))  Pregnancy, urine POC     Status: Abnormal   Collection Time: 01/24/21  8:16 PM  Result Value Ref Range   Preg Test, Ur POSITIVE (A) NEGATIVE    Comment:        THE SENSITIVITY OF THIS METHODOLOGY IS >24 mIU/mL   Urinalysis, Routine w reflex microscopic Urine, Clean Catch     Status: Abnormal   Collection Time: 01/24/21  8:20 PM  Result Value Ref Range   Color, Urine YELLOW YELLOW   APPearance HAZY (A) CLEAR   Specific Gravity, Urine 1.027 1.005 - 1.030   pH 6.0 5.0 - 8.0   Glucose, UA NEGATIVE NEGATIVE mg/dL   Hgb urine dipstick NEGATIVE NEGATIVE   Bilirubin Urine NEGATIVE NEGATIVE   Ketones, ur NEGATIVE NEGATIVE mg/dL   Protein, ur NEGATIVE NEGATIVE mg/dL   Nitrite NEGATIVE NEGATIVE   Leukocytes,Ua NEGATIVE NEGATIVE    Comment: Performed at [facility], [address], [place], [place] [address]  CBC     Status: None   Collection Time: 01/24/21  8:57 PM  Result Value Ref Range   WBC 8.7 4.0 - 10.5 K/uL   RBC 4.36 3.87 - 5.11 MIL/uL   Hemoglobin 13.9 12.0 - 15.0 g/dL   HCT 15.1 76.1 - 60.7 %   MCV 91.1 80.0 - 100.0 fL   MCH 31.9 26.0 - 34.0 pg   MCHC 35.0 30.0 - 36.0 g/dL   RDW 37.1 06.2 - 69.4 %   Platelets 254 150 - 400 K/uL   nRBC 0.0 0.0 - 0.2 %    Comment: Performed at [facility], [address] [place], [place] [address]  hCG, quantitative, pregnancy     Status: Abnormal   Collection Time: 01/24/21  8:57 PM  Result Value Ref Range   hCG, Beta Chain, Quant, S 3,354 (H) <5 mIU/mL    Comment:          GEST. AGE      CONC.  (mIU/mL)   <=1 WEEK        5 - 50     2 WEEKS       50 -  500     3 WEEKS       100 - 10,000     4 WEEKS     1,000 - 30,000     5 WEEKS     3,500 - 115,000   6-8 WEEKS     12,000 - 270,000    12 WEEKS     15,000 - 220,000         FEMALE AND NON-PREGNANT FEMALE:     LESS THAN 5 mIU/mL Performed at [facility], [address], [place], [place] [address]   Wet prep, genital     Status: Abnormal   Collection Time: 01/24/21  9:23 PM   Specimen: Vaginal  Result Value Ref Range   Yeast Wet Prep HPF POC NONE SEEN NONE SEEN   Trich, Wet Prep NONE SEEN NONE SEEN   Clue Cells Wet Prep HPF POC NONE SEEN NONE SEEN   WBC, Wet Prep HPF POC FEW (A) NONE SEEN   Sperm NONE SEEN     Comment: Performed at [facility], [address] [place], [place] [address]     US OB LESS THAN 14 WEEKS WITH OB TRANSVAGINAL  Result Date: 01/24/2021 CLINICAL DATA:  Pregnant patient in first-trimester pregnancy with abdominal pain and cramping. Gestational age by LMP 3 weeks 4 days. Beta hCG is pending. EXAM: OBSTETRIC <14 WK [place] AND TRANSVAGINAL OB US TECHNIQUE: Both transabdominal and transvaginal ultrasound examinations were performed for complete evaluation of the gestation as well as the maternal uterus, adnexal regions, and pelvic cul-de-sac. Transvaginal technique was performed to assess early pregnancy. COMPARISON:  None this pregnancy. FINDINGS: Intrauterine gestational sac: Single Yolk sac:  Not Visualized. Embryo:  Not Visualized. MSD: 4.4 mm   5 w   1 d Subchorionic hemorrhage:  None visualized. Maternal uterus/adnexae: The uterus is retroverted. Probable early intrauterine gestational sac is present. No subchorionic hemorrhage. Both ovaries are visualized and are normal. There is a corpus luteum in the right ovary. No adnexal mass or pelvic free fluid. IMPRESSION: Probable early intrauterine gestational sac, but no yolk sac, fetal pole, or cardiac activity yet visualized. Recommend follow-up quantitative B-HCG levels and follow-up US in 14 days to assess viability. This recommendation follows SRU consensus guidelines: Diagnostic Criteria for Nonviable Pregnancy Early in the First Trimester. Malva Limes Med 2013; 242:6834-19.  Electronically Signed   By: Narda Rutherford M.D.   On: 01/24/2021 21:42     MDM Pelvic Exam; Wet Prep and GC/CT Labs: UA, UPT, CBC, hCG Ultrasound Muscle Relaxant Assessment and Plan  25 year old G3P1011 at 3.4 weeks Abdominal Pain Back Pain Headache  -Reviewed POC with patient. -Will collect cultures via self-swab -Discussed usage of muscle relaxant for back pain and HA -Labs ordered. -Will send for [place] and await results.   Cherre Robins 01/24/2021, 9:16 PM   Reassessment (10:56 PM) IUGS w/o YS  -Results as above. -Discussed need for follow up hCG in 48 hours.  -Patient reports improvement in back pain and HA with flexeril dosing.  -Appt made for [facility] on Monday 8/15 at 0830. -Informed that if hCG is rising appropriately, then will follow up with ultrasound in 10-14 days. -Patient verbalizes understanding and without questions.  -Bleeding Precautions Given. -Encouraged to call or return to MAU if symptoms worsen or with the onset of new symptoms. -Discharged to home in stable condition.  Cherre Robins MSN, CNM Advanced Practice Provider, Center for [employer]

## 2021-01-24 NOTE — MAU Note (Signed)
Pt reports positive home preg test 2 days ago, having lower abd cramping. Denies bleeding. Also reports lower back pain.

## 2021-01-27 ENCOUNTER — Ambulatory Visit (INDEPENDENT_AMBULATORY_CARE_PROVIDER_SITE_OTHER): Payer: Medicaid Other | Admitting: General Practice

## 2021-01-27 ENCOUNTER — Other Ambulatory Visit: Payer: Self-pay

## 2021-01-27 VITALS — BP 115/67 | HR 68

## 2021-01-27 DIAGNOSIS — O3680X0 Pregnancy with inconclusive fetal viability, not applicable or unspecified: Secondary | ICD-10-CM

## 2021-01-27 DIAGNOSIS — O219 Vomiting of pregnancy, unspecified: Secondary | ICD-10-CM

## 2021-01-27 LAB — GC/CHLAMYDIA PROBE AMP (~~LOC~~) NOT AT ARMC
Chlamydia: NEGATIVE
Comment: NEGATIVE
Comment: NORMAL
Neisseria Gonorrhea: NEGATIVE

## 2021-01-27 LAB — BETA HCG QUANT (REF LAB): hCG Quant: 7640 m[IU]/mL

## 2021-01-27 MED ORDER — PROMETHAZINE HCL 25 MG PO TABS: 25.0000 mg | ORAL_TABLET | Freq: Four times a day (QID) | ORAL | 0 refills | Status: DC | PRN

## 2021-01-27 NOTE — Progress Notes (Signed)
Patient presents to office today for stat bhcg following up from MAU visit on 8/12. Patient reports improvement in pain since then and it is only intermittent now. Denies bleeding. Discussed with patient we are monitoring your bhcg levels today, results take approximately 2 hours to finalize and will be reviewed with a provider in the office. Patient verbalized understanding & confirmed callback number.  Reviewed results with Dr Debroah Loop who finds appropriate rise in bhcg levels- patient should have follow up ultrasound in 10-14 days. Scheduled 8/25.  Called patient and reviewed results with her as well as discussed follow up ultrasound. Patient verbalized understanding & asked about dating. Discussed with patient we cannot confirm dating until we see baby on ultrasound and can measure baby to confirm dating. Advised until then we only have her period as an estimate. Ectopic precautions reviewed. Patient verbalized understanding.  Chase Caller RN BSN 01/27/21

## 2021-02-03 ENCOUNTER — Encounter (HOSPITAL_COMMUNITY): Payer: Self-pay | Admitting: Obstetrics & Gynecology

## 2021-02-03 ENCOUNTER — Other Ambulatory Visit: Payer: Self-pay

## 2021-02-03 ENCOUNTER — Inpatient Hospital Stay (HOSPITAL_COMMUNITY)
Admission: AD | Admit: 2021-02-03 | Discharge: 2021-02-03 | Disposition: A | Discharge status: Home / Self Care | Payer: Medicaid Other | Attending: Family Medicine | Admitting: Family Medicine

## 2021-02-03 ENCOUNTER — Inpatient Hospital Stay (HOSPITAL_COMMUNITY): Payer: Medicaid Other

## 2021-02-03 DIAGNOSIS — O26851 Spotting complicating pregnancy, first trimester: Secondary | ICD-10-CM | POA: Insufficient documentation

## 2021-02-03 DIAGNOSIS — R112 Nausea with vomiting, unspecified: Secondary | ICD-10-CM

## 2021-02-03 DIAGNOSIS — Z79899 Other long term (current) drug therapy: Secondary | ICD-10-CM | POA: Insufficient documentation

## 2021-02-03 DIAGNOSIS — O209 Hemorrhage in early pregnancy, unspecified: Secondary | ICD-10-CM

## 2021-02-03 DIAGNOSIS — O219 Vomiting of pregnancy, unspecified: Secondary | ICD-10-CM | POA: Insufficient documentation

## 2021-02-03 DIAGNOSIS — O26891 Other specified pregnancy related conditions, first trimester: Secondary | ICD-10-CM | POA: Insufficient documentation

## 2021-02-03 DIAGNOSIS — Z3A01 Less than 8 weeks gestation of pregnancy: Secondary | ICD-10-CM | POA: Diagnosis not present

## 2021-02-03 DIAGNOSIS — M549 Dorsalgia, unspecified: Secondary | ICD-10-CM | POA: Diagnosis not present

## 2021-02-03 DIAGNOSIS — N939 Abnormal uterine and vaginal bleeding, unspecified: Secondary | ICD-10-CM

## 2021-02-03 LAB — COMPREHENSIVE METABOLIC PANEL
ALT: 19 U/L (ref 0–44)
AST: 18 U/L (ref 15–41)
Albumin: 4.4 g/dL (ref 3.5–5.0)
Alkaline Phosphatase: 48 U/L (ref 38–126)
Anion gap: 8 (ref 5–15)
BUN: 7 mg/dL (ref 6–20)
CO2: 24 mmol/L (ref 22–32)
Calcium: 9.9 mg/dL (ref 8.9–10.3)
Chloride: 103 mmol/L (ref 98–111)
Creatinine, Ser: 0.74 mg/dL (ref 0.44–1.00)
GFR, Estimated: >60 mL/min (ref >60)
Glucose, Bld: 87 mg/dL (ref 70–99)
Potassium: 4 mmol/L (ref 3.5–5.1)
Sodium: 135 mmol/L (ref 135–145)
Total Bilirubin: 2.7 mg/dL — ABNORMAL HIGH (ref 0.3–1.2)
Total Protein: 7.7 g/dL (ref 6.5–8.1)

## 2021-02-03 LAB — URINALYSIS, ROUTINE W REFLEX MICROSCOPIC
Bilirubin Urine: NEGATIVE
Glucose, UA: NEGATIVE mg/dL
Ketones, ur: 5 mg/dL — AB
Leukocytes,Ua: NEGATIVE
Nitrite: NEGATIVE
Protein, ur: 30 mg/dL — AB
Specific Gravity, Urine: 1.032 — ABNORMAL HIGH (ref 1.005–1.030)
pH: 9 — ABNORMAL HIGH (ref 5.0–8.0)

## 2021-02-03 LAB — CBC
HCT: 38.9 % (ref 36.0–46.0)
Hemoglobin: 13.7 g/dL (ref 12.0–15.0)
MCH: 32.5 pg (ref 26.0–34.0)
MCHC: 35.2 g/dL (ref 30.0–36.0)
MCV: 92.2 fL (ref 80.0–100.0)
Platelets: 223 10*3/uL (ref 150–400)
RBC: 4.22 MIL/uL (ref 3.87–5.11)
RDW: 11.5 % (ref 11.5–15.5)
WBC: 7.8 10*3/uL (ref 4.0–10.5)
nRBC: 0 % (ref 0.0–0.2)

## 2021-02-03 LAB — HCG, QUANTITATIVE, PREGNANCY: hCG, Beta Chain, Quant, S: 69723 m[IU]/mL — ABNORMAL HIGH (ref <5)

## 2021-02-03 MED ORDER — ONDANSETRON HCL 4 MG/2ML IJ SOLN
4.0000 mg | Freq: Once | INTRAMUSCULAR | Status: AC
  Administered 2021-02-03: 4 mg via INTRAVENOUS
  Filled 2021-02-03: qty 2

## 2021-02-03 MED ORDER — ONDANSETRON 4 MG PO TBDP: 4.0000 mg | ORAL_TABLET | Freq: Four times a day (QID) | ORAL | 0 refills | Status: DC | PRN

## 2021-02-03 MED ORDER — LACTATED RINGERS IV BOLUS
1000.0000 mL | Freq: Once | INTRAVENOUS | Status: AC
  Administered 2021-02-03: 1000 mL via INTRAVENOUS

## 2021-02-03 NOTE — MAU Provider Note (Signed)
History     CSN: [id number]  Arrival date and time: 02/03/21 0840   Event Date/Time   First Provider Initiated Contact with Patient 02/03/21 [phone]      Chief Complaint  Patient presents with   Vaginal Bleeding   Back Pain   Elizabeth Shaffer is a 25 yo G3P1001 female at [redacted]w[redacted]d GA presenting to the MAU with vomiting, vaginal bleeding, and back pain. She woke up this morning with nausea and started vomiting at 7am. When she was in the bathroom, she noticed some vaginal bleeding and when she wiped she notes that there was black blood. She says that her emesis was yellow and she was also dry-heaving. Her back pain started after the episodes of vomiting. She denies any abdominal pain or cramping.   Vaginal Bleeding Associated symptoms include back pain, nausea and vomiting. Pertinent negatives include no headaches.  Back Pain Pertinent negatives include no headaches.   OB History     Gravida  3   Para  1   Term  1   Preterm  0   AB  0   Living  1      SAB  0   IAB  0   Ectopic  0   Multiple  0   Live Births  1           Past Medical History:  Diagnosis Date   Anemia    during pregnancy   Displaced fracture of fifth metacarpal bone of left hand    Gastritis    GERD (gastroesophageal reflux disease)    Headache    HTN (hypertension)    Migraines    PONV (postoperative nausea and vomiting)     Past Surgical History:  Procedure Laterality Date   hand     carpule tunnel   MOUTH SURGERY  2011   MULTIPLE TOOTH EXTRACTIONS     OPEN REDUCTION INTERNAL FIXATION (ORIF) METACARPAL Left 10/31/2019   Procedure: OPEN REDUCTION INTERNAL FIXATION (ORIF) LEFT FIFTH METACARPAL FRACTURE USING EXSOMED NAIL 4.17mm x 31mm;  Surgeon: Dominica Severin, MD;  Location: [facility] OR;  Service: Orthopedics;  Laterality: Left;  60 mins   WISDOM TOOTH EXTRACTION      Family History  Adopted: Yes  Problem Relation Age of Onset   Hypertension Mother     Social History   Tobacco Use   Smoking  status: Never   Smokeless tobacco: Never  Vaping Use   Vaping Use: Never used  Substance Use Topics   Alcohol use: Not Currently    Comment: social   Drug use: Not Currently    Types: Marijuana    Comment: plans to quit    Allergies:  Allergies  Allergen Reactions   Crab (Diagnostic) Nausea And Vomiting   Ibuprofen     Gastritis     Medications Prior to Admission  Medication Sig Dispense Refill Last Dose   omeprazole (PRILOSEC) 20 MG capsule Take 1 capsule (20 mg total) by mouth daily. **PLEASE CONTACT THE OFFICE TO SCHEDULE A FOLLOW UP** 30 capsule 0 Past Week   Prenatal Vit-Fe Fumarate-FA (PREPLUS) 27-1 MG TABS Take 1 tablet by mouth daily. 30 tablet 13 02/02/2021   promethazine (PHENERGAN) 25 MG tablet Take 1 tablet (25 mg total) by mouth every 6 (six) hours as needed for nausea or vomiting. 30 tablet 0     Review of Systems  Constitutional:  Positive for appetite change.  Eyes: Negative.   Respiratory: Negative.    Cardiovascular: Negative.  Gastrointestinal:  Positive for nausea and vomiting.  Genitourinary:  Positive for vaginal bleeding.  Musculoskeletal:  Positive for back pain.  Skin: Negative.   Neurological:  Negative for headaches.  Physical Exam   Blood pressure 115/71, pulse 86, temperature 98.3 F (36.8 C), temperature source Oral, resp. rate 16, height 5' 0.5" (1.537 m), weight 53.5 kg, last menstrual period 12/30/2020, SpO2 99 %.  Physical Exam Vitals reviewed.    MAU Course  Procedures  MDM Urinalysis, serum hCG, CBC, and CMP collected due to vaginal bleeding, nausea, and vomiting. Ultrasound showed live intrauterine gestation at [redacted]w[redacted]d. Given Ondansetron 4mg  IV and LR bolus for nausea and vomiting.  Assessment and Plan   Nausea and vomiting - Ondansetron 4mg  PO  - discharge patient - keep appointment scheduled on 8/25  , Medical Student 02/03/2021, 1:34 PM

## 2021-02-03 NOTE — MAU Note (Signed)
Been real sick. Saw almost black blood in her underwear and when she wiped. No lower cramping pain, little pain in low back.

## 2021-02-06 ENCOUNTER — Other Ambulatory Visit: Payer: Self-pay

## 2021-02-06 ENCOUNTER — Telehealth: Payer: Self-pay | Admitting: General Practice

## 2021-02-06 ENCOUNTER — Ambulatory Visit
Admission: RE | Admit: 2021-02-06 | Discharge: 2021-02-06 | Disposition: A | Discharge status: Home / Self Care | Payer: Medicaid Other | Source: Ambulatory Visit | Attending: Obstetrics & Gynecology | Admitting: Obstetrics & Gynecology

## 2021-02-06 DIAGNOSIS — O3680X0 Pregnancy with inconclusive fetal viability, not applicable or unspecified: Secondary | ICD-10-CM

## 2021-02-06 NOTE — Telephone Encounter (Signed)
Patient is scheduled for u/s this morning but it is not indicated given recent MAU visit in which u/s was performed. Called patient, no answer- left message to call us back regarding canceling her appt.

## 2021-02-08 ENCOUNTER — Other Ambulatory Visit: Payer: Self-pay

## 2021-02-08 ENCOUNTER — Inpatient Hospital Stay (HOSPITAL_COMMUNITY): Payer: Medicaid Other

## 2021-02-08 ENCOUNTER — Inpatient Hospital Stay (HOSPITAL_COMMUNITY)
Admission: AD | Admit: 2021-02-08 | Discharge: 2021-02-08 | Disposition: A | Discharge status: Home / Self Care | Payer: Medicaid Other | Attending: Obstetrics and Gynecology | Admitting: Obstetrics and Gynecology

## 2021-02-08 DIAGNOSIS — R109 Unspecified abdominal pain: Secondary | ICD-10-CM | POA: Diagnosis not present

## 2021-02-08 DIAGNOSIS — N898 Other specified noninflammatory disorders of vagina: Secondary | ICD-10-CM | POA: Diagnosis not present

## 2021-02-08 DIAGNOSIS — Z3A01 Less than 8 weeks gestation of pregnancy: Secondary | ICD-10-CM | POA: Diagnosis not present

## 2021-02-08 DIAGNOSIS — O26851 Spotting complicating pregnancy, first trimester: Secondary | ICD-10-CM | POA: Diagnosis not present

## 2021-02-08 DIAGNOSIS — Z79899 Other long term (current) drug therapy: Secondary | ICD-10-CM | POA: Diagnosis not present

## 2021-02-08 DIAGNOSIS — O26891 Other specified pregnancy related conditions, first trimester: Secondary | ICD-10-CM | POA: Diagnosis not present

## 2021-02-08 DIAGNOSIS — O4691 Antepartum hemorrhage, unspecified, first trimester: Secondary | ICD-10-CM | POA: Diagnosis not present

## 2021-02-08 DIAGNOSIS — N939 Abnormal uterine and vaginal bleeding, unspecified: Secondary | ICD-10-CM

## 2021-02-08 MED ORDER — ONDANSETRON 4 MG PO TBDP: 4.0000 mg | ORAL_TABLET | Freq: Four times a day (QID) | ORAL | 0 refills | Status: DC | PRN

## 2021-02-08 NOTE — MAU Provider Note (Addendum)
Patient Elizabeth Shaffer is a 25 y.o. G3P1001 At [redacted]w[redacted]d here with complaints of vaginal spotting and cramping. She also endorses some heart burn. She denies fever, SOB. She has had some occasional diarrhea. She denies any symptoms of UTI, other concerning vaginal discharge.  She was seen in MAU on 01/24/2021 for vaginal bleeding; found to have viable IUP.  She denies recent intercourse. She reports that her Zofran is working. She has a history of gastritis.  History     CSN: [id number]  Arrival date and time: 02/08/21 1541   Event Date/Time   First Provider Initiated Contact with Patient 02/08/21 1705      Chief Complaint  Patient presents with   Vaginal Bleeding   Vaginal Bleeding The patient's primary symptoms include vaginal bleeding. The patient's pertinent negatives include no genital itching. This is a chronic problem. The current episode started today. Associated symptoms include abdominal pain. Pertinent negatives include no chills, constipation, diarrhea, dysuria, fever, urgency or vomiting. The vaginal discharge was bloody. The vaginal bleeding is spotting. She has been passing clots (tiny black blood clots). Nothing aggravates the symptoms.   OB History     Gravida  3   Para  1   Term  1   Preterm  0   AB  0   Living  1      SAB  0   IAB  0   Ectopic  0   Multiple  0   Live Births  1           Past Medical History:  Diagnosis Date   Anemia    during pregnancy   Displaced fracture of fifth metacarpal bone of left hand    Gastritis    GERD (gastroesophageal reflux disease)    Headache    HTN (hypertension)    Migraines    PONV (postoperative nausea and vomiting)     Past Surgical History:  Procedure Laterality Date   hand     carpule tunnel   MOUTH SURGERY  2011   MULTIPLE TOOTH EXTRACTIONS     OPEN REDUCTION INTERNAL FIXATION (ORIF) METACARPAL Left 10/31/2019   Procedure: OPEN REDUCTION INTERNAL FIXATION (ORIF) LEFT FIFTH METACARPAL FRACTURE  USING EXSOMED NAIL 4.14mm x 65mm;  Surgeon: Dominica Severin, MD;  Location: [facility];  Service: Orthopedics;  Laterality: Left;  60 mins   WISDOM TOOTH EXTRACTION      Family History  Adopted: Yes  Problem Relation Age of Onset   Hypertension Mother     Social History   Tobacco Use   Smoking status: Never   Smokeless tobacco: Never  Vaping Use   Vaping Use: Never used  Substance Use Topics   Alcohol use: Not Currently    Comment: social   Drug use: Not Currently    Types: Marijuana    Comment: plans to quit    Allergies:  Allergies  Allergen Reactions   Crab (Diagnostic) Nausea And Vomiting   Ibuprofen     Gastritis     Medications Prior to Admission  Medication Sig Dispense Refill Last Dose   omeprazole (PRILOSEC) 20 MG capsule Take 1 capsule (20 mg total) by mouth daily. **PLEASE CONTACT THE OFFICE TO SCHEDULE A FOLLOW UP** 30 capsule 0    ondansetron (ZOFRAN ODT) 4 MG disintegrating tablet Take 1 tablet (4 mg total) by mouth every 6 (six) hours as needed for nausea. 30 tablet 0    Prenatal Vit-Fe Fumarate-FA (PREPLUS) 27-1 MG TABS Take 1 tablet by  mouth daily. 30 tablet 13    promethazine (PHENERGAN) 25 MG tablet Take 1 tablet (25 mg total) by mouth every 6 (six) hours as needed for nausea or vomiting. 30 tablet 0     Review of Systems  Constitutional: Negative.  Negative for chills and fever.  HENT: Negative.    Gastrointestinal:  Positive for abdominal pain. Negative for constipation, diarrhea and vomiting.  Genitourinary:  Positive for vaginal bleeding. Negative for dysuria and urgency.  Neurological: Negative.   Psychiatric/Behavioral: Negative.    Physical Exam   Blood pressure 114/71, pulse 73, temperature 99.6 F (37.6 C), temperature source Oral, resp. rate 16, height 5' 0.5" (1.537 m), weight 53.1 kg, last menstrual period 12/30/2020, SpO2 100 %.  Physical Exam Pulmonary:     Effort: Pulmonary effort is normal.  Abdominal:     General: Abdomen is flat.   Skin:    General: Skin is warm and dry.  Neurological:     General: No focal deficit present.     Mental Status: She is alert.    MAU Course  Procedures  MDM -[place] is reassuring. Viable intrauterine pregnancy with cardiac activity noted; I have independently reviewed the [place] images, which reveal finding of viable intrauterine pregnancy.  -reviewed wet prep and GC from 01/24/2021; all negative -Blood type is B pos -speculum exam NOT done  Assessment and Plan   1. Vaginal bleeding   -reassurance and support given around vaginal bleeding; discussed when to return to MAU -patient to make appt with [facility]; this is her preferred OB provider -patient stable for discharge; return to MAU if condition worsens or changes  -discussed foods to limit heartburn and nausea/vomiting; continue taking antiemetics and prilosec as it has worked for her in the past  Charlesetta Garibaldi Aloise Copus 02/08/2021, 5:06 PM

## 2021-02-08 NOTE — MAU Note (Signed)
Was here last wk was seeing old blood, no every time she wiped. Now is heavier, seeing little clots with it, some mucous- seeing every time she wipes and in her underwear. Some cramping.

## 2021-02-10 ENCOUNTER — Ambulatory Visit: Payer: Medicaid Other

## 2021-03-09 ENCOUNTER — Other Ambulatory Visit: Payer: Self-pay

## 2021-03-09 ENCOUNTER — Inpatient Hospital Stay (HOSPITAL_COMMUNITY): Payer: Medicaid Other

## 2021-03-09 ENCOUNTER — Encounter (HOSPITAL_COMMUNITY): Payer: Self-pay | Admitting: Obstetrics and Gynecology

## 2021-03-09 ENCOUNTER — Inpatient Hospital Stay (HOSPITAL_COMMUNITY)
Admission: AD | Admit: 2021-03-09 | Discharge: 2021-03-09 | Disposition: A | Discharge status: Home / Self Care | Payer: Medicaid Other | Attending: Obstetrics and Gynecology | Admitting: Obstetrics and Gynecology

## 2021-03-09 DIAGNOSIS — O26891 Other specified pregnancy related conditions, first trimester: Secondary | ICD-10-CM | POA: Insufficient documentation

## 2021-03-09 DIAGNOSIS — Z3A11 11 weeks gestation of pregnancy: Secondary | ICD-10-CM | POA: Diagnosis not present

## 2021-03-09 DIAGNOSIS — Z79899 Other long term (current) drug therapy: Secondary | ICD-10-CM | POA: Diagnosis not present

## 2021-03-09 DIAGNOSIS — O034 Incomplete spontaneous abortion without complication: Secondary | ICD-10-CM | POA: Diagnosis not present

## 2021-03-09 DIAGNOSIS — O209 Hemorrhage in early pregnancy, unspecified: Secondary | ICD-10-CM

## 2021-03-09 DIAGNOSIS — R109 Unspecified abdominal pain: Secondary | ICD-10-CM | POA: Diagnosis not present

## 2021-03-09 DIAGNOSIS — Z3A01 Less than 8 weeks gestation of pregnancy: Secondary | ICD-10-CM

## 2021-03-09 LAB — URINALYSIS, ROUTINE W REFLEX MICROSCOPIC
Bilirubin Urine: NEGATIVE
Glucose, UA: NEGATIVE mg/dL
Hgb urine dipstick: NEGATIVE
Ketones, ur: 5 mg/dL — AB
Leukocytes,Ua: NEGATIVE
Nitrite: NEGATIVE
Protein, ur: NEGATIVE mg/dL
Specific Gravity, Urine: 1.024 (ref 1.005–1.030)
pH: 6 (ref 5.0–8.0)

## 2021-03-09 LAB — CBC
HCT: 38.1 % (ref 36.0–46.0)
Hemoglobin: 13.1 g/dL (ref 12.0–15.0)
MCH: 32.7 pg (ref 26.0–34.0)
MCHC: 34.4 g/dL (ref 30.0–36.0)
MCV: 95 fL (ref 80.0–100.0)
Platelets: 238 K/uL (ref 150–400)
RBC: 4.01 MIL/uL (ref 3.87–5.11)
RDW: 12.8 % (ref 11.5–15.5)
WBC: 6.2 K/uL (ref 4.0–10.5)
nRBC: 0 % (ref 0.0–0.2)

## 2021-03-09 LAB — HCG, QUANTITATIVE, PREGNANCY: hCG, Beta Chain, Quant, S: 169 m[IU]/mL — ABNORMAL HIGH

## 2021-03-09 MED ORDER — OXYCODONE-ACETAMINOPHEN 5-325 MG PO TABS: 1.0000 | ORAL_TABLET | Freq: Four times a day (QID) | ORAL | 0 refills | Status: DC | PRN

## 2021-03-09 MED ORDER — MISOPROSTOL 200 MCG PO TABS: 800.0000 ug | ORAL_TABLET | Freq: Once | ORAL | 0 refills | Status: DC

## 2021-03-09 NOTE — MAU Note (Signed)
So, had an abortion 9/1, pills.  Missed f/u appt. Has been having bleeding, spotting, now light brownish pink color, not every day.  +HPT 2 days ago and again today.  Still having some preg symptoms. (Nausea and lower abd cramping.Elizabeth Shaffer

## 2021-03-09 NOTE — MAU Provider Note (Signed)
History     CSN: [id number]  Arrival date and time: 03/09/21 1300   Event Date/Time   First Provider Initiated Contact with Patient 03/09/21 1344      Chief Complaint  Patient presents with   Abdominal Pain   Vaginal Bleeding   HPI Elizabeth Shaffer is a 25 y.o. G3P1001 at [redacted]w[redacted]d who presents with vaginal bleeding. She reports she took pills for a TAB on 9/1. She reports an episode of large bleeding with abdominal cramping. She passed many large clots. Since then, she has had bleeding every day. Sometimes she feels like she is passing tissue. She has not had intercourse since 9/1. She reports she took a positive pregnancy test this week and is concerned she is still pregnant.   OB History     Gravida  3   Para  1   Term  1   Preterm  0   AB  0   Living  1      SAB  0   IAB  0   Ectopic  0   Multiple  1   Live Births  1           Past Medical History:  Diagnosis Date   Anemia    during pregnancy   Displaced fracture of fifth metacarpal bone of left hand    Gastritis    GERD (gastroesophageal reflux disease)    Headache    HTN (hypertension)    Migraines    PONV (postoperative nausea and vomiting)     Past Surgical History:  Procedure Laterality Date   hand     carpule tunnel   MOUTH SURGERY  2011   MULTIPLE TOOTH EXTRACTIONS     OPEN REDUCTION INTERNAL FIXATION (ORIF) METACARPAL Left 10/31/2019   Procedure: OPEN REDUCTION INTERNAL FIXATION (ORIF) LEFT FIFTH METACARPAL FRACTURE USING EXSOMED NAIL 4.81mm x 12mm;  Surgeon: Dominica Severin, MD;  Location: [facility];  Service: Orthopedics;  Laterality: Left;  60 mins   WISDOM TOOTH EXTRACTION      Family History  Adopted: Yes  Problem Relation Age of Onset   Hypertension Mother     Social History   Tobacco Use   Smoking status: Never   Smokeless tobacco: Never  Vaping Use   Vaping Use: Never used  Substance Use Topics   Alcohol use: Not Currently    Comment: social   Drug use: Not Currently     Types: Marijuana    Comment: plans to quit    Allergies:  Allergies  Allergen Reactions   Crab (Diagnostic) Nausea And Vomiting   Ibuprofen     Gastritis     Medications Prior to Admission  Medication Sig Dispense Refill Last Dose   omeprazole (PRILOSEC) 20 MG capsule Take 1 capsule (20 mg total) by mouth daily. **PLEASE CONTACT THE OFFICE TO SCHEDULE A FOLLOW UP** 30 capsule 0 Past Week   ondansetron (ZOFRAN ODT) 4 MG disintegrating tablet Take 1 tablet (4 mg total) by mouth every 6 (six) hours as needed for nausea. 30 tablet 0    Prenatal Vit-Fe Fumarate-FA (PREPLUS) 27-1 MG TABS Take 1 tablet by mouth daily. 30 tablet 13     Review of Systems  Constitutional: Negative.  Negative for fatigue and fever.  HENT: Negative.    Respiratory: Negative.  Negative for shortness of breath.   Cardiovascular: Negative.  Negative for chest pain.  Gastrointestinal:  Positive for abdominal pain. Negative for constipation, diarrhea, nausea and vomiting.  Genitourinary:  Positive for vaginal bleeding. Negative for dysuria and vaginal discharge.  Neurological: Negative.  Negative for dizziness and headaches.  Physical Exam   Blood pressure 111/71, pulse 66, temperature 98.3 F (36.8 C), temperature source Oral, resp. rate 16, height 5' 0.5" (1.537 m), weight 53.8 kg, last menstrual period 12/30/2020, SpO2 99 %, unknown if currently breastfeeding.  Physical Exam Vitals and nursing note reviewed.  Constitutional:      General: She is not in acute distress.    Appearance: She is well-developed.  HENT:     Head: Normocephalic.  Eyes:     Pupils: Pupils are equal, round, and reactive to light.  Cardiovascular:     Rate and Rhythm: Normal rate and regular rhythm.     Heart sounds: Normal heart sounds.  Pulmonary:     Effort: Pulmonary effort is normal. No respiratory distress.     Breath sounds: Normal breath sounds.  Abdominal:     General: Bowel sounds are normal. There is no distension.      Palpations: Abdomen is soft.     Tenderness: There is no abdominal tenderness.  Skin:    General: Skin is warm and dry.  Neurological:     Mental Status: She is alert and oriented to person, place, and time.  Psychiatric:        Mood and Affect: Mood normal.        Behavior: Behavior normal.        Thought Content: Thought content normal.        Judgment: Judgment normal.    MAU Course  Procedures Results for orders placed or performed during the hospital encounter of 03/09/21 (from the past 24 hour(s))  Urinalysis, Routine w reflex microscopic Urine, Clean Catch     Status: Abnormal   Collection Time: 03/09/21  1:25 PM  Result Value Ref Range   Color, Urine YELLOW YELLOW   APPearance CLEAR CLEAR   Specific Gravity, Urine 1.024 1.005 - 1.030   pH 6.0 5.0 - 8.0   Glucose, UA NEGATIVE NEGATIVE mg/dL   Hgb urine dipstick NEGATIVE NEGATIVE   Bilirubin Urine NEGATIVE NEGATIVE   Ketones, ur 5 (A) NEGATIVE mg/dL   Protein, ur NEGATIVE NEGATIVE mg/dL   Nitrite NEGATIVE NEGATIVE   Leukocytes,Ua NEGATIVE NEGATIVE  CBC     Status: None   Collection Time: 03/09/21  1:33 PM  Result Value Ref Range   WBC 6.2 4.0 - 10.5 K/uL   RBC 4.01 3.87 - 5.11 MIL/uL   Hemoglobin 13.1 12.0 - 15.0 g/dL   HCT 16.1 09.6 - 04.5 %   MCV 95.0 80.0 - 100.0 fL   MCH 32.7 26.0 - 34.0 pg   MCHC 34.4 30.0 - 36.0 g/dL   RDW 40.9 81.1 - 91.4 %   Platelets 238 150 - 400 K/uL   nRBC 0.0 0.0 - 0.2 %  hCG, quantitative, pregnancy     Status: Abnormal   Collection Time: 03/09/21  1:33 PM  Result Value Ref Range   hCG, Beta Chain, Quant, S 169 (H) <5 mIU/mL    US OB LESS THAN 14 WEEKS WITH OB TRANSVAGINAL  Result Date: 03/09/2021 CLINICAL DATA:  Vaginal bleeding.  Reported abortion on 02/13/2021. EXAM: OBSTETRIC <14 WK [place] AND TRANSVAGINAL OB US TECHNIQUE: Both transabdominal and transvaginal ultrasound examinations were performed for complete evaluation of the gestation as well as the maternal uterus,  adnexal regions, and pelvic cul-de-sac. Transvaginal technique was performed to assess early pregnancy. COMPARISON:  None. FINDINGS: Intrauterine  gestational sac: None Yolk sac:  Not Visualized. Embryo:  Not Visualized. Maternal uterus/adnexae: Endometrium thickened and heterogeneous, consistent with hemorrhage with possible products of conception. There is some internal blood flow that supports retained products of conception. No uterine masses. Normal ovaries and adnexa.  No pelvic free fluid. IMPRESSION: 1. Heterogeneous appearance of the endometrium/endometrial canal consistent with a combination of fluid/products of hemorrhage and probable areas of retained products of conception. 2. No other abnormalities.  No signs of a viable pregnancy. Electronically Signed   By: Amie Portland M.D.   On: 03/09/2021 14:32     MDM UA CBC, HCG US OB Less 14 weeks with Transvaginal  Consulted with Dr. Para March- will give another dose of cytotec for retained POC  Discussed plan of care with patient. Patient agreeable. Requests medicine be sent to pharmacy because she needs to get childcare situated before she can take it.   Assessment and Plan   1. Retained products of conception following abortion   2. Vaginal bleeding affecting early pregnancy   3. [redacted] weeks gestation of pregnancy    -Discharge home in stable condition -Rx for cyototec and percoet sent to patient's pharmacy -Vaginal bleeding and pain precautions discussed -Patient advised to follow-up with [facility] in 1 week for HCG and 2 weeks with a provider, message sent -Patient may return to MAU as needed or if her condition were to change or worsen   Rolm Bookbinder CNM 03/09/2021, 3:02 PM

## 2021-03-10 ENCOUNTER — Other Ambulatory Visit: Payer: Self-pay

## 2021-03-11 ENCOUNTER — Other Ambulatory Visit: Payer: Self-pay

## 2021-03-11 ENCOUNTER — Inpatient Hospital Stay (HOSPITAL_COMMUNITY)
Admission: AD | Admit: 2021-03-11 | Discharge: 2021-03-11 | Disposition: A | Discharge status: Home / Self Care | Payer: Medicaid Other | Attending: Obstetrics and Gynecology | Admitting: Obstetrics and Gynecology

## 2021-03-11 DIAGNOSIS — Z886 Allergy status to analgesic agent status: Secondary | ICD-10-CM | POA: Insufficient documentation

## 2021-03-11 DIAGNOSIS — O074 Failed attempted termination of pregnancy without complication: Secondary | ICD-10-CM | POA: Diagnosis present

## 2021-03-11 MED ORDER — MISOPROSTOL 200 MCG PO TABS: 800.0000 ug | ORAL_TABLET | Freq: Once | ORAL | 0 refills | Status: DC

## 2021-03-11 NOTE — MAU Provider Note (Signed)
Chief Complaint: Abdominal Pain   Event Date/Time   First Provider Initiated Contact with Patient 03/11/21 2302        SUBJECTIVE HPI: Elizabeth Shaffer is a 25 y.o. G3P1001 at [redacted]w[redacted]d by LMP who presents to maternity admissions reporting failure to bleed after Cytotec treatment for an incomplete medical abortion. . She denies significant vaginal bleeding,  fever/chills.    She was seen on 02/08/21 for bleeding and viable IUP was seen She then went for a medical abortion at the clinic on [address] and noticed positive pregnancy tests so was told by clinic to come the the ED.  US showed Retained POC and she was prescribed Cytotec and told if she did not bleed to come back here.   Abdominal Pain This is a recurrent problem. The problem occurs intermittently. The pain is mild. The quality of the pain is cramping. Pertinent negatives include no fever, frequency or headaches. Nothing aggravates the pain. The pain is relieved by Nothing.   Past Medical History:  Diagnosis Date   Anemia    during pregnancy   Displaced fracture of fifth metacarpal bone of left hand    Gastritis    GERD (gastroesophageal reflux disease)    Headache    HTN (hypertension)    Migraines    PONV (postoperative nausea and vomiting)    Past Surgical History:  Procedure Laterality Date   hand     carpule tunnel   MOUTH SURGERY  2011   MULTIPLE TOOTH EXTRACTIONS     OPEN REDUCTION INTERNAL FIXATION (ORIF) METACARPAL Left 10/31/2019   Procedure: OPEN REDUCTION INTERNAL FIXATION (ORIF) LEFT FIFTH METACARPAL FRACTURE USING EXSOMED NAIL 4.72mm x 38mm;  Surgeon: Dominica Severin, MD;  Location: [facility];  Service: Orthopedics;  Laterality: Left;  60 mins   WISDOM TOOTH EXTRACTION     Social History   Socioeconomic History   Marital status: Single    Spouse name: Not on file   Number of children: 1   Years of education: Not on file   Highest education level: 12th grade  Occupational History   Occupation: Food service     Comment: [name]  Tobacco Use   Smoking status: Never   Smokeless tobacco: Never  Vaping Use   Vaping Use: Never used  Substance and Sexual Activity   Alcohol use: Not Currently    Comment: social   Drug use: Not Currently    Types: Marijuana    Comment: plans to quit   Sexual activity: Yes  Other Topics Concern   Not on file  Social History Narrative   Not on file   Social Determinants of Health   Financial Resource Strain: Not on file  Food Insecurity: No Food Insecurity   Worried About Running Out of Food in the Last Year: Never true   Ran Out of Food in the Last Year: Never true  Transportation Needs: No Transportation Needs   Lack of Transportation (Medical): No   Lack of Transportation (Non-Medical): No  Physical Activity: Not on file  Stress: Not on file  Social Connections: Not on file  Intimate Partner Violence: Not on file   No current facility-administered medications on file prior to encounter.   Current Outpatient Medications on File Prior to Encounter  Medication Sig Dispense Refill   omeprazole (PRILOSEC) 20 MG capsule Take 1 capsule (20 mg total) by mouth daily. **PLEASE CONTACT THE OFFICE TO SCHEDULE A FOLLOW UP** 30 capsule 0   ondansetron (ZOFRAN ODT) 4 MG  disintegrating tablet Take 1 tablet (4 mg total) by mouth every 6 (six) hours as needed for nausea. 30 tablet 0   oxyCODONE-acetaminophen (PERCOCET/ROXICET) 5-325 MG tablet Take 1 tablet by mouth every 6 (six) hours as needed for severe pain. 20 tablet 0   misoprostol (CYTOTEC) 200 MCG tablet Take 4 tablets (800 mcg total) by mouth once for 1 dose. 4 tablet 0   Prenatal Vit-Fe Fumarate-FA (PREPLUS) 27-1 MG TABS Take 1 tablet by mouth daily. 30 tablet 13   Allergies  Allergen Reactions   Crab (Diagnostic) Nausea And Vomiting   Ibuprofen     Gastritis     I have reviewed patient's Past Medical Hx, Surgical Hx, Family Hx, Social Hx, medications and allergies.   ROS:  Review of Systems   Constitutional:  Negative for fever.  Gastrointestinal:  Positive for abdominal pain.  Genitourinary:  Negative for frequency.  Neurological:  Negative for headaches.  Review of Systems  Other systems negative   Physical Exam  Physical Exam Patient Vitals for the past 24 hrs:  BP Temp Pulse Resp SpO2 Height Weight  03/11/21 2252 -- -- -- -- 98 % -- --  03/11/21 2248 107/63 97.9 F (36.6 C) 62 16 -- 5' 0.05" (1.525 m) 54 kg   Constitutional: Well-developed, well-nourished female in no acute distress.  Cardiovascular: normal rate Respiratory: normal effort GI: Abd soft, non-tender.  MS: Extremities nontender, no edema, normal ROM Neurologic: Alert and oriented x 4.  GU: Neg CVAT.  PELVIC EXAM: deferred, not indicated LAB RESULTS No results found for this or any previous visit (from the past 24 hour(s)).     IMAGING   MAU Management/MDM: Consulted Dr Jolayne Panther who recommends refilling Rx for Cytotec and having patient repeat dose Patient states she plans to go back to abortion clinic in AM to arrange followup  Reviewed warning signs to return for including fever, tenderness, heavy prolonged bleeding  ASSESSMENT Incomplete medical abortion  PLAN Discharge home Rx sent for Cytotec buccally Pt stable at time of discharge. Encouraged to return here if she develops worsening of symptoms, increase in pain, fever, or other concerning symptoms.    Wynelle Bourgeois CNM, MSN Certified Nurse-Midwife 03/11/2021  11:02 PM

## 2021-03-11 NOTE — MAU Note (Signed)
Marie Williams CNM in Triage to see pt and discuss plan of care 

## 2021-03-11 NOTE — Progress Notes (Signed)
Elizabeth Shaffer CNM talked with pt in Triage and then d/ced pt home.

## 2021-03-11 NOTE — MAU Note (Signed)
I was seen in MAU Sunday after taking pills for aborton 02/13/21. I was spotting on Sunday and given prescription for more "abortion pills" to get the rest of the pregnancy out. I was told if I did not have bleeding to return. I took the pills Sunday and still have not seen any bleeding. Having mild abd cramping.

## 2021-03-12 DIAGNOSIS — O074 Failed attempted termination of pregnancy without complication: Secondary | ICD-10-CM | POA: Diagnosis present

## 2021-03-25 ENCOUNTER — Telehealth: Payer: Self-pay | Admitting: Radiology

## 2021-03-25 NOTE — Telephone Encounter (Signed)
Left message for patient to call office to schedule appointment 2 week f/u after cytotec at [facility]

## 2021-04-07 ENCOUNTER — Emergency Department (HOSPITAL_COMMUNITY)
Admission: EM | Admit: 2021-04-07 | Discharge: 2021-04-08 | Disposition: A | Discharge status: Home / Self Care | Payer: Medicaid Other | Attending: Emergency Medicine | Admitting: Emergency Medicine

## 2021-04-07 ENCOUNTER — Encounter (HOSPITAL_COMMUNITY): Payer: Self-pay | Admitting: Oncology

## 2021-04-07 ENCOUNTER — Other Ambulatory Visit: Payer: Self-pay

## 2021-04-07 ENCOUNTER — Emergency Department (HOSPITAL_COMMUNITY): Payer: Medicaid Other

## 2021-04-07 DIAGNOSIS — Y92410 Unspecified street and highway as the place of occurrence of the external cause: Secondary | ICD-10-CM | POA: Insufficient documentation

## 2021-04-07 DIAGNOSIS — M549 Dorsalgia, unspecified: Secondary | ICD-10-CM | POA: Insufficient documentation

## 2021-04-07 DIAGNOSIS — Z5321 Procedure and treatment not carried out due to patient leaving prior to being seen by health care provider: Secondary | ICD-10-CM | POA: Diagnosis not present

## 2021-04-07 DIAGNOSIS — M25562 Pain in left knee: Secondary | ICD-10-CM | POA: Diagnosis not present

## 2021-04-07 DIAGNOSIS — S0083XA Contusion of other part of head, initial encounter: Secondary | ICD-10-CM | POA: Diagnosis not present

## 2021-04-07 DIAGNOSIS — M25561 Pain in right knee: Secondary | ICD-10-CM | POA: Diagnosis not present

## 2021-04-07 DIAGNOSIS — S0990XA Unspecified injury of head, initial encounter: Secondary | ICD-10-CM | POA: Diagnosis present
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
## 2021-04-08 ENCOUNTER — Encounter (HOSPITAL_BASED_OUTPATIENT_CLINIC_OR_DEPARTMENT_OTHER): Payer: Self-pay

## 2021-04-08 ENCOUNTER — Other Ambulatory Visit: Payer: Self-pay

## 2021-04-08 DIAGNOSIS — S0083XA Contusion of other part of head, initial encounter: Secondary | ICD-10-CM | POA: Insufficient documentation

## 2021-04-08 DIAGNOSIS — I10 Essential (primary) hypertension: Secondary | ICD-10-CM | POA: Insufficient documentation

## 2021-04-08 DIAGNOSIS — S161XXA Strain of muscle, fascia and tendon at neck level, initial encounter: Secondary | ICD-10-CM | POA: Insufficient documentation

## 2021-04-08 DIAGNOSIS — Y92410 Unspecified street and highway as the place of occurrence of the external cause: Secondary | ICD-10-CM | POA: Insufficient documentation

## 2021-04-08 DIAGNOSIS — S8001XA Contusion of right knee, initial encounter: Secondary | ICD-10-CM | POA: Insufficient documentation

## 2021-04-08 DIAGNOSIS — S39012A Strain of muscle, fascia and tendon of lower back, initial encounter: Secondary | ICD-10-CM | POA: Insufficient documentation
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
## 2021-04-09 ENCOUNTER — Emergency Department (HOSPITAL_BASED_OUTPATIENT_CLINIC_OR_DEPARTMENT_OTHER)
Admission: EM | Admit: 2021-04-09 | Discharge: 2021-04-09 | Disposition: A | Discharge status: Home / Self Care | Payer: Medicaid Other | Source: Home / Self Care | Attending: Emergency Medicine | Admitting: Emergency Medicine

## 2021-04-09 DIAGNOSIS — S8001XA Contusion of right knee, initial encounter: Secondary | ICD-10-CM

## 2021-04-09 DIAGNOSIS — S0083XA Contusion of other part of head, initial encounter: Secondary | ICD-10-CM

## 2021-04-09 DIAGNOSIS — S39012A Strain of muscle, fascia and tendon of lower back, initial encounter: Secondary | ICD-10-CM

## 2021-04-09 DIAGNOSIS — S161XXA Strain of muscle, fascia and tendon at neck level, initial encounter: Secondary | ICD-10-CM

## 2021-04-09 MED ORDER — CYCLOBENZAPRINE HCL 10 MG PO TABS
10.0000 mg | ORAL_TABLET | Freq: Once | ORAL | Status: AC
  Administered 2021-04-09: 10 mg via ORAL
  Filled 2021-04-09: qty 1

## 2021-04-09 MED ORDER — CYCLOBENZAPRINE HCL 10 MG PO TABS: 10.0000 mg | ORAL_TABLET | Freq: Three times a day (TID) | ORAL | 0 refills | Status: DC | PRN
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
## 2021-05-02 ENCOUNTER — Other Ambulatory Visit: Payer: Self-pay | Admitting: Physician Assistant

## 2021-08-20 ENCOUNTER — Other Ambulatory Visit: Payer: Self-pay | Admitting: Physician Assistant

## 2021-09-21 ENCOUNTER — Encounter (HOSPITAL_BASED_OUTPATIENT_CLINIC_OR_DEPARTMENT_OTHER): Payer: Self-pay

## 2021-09-21 ENCOUNTER — Other Ambulatory Visit: Payer: Self-pay

## 2021-09-21 ENCOUNTER — Emergency Department (HOSPITAL_BASED_OUTPATIENT_CLINIC_OR_DEPARTMENT_OTHER)
Admission: EM | Admit: 2021-09-21 | Discharge: 2021-09-21 | Disposition: A | Discharge status: Home / Self Care | Payer: Medicaid Other | Attending: Emergency Medicine | Admitting: Emergency Medicine

## 2021-09-21 DIAGNOSIS — R0981 Nasal congestion: Secondary | ICD-10-CM | POA: Insufficient documentation

## 2021-09-21 DIAGNOSIS — H5789 Other specified disorders of eye and adnexa: Secondary | ICD-10-CM | POA: Diagnosis not present

## 2021-09-21 DIAGNOSIS — Z20822 Contact with and (suspected) exposure to covid-19: Secondary | ICD-10-CM | POA: Diagnosis not present

## 2021-09-21 DIAGNOSIS — B349 Viral infection, unspecified: Secondary | ICD-10-CM

## 2021-09-21 LAB — RESP PANEL BY RT-PCR (FLU A&B, COVID) ARPGX2
Influenza A by PCR: NEGATIVE
Influenza B by PCR: NEGATIVE
SARS Coronavirus 2 by RT PCR: NEGATIVE

## 2021-09-21 NOTE — ED Triage Notes (Signed)
Patient here POV from Home with Congestion and Eye Swelling. ? ?Endorses Left Eye Swelling for approximately 24 hours. No Visual Aids. No Redness or Drainage. ? ?Also endorses Congestion for approximately 3 days. Non-Productive Cough. No Known Fevers. ? ?NAD Noted during Triage. A&Ox4. GCS 15. Ambulatory. ?

## 2021-09-21 NOTE — ED Notes (Signed)
Dc instructions reviewed with patient. Patient voiced understanding. Dc with belongings.  °

## 2021-09-21 NOTE — ED Provider Notes (Signed)
?[facility] EMERGENCY DEPT ?Provider Note ? ? ?CSN: [id number] ?Arrival date & time: 09/21/21  1142 ? ?  ? ?History ? ?Chief Complaint  ?Patient presents with  ? Congestion  ? Eye Swelling  ? ? ?Elizabeth Shaffer is a 26 y.o. female. ? ?26 year old female presents with URI symptoms x24 hours.  Has had congestion as well as some eye swelling.  Denies any purulent drainage.  No vomiting or diarrhea.  No fever or chills.  Has not been short of breath. ? ? ?  ? ?Home Medications ?Prior to Admission medications   ?Medication Sig Start Date End Date Taking? Authorizing Provider  ?cyclobenzaprine (FLEXERIL) 10 MG tablet Take 1 tablet (10 mg total) by mouth 3 (three) times daily as needed for muscle spasms. 04/09/21   Molpus, John, MD  ?misoprostol (CYTOTEC) 200 MCG tablet Take 4 tablets (800 mcg total) by mouth once for 1 dose. Inside cheek 03/11/21 03/11/21  Aviva Signs, CNM  ?omeprazole (PRILOSEC) 20 MG capsule Take 1 capsule (20 mg total) by mouth daily. **PLEASE CONTACT THE OFFICE TO SCHEDULE A FOLLOW UP** 12/09/20   Esterwood, Amy S, PA-C  ?ondansetron (ZOFRAN ODT) 4 MG disintegrating tablet Take 1 tablet (4 mg total) by mouth every 6 (six) hours as needed for nausea. 02/08/21   Marylene Land, CNM  ?oxyCODONE-acetaminophen (PERCOCET/ROXICET) 5-325 MG tablet Take 1 tablet by mouth every 6 (six) hours as needed for severe pain. 03/09/21   Rolm Bookbinder, CNM  ?   ? ?Allergies    ?Crab (diagnostic) and Ibuprofen   ? ?Review of Systems   ?Review of Systems  ?All other systems reviewed and are negative. ? ?Physical Exam ?Updated Vital Signs ?BP 125/81   Pulse 83   Temp 98.4 ?F (36.9 ?C)   Resp 16   Ht 1.537 m (5' 0.5")   Wt 54.9 kg   LMP 12/30/2020 Comment: recent abortion, with pills  SpO2 93%   BMI 23.25 kg/m?  ?Physical Exam ?Vitals and nursing note reviewed.  ?Constitutional:   ?   General: She is not in acute distress. ?   Appearance: Normal appearance. She is well-developed. She is not  toxic-appearing.  ?HENT:  ?   Head: Normocephalic and atraumatic.  ?Eyes:  ?   General: Lids are normal.  ?   Conjunctiva/sclera: Conjunctivae normal.  ?   Pupils: Pupils are equal, round, and reactive to light.  ?Neck:  ?   Thyroid: No thyroid mass.  ?   Trachea: No tracheal deviation.  ?Cardiovascular:  ?   Rate and Rhythm: Normal rate and regular rhythm.  ?   Heart sounds: Normal heart sounds. No murmur heard. ?  No gallop.  ?Pulmonary:  ?   Effort: Pulmonary effort is normal. No respiratory distress.  ?   Breath sounds: Normal breath sounds. No stridor. No decreased breath sounds, wheezing, rhonchi or rales.  ?Abdominal:  ?   General: There is no distension.  ?   Palpations: Abdomen is soft.  ?   Tenderness: There is no abdominal tenderness. There is no rebound.  ?Musculoskeletal:     ?   General: No tenderness. Normal range of motion.  ?   Cervical back: Normal range of motion and neck supple.  ?Skin: ?   General: Skin is warm and dry.  ?   Findings: No abrasion or rash.  ?Neurological:  ?   Mental Status: She is alert and oriented to person, place, and time. Mental status is at baseline.  ?  GCS: GCS eye subscore is 4. GCS verbal subscore is 5. GCS motor subscore is 6.  ?   Cranial Nerves: No cranial nerve deficit.  ?   Sensory: No sensory deficit.  ?   Motor: Motor function is intact.  ?Psychiatric:     ?   Attention and Perception: Attention normal.     ?   Speech: Speech normal.     ?   Behavior: Behavior normal.  ? ? ?ED Results / Procedures / Treatments   ?Labs ?(all labs ordered are listed, but only abnormal results are displayed) ?Labs Reviewed  ?RESP PANEL BY RT-PCR (FLU A&B, COVID) [id number]  ? ? ?EKG ?None ? ?Radiology ?No results found. ? ?Procedures ?Procedures  ? ? ?Medications Ordered in ED ?Medications - No data to display ? ?ED Course/ Medical Decision Making/ A&P ?  ?                        ?Medical Decision Making ? ?Patient's COVID and flu test were negative..  She is afebrile.  Suspect viral  etiology.  No evidence of conjunctivitis.  Will discharge ? ? ? ? ? ? ? ?Final Clinical Impression(s) / ED Diagnoses ?Final diagnoses:  ?None  ? ? ?Rx / DC Orders ?ED Discharge Orders   ? ? None  ? ?  ? ? ?  ?Lorre Nick, MD ?09/21/21 1618 ? ?

## 2021-11-03 ENCOUNTER — Encounter (HOSPITAL_COMMUNITY): Payer: Self-pay

## 2021-11-03 ENCOUNTER — Emergency Department (HOSPITAL_COMMUNITY)
Admission: EM | Admit: 2021-11-03 | Discharge: 2021-11-03 | Disposition: A | Discharge status: Home / Self Care | Payer: Medicaid Other | Attending: Emergency Medicine | Admitting: Emergency Medicine

## 2021-11-03 DIAGNOSIS — K2900 Acute gastritis without bleeding: Secondary | ICD-10-CM | POA: Insufficient documentation

## 2021-11-03 DIAGNOSIS — N9489 Other specified conditions associated with female genital organs and menstrual cycle: Secondary | ICD-10-CM | POA: Insufficient documentation

## 2021-11-03 DIAGNOSIS — R109 Unspecified abdominal pain: Secondary | ICD-10-CM | POA: Diagnosis present

## 2021-11-03 DIAGNOSIS — R1084 Generalized abdominal pain: Secondary | ICD-10-CM

## 2021-11-03 LAB — LIPASE, BLOOD: Lipase: 39 U/L (ref 11–51)

## 2021-11-03 LAB — COMPREHENSIVE METABOLIC PANEL
ALT: 16 U/L (ref 0–44)
AST: 15 U/L (ref 15–41)
Albumin: 4.1 g/dL (ref 3.5–5.0)
Alkaline Phosphatase: 53 U/L (ref 38–126)
Anion gap: 4 — ABNORMAL LOW (ref 5–15)
BUN: 9 mg/dL (ref 6–20)
CO2: 26 mmol/L (ref 22–32)
Calcium: 9.2 mg/dL (ref 8.9–10.3)
Chloride: 112 mmol/L — ABNORMAL HIGH (ref 98–111)
Creatinine, Ser: 0.91 mg/dL (ref 0.44–1.00)
GFR, Estimated: >60 mL/min (ref >60)
Glucose, Bld: 103 mg/dL — ABNORMAL HIGH (ref 70–99)
Potassium: 4 mmol/L (ref 3.5–5.1)
Sodium: 142 mmol/L (ref 135–145)
Total Bilirubin: 1 mg/dL (ref 0.3–1.2)
Total Protein: 7.3 g/dL (ref 6.5–8.1)

## 2021-11-03 LAB — CBC WITH DIFFERENTIAL/PLATELET
Abs Immature Granulocytes: 0 10*3/uL (ref 0.00–0.07)
Basophils Absolute: 0 10*3/uL (ref 0.0–0.1)
Basophils Relative: 0 %
Eosinophils Absolute: 0 10*3/uL (ref 0.0–0.5)
Eosinophils Relative: 0 %
HCT: 37.2 % (ref 36.0–46.0)
Hemoglobin: 12.8 g/dL (ref 12.0–15.0)
Immature Granulocytes: 0 %
Lymphocytes Relative: 38 %
Lymphs Abs: 1.9 10*3/uL (ref 0.7–4.0)
MCH: 32.4 pg (ref 26.0–34.0)
MCHC: 34.4 g/dL (ref 30.0–36.0)
MCV: 94.2 fL (ref 80.0–100.0)
Monocytes Absolute: 0.5 10*3/uL (ref 0.1–1.0)
Monocytes Relative: 9 %
Neutro Abs: 2.7 10*3/uL (ref 1.7–7.7)
Neutrophils Relative %: 53 %
Platelets: 186 10*3/uL (ref 150–400)
RBC: 3.95 MIL/uL (ref 3.87–5.11)
RDW: 11.9 % (ref 11.5–15.5)
WBC: 5 10*3/uL (ref 4.0–10.5)
nRBC: 0 % (ref 0.0–0.2)

## 2021-11-03 LAB — I-STAT BETA HCG BLOOD, ED (MC, WL, AP ONLY): I-stat hCG, quantitative: <5 m[IU]/mL (ref <5)

## 2021-11-03 MED ORDER — LIDOCAINE VISCOUS HCL 2 % MT SOLN
15.0000 mL | Freq: Once | OROMUCOSAL | Status: AC
  Administered 2021-11-03: 15 mL via ORAL
  Filled 2021-11-03: qty 15

## 2021-11-03 MED ORDER — ALUM & MAG HYDROXIDE-SIMETH 200-200-20 MG/5ML PO SUSP
30.0000 mL | Freq: Once | ORAL | Status: AC
  Administered 2021-11-03: 30 mL via ORAL
  Filled 2021-11-03: qty 30

## 2021-11-03 NOTE — ED Provider Notes (Signed)
[facility]-EMERGENCY DEPT Provider Note   CSN: [id number] Arrival date & time: 11/03/21  1603     History  Chief Complaint  Patient presents with  . Abdominal Pain    Elizabeth Shaffer is a 26 y.o. female.   Abdominal Pain  26 year old female presenting to the emergency department with sudden onset abdominal pain that began around 1 PM.  The patient states that she has a history of gastritis and states that symptoms felt similar to prior episodes.  She denies any nausea, vomiting, diarrhea, dysuria, flank pain, pelvic pain.  She denies any vaginal discharge.  She is currently on her menstrual period.  She denies any fevers or chills.  She is tolerating oral intake.  She states the pain is since resolved and she is currently pain-free.  Home Medications Prior to Admission medications   Medication Sig Start Date End Date Taking? Authorizing Provider  omeprazole (PRILOSEC) 20 MG capsule Take 1 capsule (20 mg total) by mouth daily. **PLEASE CONTACT THE OFFICE TO SCHEDULE A FOLLOW UP** 12/09/20  Yes Esterwood, Amy S, PA-C  cyclobenzaprine (FLEXERIL) 10 MG tablet Take 1 tablet (10 mg total) by mouth 3 (three) times daily as needed for muscle spasms. Patient not taking: Reported on 11/03/2021 04/09/21   Molpus, Jonny Ruiz, MD  misoprostol (CYTOTEC) 200 MCG tablet Take 4 tablets (800 mcg total) by mouth once for 1 dose. Inside cheek Patient not taking: Reported on 11/03/2021 03/11/21 03/11/21  Aviva Signs, CNM  oxyCODONE-acetaminophen (PERCOCET/ROXICET) 5-325 MG tablet Take 1 tablet by mouth every 6 (six) hours as needed for severe pain. Patient not taking: Reported on 11/03/2021 03/09/21   Rolm Bookbinder, CNM      Allergies    Shellfish allergy, Crab (diagnostic), and Ibuprofen    Review of Systems   Review of Systems  Gastrointestinal:  Positive for abdominal pain.  All other systems reviewed and are negative.  Physical Exam Updated Vital Signs BP 112/70   Pulse 72    Temp 98.8 F (37.1 C) (Oral)   Resp 17   Ht 5' 0.5" (1.537 m)   Wt 54 kg   LMP 12/30/2020   SpO2 100%   BMI 22.86 kg/m  Physical Exam Vitals and nursing note reviewed.  Constitutional:      General: She is not in acute distress.    Appearance: She is well-developed.  HENT:     Head: Normocephalic and atraumatic.  Eyes:     Conjunctiva/sclera: Conjunctivae normal.  Cardiovascular:     Rate and Rhythm: Normal rate and regular rhythm.     Heart sounds: No murmur heard. Pulmonary:     Effort: Pulmonary effort is normal. No respiratory distress.     Breath sounds: Normal breath sounds.  Abdominal:     Palpations: Abdomen is soft.     Tenderness: There is no abdominal tenderness.     Comments: Abdomen soft, nontender, nondistended, no rebound or guarding, no Murphy sign, no CVA tenderness  Musculoskeletal:        General: No swelling.     Cervical back: Neck supple.  Skin:    General: Skin is warm and dry.     Capillary Refill: Capillary refill takes less than 2 seconds.  Neurological:     Mental Status: She is alert.  Psychiatric:        Mood and Affect: Mood normal.    ED Results / Procedures / Treatments   Labs (all labs ordered are listed, but only abnormal  results are displayed) Labs Reviewed  COMPREHENSIVE METABOLIC PANEL  LIPASE, BLOOD  CBC WITH DIFFERENTIAL/PLATELET  I-STAT BETA HCG BLOOD, ED ([facility], [facility], [facility] ONLY)    EKG None  Radiology No results found.  Procedures Procedures    Medications Ordered in ED Medications  alum & mag hydroxide-simeth (MAALOX/MYLANTA) 200-200-20 MG/5ML suspension 30 mL (has no administration in time range)    And  lidocaine (XYLOCAINE) 2 % viscous mouth solution 15 mL (has no administration in time range)    ED Course/ Medical Decision Making/ A&P                           Medical Decision Making Amount and/or Complexity of Data Reviewed Labs: ordered.  Risk OTC drugs. Prescription drug  management.    26 year old female presenting to the emergency department with sudden onset abdominal pain that began around 1 PM.  The patient states that she has a history of gastritis and states that symptoms felt similar to prior episodes.  She denies any nausea, vomiting, diarrhea, dysuria, flank pain, pelvic pain.  She denies any vaginal discharge.  She is currently on her menstrual period.  She denies any fevers or chills.  She is tolerating oral intake.  She states the pain is since resolved and she is currently pain-free.   {Document critical care time when appropriate:1} {Document review of labs and clinical decision tools ie heart score, Chads2Vasc2 etc:1}  {Document your independent review of radiology images, and any outside records:1} {Document your discussion with family members, caretakers, and with consultants:1} {Document social determinants of health affecting pt's care:1} {Document your decision making why or why not admission, treatments were needed:1} Final Clinical Impression(s) / ED Diagnoses Final diagnoses:  None    Rx / DC Orders ED Discharge Orders     None

## 2021-11-03 NOTE — ED Triage Notes (Signed)
Pt arrived via POV, c/o diffuse abd pain since 1pm. Pt denies any n/v or diarrhea.

## 2021-11-03 NOTE — Discharge Instructions (Addendum)
Your pregnancy test was negative and your initial CBC was also reassuring.  Return to the emergency department for worsening symptoms for CT imaging.  Symptoms could be due to gastritis, GERD or gas pain.  Continue to take your Prilosec.  Follow-up with your PCP or consider referral to gastroenterology if persistent symptoms, return for worsening sudden onset abdominal pain, nausea, vomiting, inability tolerate oral intake.

## 2022-01-20 ENCOUNTER — Ambulatory Visit (HOSPITAL_COMMUNITY)
Admission: EM | Admit: 2022-01-20 | Discharge: 2022-01-20 | Disposition: A | Discharge status: Home / Self Care | Payer: Medicaid Other | Attending: Emergency Medicine | Admitting: Emergency Medicine

## 2022-01-20 ENCOUNTER — Encounter (HOSPITAL_COMMUNITY): Payer: Self-pay

## 2022-01-20 DIAGNOSIS — J069 Acute upper respiratory infection, unspecified: Secondary | ICD-10-CM | POA: Diagnosis not present

## 2022-01-20 LAB — POCT RAPID STREP A, ED / UC: Streptococcus, Group A Screen (Direct): NEGATIVE

## 2022-01-20 NOTE — Discharge Instructions (Signed)
Continue symptomatic care at home.  You can return to the urgent care if symptoms persist despite medication.

## 2022-01-20 NOTE — ED Triage Notes (Signed)
Symptoms onset yesterday with headache, chills, body aches, fever. Patient having pain in the right side of the throat with swallowing.   No known sick exposure.

## 2022-01-20 NOTE — ED Provider Notes (Signed)
[facility]    CSN: [id number] Arrival date & time: 01/20/22  1508      History   Chief Complaint Chief Complaint  Patient presents with   Sore Throat   Chills   Headache   Fever    HPI Terrian Ridlon is a 26 y.o. female.  Presents with 1 day history of chills, sore throat, body aches, headache. Pain with swallowing. Reports subjective fever last night. She tried TheraFlu for her symptoms last night.  Has not tried any other medicines.  No known sick contacts. No shortness of breath, cough, abdominal pain, vomiting, diarrhea.  Past Medical History:  Diagnosis Date   Anemia    during pregnancy   Displaced fracture of fifth metacarpal bone of left hand    Gastritis    GERD (gastroesophageal reflux disease)    Headache    HTN (hypertension)    Migraines    PONV (postoperative nausea and vomiting)     Patient Active Problem List   Diagnosis Date Noted   Incomplete legal abortion 03/12/2021   Closed fracture of fifth metacarpal bone 10/26/2019   Encounter for annual routine gynecological examination 09/07/2019   Pap smear for cervical cancer screening 09/07/2019   Migraine with aura and without status migrainosus, not intractable 03/15/2019   History of gestational hypertension 01/09/2019   GERD (gastroesophageal reflux disease) 08/19/2017   Seasonal allergies 10/25/2012    Past Surgical History:  Procedure Laterality Date   hand     carpule tunnel   MOUTH SURGERY  2011   MULTIPLE TOOTH EXTRACTIONS     OPEN REDUCTION INTERNAL FIXATION (ORIF) METACARPAL Left 10/31/2019   Procedure: OPEN REDUCTION INTERNAL FIXATION (ORIF) LEFT FIFTH METACARPAL FRACTURE USING EXSOMED NAIL 4.68mm x 75mm;  Surgeon: Dominica Severin, MD;  Location: [facility];  Service: Orthopedics;  Laterality: Left;  60 mins   WISDOM TOOTH EXTRACTION      OB History     Gravida  3   Para  1   Term  1   Preterm  0   AB  0   Living  1      SAB  0   IAB  0   Ectopic  0    Multiple  1   Live Births  1            Home Medications    Prior to Admission medications   Medication Sig Start Date End Date Taking? Authorizing Provider  omeprazole (PRILOSEC) 20 MG capsule Take 1 capsule (20 mg total) by mouth daily. **PLEASE CONTACT THE OFFICE TO SCHEDULE A FOLLOW UP** 12/09/20  Yes Esterwood, Amy S, PA-C  cyclobenzaprine (FLEXERIL) 10 MG tablet Take 1 tablet (10 mg total) by mouth 3 (three) times daily as needed for muscle spasms. Patient not taking: Reported on 11/03/2021 04/09/21   Molpus, Jonny Ruiz, MD  misoprostol (CYTOTEC) 200 MCG tablet Take 4 tablets (800 mcg total) by mouth once for 1 dose. Inside cheek Patient not taking: Reported on 11/03/2021 03/11/21 03/11/21  Aviva Signs, CNM  oxyCODONE-acetaminophen (PERCOCET/ROXICET) 5-325 MG tablet Take 1 tablet by mouth every 6 (six) hours as needed for severe pain. Patient not taking: Reported on 11/03/2021 03/09/21   Rolm Bookbinder, CNM    Family History Family History  Adopted: Yes  Problem Relation Age of Onset   Hypertension Mother     Social History Social History   Tobacco Use   Smoking status: Never    Passive exposure: Never  Smokeless tobacco: Never  Vaping Use   Vaping Use: Never used  Substance Use Topics   Alcohol use: Not Currently    Comment: social   Drug use: Yes    Frequency: 7.0 times per week    Types: Marijuana    Comment: plans to quit     Allergies   Shellfish allergy, Crab (diagnostic), and Ibuprofen   Review of Systems Review of Systems Per HPI  Physical Exam Triage Vital Signs ED Triage Vitals  Enc Vitals Group     BP 01/20/22 1525 120/89     Pulse Rate 01/20/22 1525 75     Resp --      Temp 01/20/22 1525 99.2 F (37.3 C)     Temp Source 01/20/22 1525 Oral     SpO2 01/20/22 1525 100 %     Weight 01/20/22 1529 116 lb (52.6 kg)     Height 01/20/22 1529 5' 0.5" (1.537 m)     Head Circumference --      Peak Flow --      Pain Score 01/20/22 1529 10      Pain Loc --      Pain Edu? --      Excl. in GC? --    No data found.  Updated Vital Signs BP 120/89 (BP Location: Left Arm)   Pulse 75   Temp 99.2 F (37.3 C) (Oral)   Ht 5' 0.5" (1.537 m)   Wt 116 lb (52.6 kg)   LMP 01/13/2022 (Exact Date)   SpO2 100%   BMI 22.28 kg/m     Physical Exam Vitals and nursing note reviewed.  Constitutional:      General: She is not in acute distress.    Appearance: Normal appearance. She is well-developed.  HENT:     Nose: Congestion present.     Mouth/Throat:     Pharynx: Oropharynx is clear. Posterior oropharyngeal erythema present.     Tonsils: No tonsillar exudate or tonsillar abscesses.  Eyes:     Conjunctiva/sclera: Conjunctivae normal.     Pupils: Pupils are equal, round, and reactive to light.  Cardiovascular:     Rate and Rhythm: Normal rate and regular rhythm.     Pulses: Normal pulses.     Heart sounds: Normal heart sounds.  Pulmonary:     Effort: Pulmonary effort is normal.     Breath sounds: Normal breath sounds.  Abdominal:     Tenderness: There is no abdominal tenderness.  Lymphadenopathy:     Cervical: No cervical adenopathy.  Skin:    General: Skin is warm and dry.  Neurological:     Mental Status: She is alert and oriented to person, place, and time.      UC Treatments / Results  Labs (all labs ordered are listed, but only abnormal results are displayed) Labs Reviewed  POCT RAPID STREP A, ED / UC    EKG  Radiology No results found.  Procedures Procedures (including critical care time)  Medications Ordered in UC Medications - No data to display  Initial Impression / Assessment and Plan / UC Course  I have reviewed the triage vital signs and the nursing notes.  Pertinent labs & imaging results that were available during my care of the patient were reviewed by me and considered in my medical decision making (see chart for details).  Strep test negative.  Patient declined COVID test.  Likely viral  etiology of her symptoms.  Recommend symptomatic care at home.  We  discussed monitoring for any worsening symptoms.  Patient agrees to plan  Final Clinical Impressions(s) / UC Diagnoses   Final diagnoses:  Viral URI     Discharge Instructions      Continue symptomatic care at home.  You can return to the urgent care if symptoms persist despite medication.     ED Prescriptions   None    PDMP not reviewed this encounter.   Amma Crear, Lurena Joiner, [place] 01/20/22 1627

## 2022-01-25 ENCOUNTER — Encounter (HOSPITAL_COMMUNITY): Payer: Self-pay | Admitting: Emergency Medicine

## 2022-01-25 ENCOUNTER — Emergency Department (HOSPITAL_COMMUNITY)
Admission: EM | Admit: 2022-01-25 | Discharge: 2022-01-26 | Disposition: A | Discharge status: Home / Self Care | Payer: Medicaid Other | Attending: Emergency Medicine | Admitting: Emergency Medicine

## 2022-01-25 ENCOUNTER — Emergency Department (HOSPITAL_COMMUNITY): Payer: Medicaid Other

## 2022-01-25 ENCOUNTER — Ambulatory Visit (HOSPITAL_COMMUNITY)
Admission: EM | Admit: 2022-01-25 | Discharge: 2022-01-25 | Disposition: A | Discharge status: Home / Self Care | Payer: Medicaid Other | Attending: Family Medicine | Admitting: Family Medicine

## 2022-01-25 ENCOUNTER — Encounter (HOSPITAL_COMMUNITY): Payer: Self-pay

## 2022-01-25 ENCOUNTER — Ambulatory Visit (INDEPENDENT_AMBULATORY_CARE_PROVIDER_SITE_OTHER): Payer: Medicaid Other

## 2022-01-25 ENCOUNTER — Other Ambulatory Visit: Payer: Self-pay

## 2022-01-25 DIAGNOSIS — R0789 Other chest pain: Secondary | ICD-10-CM | POA: Diagnosis not present

## 2022-01-25 DIAGNOSIS — R072 Precordial pain: Secondary | ICD-10-CM | POA: Diagnosis present

## 2022-01-25 DIAGNOSIS — I10 Essential (primary) hypertension: Secondary | ICD-10-CM | POA: Diagnosis not present

## 2022-01-25 LAB — COMPREHENSIVE METABOLIC PANEL
ALT: 21 U/L (ref 0–44)
AST: 18 U/L (ref 15–41)
Albumin: 4.2 g/dL (ref 3.5–5.0)
Alkaline Phosphatase: 64 U/L (ref 38–126)
Anion gap: 5 (ref 5–15)
BUN: 12 mg/dL (ref 6–20)
CO2: 27 mmol/L (ref 22–32)
Calcium: 9.4 mg/dL (ref 8.9–10.3)
Chloride: 107 mmol/L (ref 98–111)
Creatinine, Ser: 0.75 mg/dL (ref 0.44–1.00)
GFR, Estimated: >60 mL/min (ref >60)
Glucose, Bld: 91 mg/dL (ref 70–99)
Potassium: 4.1 mmol/L (ref 3.5–5.1)
Sodium: 139 mmol/L (ref 135–145)
Total Bilirubin: 0.5 mg/dL (ref 0.3–1.2)
Total Protein: 8 g/dL (ref 6.5–8.1)

## 2022-01-25 LAB — CBC WITH DIFFERENTIAL/PLATELET
Abs Immature Granulocytes: 0.01 10*3/uL (ref 0.00–0.07)
Basophils Absolute: 0.1 10*3/uL (ref 0.0–0.1)
Basophils Relative: 1 %
Eosinophils Absolute: 0.1 10*3/uL (ref 0.0–0.5)
Eosinophils Relative: 1 %
HCT: 40.1 % (ref 36.0–46.0)
Hemoglobin: 13.8 g/dL (ref 12.0–15.0)
Immature Granulocytes: 0 %
Lymphocytes Relative: 37 %
Lymphs Abs: 3.3 10*3/uL (ref 0.7–4.0)
MCH: 32.6 pg (ref 26.0–34.0)
MCHC: 34.4 g/dL (ref 30.0–36.0)
MCV: 94.8 fL (ref 80.0–100.0)
Monocytes Absolute: 0.6 10*3/uL (ref 0.1–1.0)
Monocytes Relative: 7 %
Neutro Abs: 4.7 10*3/uL (ref 1.7–7.7)
Neutrophils Relative %: 54 %
Platelets: 210 10*3/uL (ref 150–400)
RBC: 4.23 MIL/uL (ref 3.87–5.11)
RDW: 11.8 % (ref 11.5–15.5)
WBC: 8.8 10*3/uL (ref 4.0–10.5)
nRBC: 0 % (ref 0.0–0.2)

## 2022-01-25 LAB — TROPONIN I (HIGH SENSITIVITY): Troponin I (High Sensitivity): <2 ng/L (ref <18)

## 2022-01-25 LAB — LIPASE, BLOOD: Lipase: 36 U/L (ref 11–51)

## 2022-01-25 MED ORDER — POTASSIUM CHLORIDE CRYS ER 20 MEQ PO TBCR: 40.0000 meq | EXTENDED_RELEASE_TABLET | Freq: Once | ORAL | Status: DC

## 2022-01-25 MED ORDER — SUCRALFATE 1 G PO TABS: 1.0000 g | ORAL_TABLET | Freq: Two times a day (BID) | ORAL | 1 refills | Status: DC

## 2022-01-25 MED ORDER — ALUM & MAG HYDROXIDE-SIMETH 200-200-20 MG/5ML PO SUSP
30.0000 mL | Freq: Once | ORAL | Status: AC
Start: 2022-01-25 — End: 2022-01-25
  Administered 2022-01-25: 30 mL via ORAL
  Filled 2022-01-25: qty 30

## 2022-01-25 MED ORDER — LACTATED RINGERS IV BOLUS
1000.0000 mL | Freq: Once | INTRAVENOUS | Status: AC
  Administered 2022-01-25: 1000 mL via INTRAVENOUS

## 2022-01-25 MED ORDER — SUCRALFATE 1 G PO TABS
1.0000 g | ORAL_TABLET | Freq: Once | ORAL | Status: AC
  Administered 2022-01-26: 1 g via ORAL
  Filled 2022-01-25: qty 1

## 2022-01-25 MED ORDER — PANTOPRAZOLE SODIUM 40 MG IV SOLR
40.0000 mg | Freq: Once | INTRAVENOUS | Status: AC
  Administered 2022-01-25: 40 mg via INTRAVENOUS
  Filled 2022-01-25: qty 10

## 2022-01-25 NOTE — ED Provider Notes (Signed)
[facility]    CSN: [id number] Arrival date & time: 01/25/22  1005      History   Chief Complaint No chief complaint on file.   HPI Elizabeth Shaffer is a 26 y.o. female.   HPI Here for a feeling of trapped gas in her chest.  Since yesterday she has felt like there is air or gas in her chest that needs to escape, especially when she swallows.  She does not differentiate and how it is to swallow solids or liquids.  She has been burping but it does not seem to actually release any gas or release this pressure she is feeling.    She has not had any vomiting.  No nausea.  She does take Prilosec 20 mg routinely already.  She was seen here August 8 with sore throat, chills and fever.  Those symptoms resolved on August 7.  She states she has a little cough now, but that is mainly her trying to relieve the pressure also.  Last menstrual cycle was August 1 Past Medical History:  Diagnosis Date   Anemia    during pregnancy   Displaced fracture of fifth metacarpal bone of left hand    Gastritis    GERD (gastroesophageal reflux disease)    Headache    HTN (hypertension)    Migraines    PONV (postoperative nausea and vomiting)     Patient Active Problem List   Diagnosis Date Noted   Incomplete legal abortion 03/12/2021   Closed fracture of fifth metacarpal bone 10/26/2019   Encounter for annual routine gynecological examination 09/07/2019   Pap smear for cervical cancer screening 09/07/2019   Migraine with aura and without status migrainosus, not intractable 03/15/2019   History of gestational hypertension 01/09/2019   GERD (gastroesophageal reflux disease) 08/19/2017   Seasonal allergies 10/25/2012    Past Surgical History:  Procedure Laterality Date   hand     carpule tunnel   MOUTH SURGERY  2011   MULTIPLE TOOTH EXTRACTIONS     OPEN REDUCTION INTERNAL FIXATION (ORIF) METACARPAL Left 10/31/2019   Procedure: OPEN REDUCTION INTERNAL FIXATION (ORIF) LEFT FIFTH  METACARPAL FRACTURE USING EXSOMED NAIL 4.60mm x 33mm;  Surgeon: Dominica Severin, MD;  Location: [facility];  Service: Orthopedics;  Laterality: Left;  60 mins   WISDOM TOOTH EXTRACTION      OB History     Gravida  3   Para  1   Term  1   Preterm  0   AB  0   Living  1      SAB  0   IAB  0   Ectopic  0   Multiple  1   Live Births  1            Home Medications    Prior to Admission medications   Medication Sig Start Date End Date Taking? Authorizing Provider  sucralfate (CARAFATE) 1 g tablet Take 1 tablet (1 g total) by mouth 2 (two) times daily. 01/25/22  Yes Zenia Resides, MD  omeprazole (PRILOSEC) 20 MG capsule Take 1 capsule (20 mg total) by mouth daily. **PLEASE CONTACT THE OFFICE TO SCHEDULE A FOLLOW UP** 12/09/20   Esterwood, Amy S, PA-C    Family History Family History  Adopted: Yes  Problem Relation Age of Onset   Hypertension Mother     Social History Social History   Tobacco Use   Smoking status: Never    Passive exposure: Never   Smokeless tobacco:  Never  Vaping Use   Vaping Use: Never used  Substance Use Topics   Alcohol use: Not Currently    Comment: social   Drug use: Yes    Frequency: 7.0 times per week    Types: Marijuana    Comment: plans to quit     Allergies   Shellfish allergy, Crab (diagnostic), and Ibuprofen   Review of Systems Review of Systems   Physical Exam Triage Vital Signs ED Triage Vitals  Enc Vitals Group     BP 01/25/22 1023 125/81     Pulse Rate 01/25/22 1023 76     Resp 01/25/22 1023 15     Temp 01/25/22 1023 97.6 F (36.4 C)     Temp Source 01/25/22 1023 Oral     SpO2 01/25/22 1023 96 %     Weight --      Height --      Head Circumference --      Peak Flow --      Pain Score 01/25/22 1022 9     Pain Loc --      Pain Edu? --      Excl. in GC? --    No data found.  Updated Vital Signs BP 125/81 (BP Location: Right Arm)   Pulse 76   Temp 97.6 F (36.4 C) (Oral)   Resp 15   LMP  01/13/2022 (Exact Date)   SpO2 96%   Visual Acuity Right Eye Distance:   Left Eye Distance:   Bilateral Distance:    Right Eye Near:   Left Eye Near:    Bilateral Near:     Physical Exam Vitals and nursing note reviewed.  Constitutional:      Comments: She is well-appearing and able to speak in complete sentences without shortness of breath.  HENT:     Mouth/Throat:     Mouth: Mucous membranes are moist.     Pharynx: No oropharyngeal exudate or posterior oropharyngeal erythema.  Eyes:     Conjunctiva/sclera: Conjunctivae normal.     Pupils: Pupils are equal, round, and reactive to light.  Cardiovascular:     Rate and Rhythm: Normal rate and regular rhythm.     Heart sounds: No murmur heard. Pulmonary:     Effort: No respiratory distress.     Breath sounds: No stridor. No wheezing, rhonchi or rales.  Chest:     Chest wall: No tenderness.  Abdominal:     Palpations: Abdomen is soft.     Tenderness: There is no abdominal tenderness.  Musculoskeletal:     Cervical back: Neck supple.  Lymphadenopathy:     Cervical: No cervical adenopathy.  Skin:    Coloration: Skin is not jaundiced or pale.  Neurological:     General: No focal deficit present.     Mental Status: She is oriented to person, place, and time.  Psychiatric:        Behavior: Behavior normal.      UC Treatments / Results  Labs (all labs ordered are listed, but only abnormal results are displayed) Labs Reviewed - No data to display  EKG   Radiology DG Chest 2 View  Result Date: 01/25/2022 CLINICAL DATA:  26 year old female with pressure like gas sensation in chest for 2 days. Difficulty swallowing. EXAM: CHEST - 2 VIEW COMPARISON:  Chest radiographs 05/07/2018. FINDINGS: Negative visible bowel gas pattern in the upper abdomen. Mildly lower lung volumes. Normal cardiac size and mediastinal contours. Visualized tracheal air column is within normal  limits. Both lungs appear clear. No pneumothorax or  pleural effusion. No pneumoperitoneum. No osseous abnormality identified. IMPRESSION: Negative.  No cardiopulmonary abnormality. Electronically Signed   By: Genevie Ann M.D.   On: 01/25/2022 10:57    Procedures Procedures (including critical care time)  Medications Ordered in UC Medications - No data to display  Initial Impression / Assessment and Plan / UC Course  I have reviewed the triage vital signs and the nursing notes.  Pertinent labs & imaging results that were available during my care of the patient were reviewed by me and considered in my medical decision making (see chart for details).     Chest x-ray is negative for any problem.  I am going to add Carafate to her Prilosec.  She does have primary care and she will call them tomorrow for follow-up. Final Clinical Impressions(s) / UC Diagnoses   Final diagnoses:  Chest pressure     Discharge Instructions      Your chest x-ray was negative for any problem.  Continue your omeprazole 20 mg 1 daily  Also take sucralfate 1 g--1 tablet 2 times daily.  This is to coat your stomach and esophagus.  If you feel like the trouble is more in your esophagus, you can crush the tablet and tablespoon and add a little water and that weight will act as a slurry and coat as it goes down.     ED Prescriptions     Medication Sig Dispense Auth. Provider   sucralfate (CARAFATE) 1 g tablet Take 1 tablet (1 g total) by mouth 2 (two) times daily. 60 tablet Donnice Nielsen, Gwenlyn Perking, MD      PDMP not reviewed this encounter.   Barrett Henle, MD 01/25/22 [phone]

## 2022-01-25 NOTE — ED Triage Notes (Signed)
Pt reports trapped gas in chest since yesterday. Reports had this before in her stomach related to gastritis. Reports hard to eat or drink due to hard to swallow down.

## 2022-01-25 NOTE — Discharge Instructions (Addendum)
Please pick up the Carafate as prescribed by the urgent care provider yesterday.  This may provide some relief for your symptoms.  For nightly, your labs here today were very reassuring, but I do think that you need to see a gastroenterologist to better evaluate the foreign body sensation that you feel in your esophagus.  I given you a referral to them and you should receive a phone call within the next couple of days.  If you do not hear from them, please give them a call.  Return if you stop being able to tolerate food or liquids.

## 2022-01-25 NOTE — ED Provider Notes (Signed)
[facility] Provider [facility]   CSN: [id number] Arrival date & time: 01/25/22  2030     History  Chief Complaint  Patient presents with   Chest Pain    Elizabeth Shaffer is a 26 y.o. female who presents to the emergency department for evaluation of substernal chest discomfort.  Patient states it feels that there is air or gas trapped in her chest and is quite painful when she swallows.  She feels that she has to force whenever she is swallowing down in order for it to completely go down.  She was seen by urgent care yesterday and had a normal chest x-ray.  Patient currently takes Prilosec 20 mg once daily, and urgent care provider added and 1 g of sucralfate twice daily for better symptom relief.  Patient states she has not yet picked up this medication.  Her plan was to call her primary care doctor i tomorrow morning for follow-up, however her pain has not improved at all and she decided to come to the emergency department for evaluation.  She otherwise denies shortness of breath, fevers, abdominal pain, nausea, vomiting and diarrhea.   Chest Pain Associated symptoms: no abdominal pain, no fever, no nausea, no shortness of breath and no vomiting        Home Medications Prior to Admission medications   Medication Sig Start Date End Date Taking? Authorizing Provider  omeprazole (PRILOSEC) 20 MG capsule Take 1 capsule (20 mg total) by mouth daily. **PLEASE CONTACT THE OFFICE TO SCHEDULE A FOLLOW UP** 12/09/20   Shaffer, Elizabeth S, PA-C  sucralfate (CARAFATE) 1 g tablet Take 1 tablet (1 g total) by mouth 2 (two) times daily. 01/25/22   Elizabeth Resides, MD      Allergies    Shellfish allergy, Crab (diagnostic), and Ibuprofen    Review of Systems   Review of Systems  Constitutional:  Negative for fever.  Respiratory:  Negative for shortness of breath.   Cardiovascular:  Positive for chest pain.  Gastrointestinal:  Negative for abdominal pain, diarrhea,  nausea and vomiting.    Physical Exam Updated Vital Signs BP (!) 131/96   Pulse 88   Temp 98 F (36.7 C) (Oral)   Resp 16   LMP 01/13/2022 (Exact Date)   SpO2 98%  Physical Exam Vitals and nursing note reviewed.  Constitutional:      General: She is not in acute distress.    Appearance: She is not ill-appearing.  HENT:     Head: Atraumatic.  Eyes:     Conjunctiva/sclera: Conjunctivae normal.  Cardiovascular:     Rate and Rhythm: Normal rate and regular rhythm.     Pulses: Normal pulses.          Radial pulses are 2+ on the right side and 2+ on the left side.       Dorsalis pedis pulses are 2+ on the right side and 2+ on the left side.     Heart sounds: No murmur heard. Pulmonary:     Effort: Pulmonary effort is normal. No respiratory distress.     Breath sounds: Normal breath sounds. No wheezing.  Chest:     Chest wall: No deformity, tenderness or crepitus.  Abdominal:     General: Abdomen is flat. There is no distension.     Palpations: Abdomen is soft.     Tenderness: There is no abdominal tenderness.  Musculoskeletal:        General: Normal range of motion.  Cervical back: Normal range of motion.  Skin:    General: Skin is warm and dry.     Capillary Refill: Capillary refill takes less than 2 seconds.  Neurological:     General: No focal deficit present.     Mental Status: She is alert.  Psychiatric:        Mood and Affect: Mood normal.     ED Results / Procedures / Treatments   Labs (all labs ordered are listed, but only abnormal results are displayed) Labs Reviewed  COMPREHENSIVE METABOLIC PANEL  CBC WITH DIFFERENTIAL/PLATELET  LIPASE, BLOOD  TROPONIN I (HIGH SENSITIVITY)    EKG None  Radiology DG Chest 2 View  Result Date: 01/25/2022 CLINICAL DATA:  Midsternal chest pain. EXAM: CHEST - 2 VIEW COMPARISON:  January 25, 2022 (10:50 a.m.) FINDINGS: The heart size and mediastinal contours are within normal limits. Both lungs are clear. The  visualized skeletal structures are unremarkable. IMPRESSION: No active cardiopulmonary disease. Electronically Signed   By: Elizabeth Shaffer M.D.   On: 01/25/2022 21:14   DG Chest 2 View  Result Date: 01/25/2022 CLINICAL DATA:  26 year old female with pressure like gas sensation in chest for 2 days. Difficulty swallowing. EXAM: CHEST - 2 VIEW COMPARISON:  Chest radiographs 05/07/2018. FINDINGS: Negative visible bowel gas pattern in the upper abdomen. Mildly lower lung volumes. Normal cardiac size and mediastinal contours. Visualized tracheal air column is within normal limits. Both lungs appear clear. No pneumothorax or pleural effusion. No pneumoperitoneum. No osseous abnormality identified. IMPRESSION: Negative.  No cardiopulmonary abnormality. Electronically Signed   By: Elizabeth Shaffer M.D.   On: 01/25/2022 10:57    Procedures Procedures    Medications Ordered in ED Medications  sucralfate (CARAFATE) tablet 1 g (has no administration in time range)  lactated ringers bolus 1,000 mL (1,000 mLs Intravenous New Bag/Given 01/25/22 2221)  pantoprazole (PROTONIX) injection 40 mg (40 mg Intravenous Given 01/25/22 2221)  alum & mag hydroxide-simeth (MAALOX/MYLANTA) 200-200-20 MG/5ML suspension 30 mL (30 mLs Oral Given 01/25/22 2221)    ED Course/ Medical Decision Making/ A&P                           Medical Decision Making Amount and/or Complexity of Data Reviewed Labs: ordered. Radiology: ordered.  Risk OTC drugs. Prescription drug management.   Social determinants of health:  Social History   Socioeconomic History   Marital status: Single    Spouse name: Not on file   Number of children: 1   Years of education: Not on file   Highest education level: 12th grade  Occupational History   Occupation: Food service    Comment: [name]  Tobacco Use   Smoking status: Never    Passive exposure: Never   Smokeless tobacco: Never  Vaping Use   Vaping Use: Never used  Substance and Sexual  Activity   Alcohol use: Not Currently    Comment: social   Drug use: Yes    Frequency: 7.0 times per week    Types: Marijuana    Comment: plans to quit   Sexual activity: Yes  Other Topics Concern   Not on file  Social History Narrative   Not on file   Social Determinants of Health   Financial Resource Strain: Not on file  Food Insecurity: No Food Insecurity (01/27/2021)   Hunger Vital Sign    Worried About Running Out of Food in the Last Year: Never true  Ran Out of Food in the Last Year: Never true  Transportation Needs: No Transportation Needs (01/27/2021)   PRAPARE - Hydrologist (Medical): No    Lack of Transportation (Non-Medical): No  Physical Activity: Not on file  Stress: Not on file  Social Connections: Not on file  Intimate Partner Violence: Not At Risk (07/19/2018)   Humiliation, Afraid, Rape, and Kick questionnaire    Fear of Current or Ex-Partner: No    Emotionally Abused: No    Physically Abused: No    Sexually Abused: No     Initial impression:  This patient presents to the ED for concern of substernal chest pain, this involves an extensive number of treatment options, and is a complaint that carries with it a high risk of complications and morbidity.   Differentials include esophageal rupture.  I do not believe the patient has an emergent cause of chest pain, other urgent/non-acute considerations include, but are not limited to:  pneumonia, pleuritis, bronchitis, pneumothorax, tumor, gastroesophageal reflux disease (GERD), esophageal spasm, Mallory-Weiss syndrome, peptic ulcer disease, biliary disease, pancreatitis, functional gastrointestinal pain costochondritis  Comorbidities affecting care:  GERD, hypertension  Additional history obtained: Urgent care records  Lab Tests  I Ordered, reviewed, and interpreted labs and EKG.  The pertinent results include:  CMP, CBC, lipase and urinalysis normal Troponin normal  Imaging  Studies ordered:  I ordered imaging studies including  Chest x-ray without acute findings I independently visualized and interpreted imaging and I agree with the radiologist interpretation.   EKG: Normal sinus rhythm    Medicines ordered and prescription drug management:  I ordered medication including: 1 L LR bolus Maalox Protonix 40 mg IV Carafate 1 g Reevaluation of the patient after these medicines showed that the patient stayed the same I have reviewed the patients home medicines and have made adjustments as needed   ED Course/Re-evaluation: Overall is well-appearing and in no acute distress.  Vitals without significant abnormality.  On exam, lung sounds and heart sounds normal.  No reproducible chest wall tenderness to palpation.  I ordered labs which were overall reassuring.  Single troponin was less than 2 and given description and duration of symptoms, I canceled the second troponin.  She is given GI cocktail along with Protonix fluids and Carafate, although this did not seem to help her symptoms very much.  Ultimately, I do not think that there is an emergent cause of her symptoms, and she requires GI follow-up to better evaluate for the cause of her pain.  This is likely related to discomfort secondary to acid reflux or possible gas.  Ambulatory referral to gastroenterology was placed.  Patient is understanding that if they do not call her within 72 hours, she is to call their office to set up appointment.  Disposition:  After consideration of the diagnostic results, physical exam, history and the patients response to treatment feel that the patent would benefit from discharge with strict return precautions.   Substernal chest pain: Plan and management as described above. Discharged home in good condition.  Final Clinical Impression(s) / ED Diagnoses Final diagnoses:  Substernal chest pain    Rx / DC Orders ED Discharge Orders          Ordered    Ambulatory referral  to Gastroenterology        01/25/22 2318              Rodena Piety 01/25/22 2330    Tretha Sciara,  MD 01/30/22 [id number]

## 2022-01-25 NOTE — ED Triage Notes (Signed)
Patient said she feels like she has trapped gas or feels like something is stuck in her chest. She said she went to urgent care and they did an x ray which was negative. Pain has been there since yesterday. She said the pain hurts her heart and it is giving her a headache.

## 2022-01-25 NOTE — Discharge Instructions (Addendum)
Your chest x-ray was negative for any problem.  Continue your omeprazole 20 mg 1 daily  Also take sucralfate 1 g--1 tablet 2 times daily.  This is to coat your stomach and esophagus.  If you feel like the trouble is more in your esophagus, you can crush the tablet and tablespoon and add a little water and that weight will act as a slurry and coat as it goes down.

## 2022-01-25 NOTE — ED Provider Triage Note (Signed)
Emergency Medicine Provider Triage Evaluation Note  Elizabeth Shaffer , a 26 y.o. female  was evaluated in triage.  Pt complains of midsternal chest pain that started yesterday.  Patient was seen in urgent care for complaints and it was noted to be secondary to her acid reflux/gas.  Carafate was added to her regimen of Prilosec and was instructed to follow-up with PCP.  Patient states pain is worse and is not responding to the medications.  She states that it feels as if something is wrong with her esophagus or her heart, making it difficult for her to swallow.  She denies vomiting although has some nausea.  Denies shortness of breath, fevers.  Review of Systems  Positive: Chest pain, nausea Negative: Shortness of breath, cough, vomiting, diarrhea  Physical Exam  BP (!) 131/96   Pulse 88   Temp 98 F (36.7 C) (Oral)   Resp 16   LMP 01/13/2022 (Exact Date)   SpO2 98%  Gen:   Awake, no distress   Resp:  Normal effort, speaking in full and complete sentences MSK:   Moves extremities without difficulty  Other:    Medical Decision Making  Medically screening exam initiated at 8:47 PM.  Appropriate orders placed.  Elizabeth Shaffer was informed that the remainder of the evaluation will be completed by another provider, this initial triage assessment does not replace that evaluation, and the importance of remaining in the ED until their evaluation is complete.     Elizabeth Shaffer, [place] 01/25/22 2050

## 2022-07-29 ENCOUNTER — Encounter: Payer: Self-pay | Admitting: Internal Medicine

## 2022-07-29 ENCOUNTER — Ambulatory Visit (INDEPENDENT_AMBULATORY_CARE_PROVIDER_SITE_OTHER): Payer: Medicaid Other | Admitting: Internal Medicine

## 2022-07-29 DIAGNOSIS — K5904 Chronic idiopathic constipation: Secondary | ICD-10-CM | POA: Diagnosis not present

## 2022-07-29 DIAGNOSIS — K219 Gastro-esophageal reflux disease without esophagitis: Secondary | ICD-10-CM | POA: Diagnosis not present

## 2022-07-29 DIAGNOSIS — R1013 Epigastric pain: Secondary | ICD-10-CM

## 2022-07-29 DIAGNOSIS — R140 Abdominal distension (gaseous): Secondary | ICD-10-CM

## 2022-07-29 MED ORDER — OMEPRAZOLE 40 MG PO CPDR: 40.0000 mg | DELAYED_RELEASE_CAPSULE | Freq: Every day | ORAL | 5 refills | Status: DC

## 2022-07-29 NOTE — Patient Instructions (Addendum)
_______________________________________________________  If your blood pressure at your visit was 140/90 or greater, please contact your primary care physician to follow up on this.  If you are age 26 or younger, your body mass index should be between 19-25. Your Body mass index is 21.73 kg/m. If this is out of the aformentioned range listed, please consider follow up with your Primary Care Provider.  ________________________________________________________  The [place] [facility] providers would like to encourage you to use [facility] to communicate with providers for non-urgent requests or questions.  Due to long hold times on the telephone, sending your provider a message by [facility] may be a faster and more efficient way to get a response.  Please allow 48 business hours for a response.  Please remember that this is for non-urgent requests.  _______________________________________________________  Dennis Bast have been scheduled for an endoscopy. Please follow written instructions given to you at your visit today. If you use inhalers (even only as needed), please bring them with you on the day of your procedure.  We have sent the following medications to your pharmacy for you to pick up at your convenience: CONTINUE: Omeprazole 13m one capsule daily  Please purchase the following medications over the counter and take as directed:  START: Miralax 17grams daily  Due to recent changes in healthcare laws, you may see the results of your imaging and laboratory studies on MyChart before your provider has had a chance to review them.  We understand that in some cases there may be results that are confusing or concerning to you. Not all laboratory results come back in the same time frame and the provider may be waiting for multiple results in order to interpret others.  Please give [id number] hours in order for your provider to thoroughly review all the results before contacting the office for clarification of your  results.   Thank you for entrusting me with your care and choosing [facility]  Dr PHilarie Fredrickson

## 2022-07-29 NOTE — Progress Notes (Signed)
Patient ID: Elizabeth Shaffer, female   DOB: July 13, 1995, 27 y.o.   MRN: [id number] HPI: Elizabeth Shaffer is a 27 year old female with past medical history of "GERD and gastritis", hypertension, migraine headaches who is seen in consult at the request of Gwinda Passe, PA-C to evaluate epigastric pain, heartburn and bloating as well as infrequent bowel movements.  She is here alone today.  She was seen here over 3 years ago in November 2020 for time what was felt to be controlled GERD but migraine headaches causing nausea and vomiting.  She reports that she has been faithful with taking omeprazole 40 mg daily but has burning epigastric discomfort and pyrosis on a near daily basis.  This leads to nausea and decreased appetite.  Separate from this she has episodes of severe abdominal bloating which seems to be worse when she cannot have a bowel movement.  At times she will have lower abdominal pain which can be intense.  She does feel constipation and has a bowel movement which are hard and pellet-like maybe 2 days/week.  Bloating is worse when she cannot have normal bowel movements.  She lives here in [place].  She is adopted. She denies tobacco and regular alcohol use.   Past Medical History:  Diagnosis Date   Anemia    during pregnancy   Displaced fracture of fifth metacarpal bone of left hand    Gastritis    GERD (gastroesophageal reflux disease)    Headache    HTN (hypertension)    Migraines    PONV (postoperative nausea and vomiting)     Past Surgical History:  Procedure Laterality Date   hand     carpule tunnel   MOUTH SURGERY  2011   MULTIPLE TOOTH EXTRACTIONS     OPEN REDUCTION INTERNAL FIXATION (ORIF) METACARPAL Left 10/31/2019   Procedure: OPEN REDUCTION INTERNAL FIXATION (ORIF) LEFT FIFTH METACARPAL FRACTURE USING EXSOMED NAIL 4.40m x 462m  Surgeon: GrRoseanne KaufmanMD;  [facility];  Laterality: Left;  60 mins   WISDOM TOOTH EXTRACTION      Outpatient  Medications Prior to Visit  Medication Sig Dispense Refill   omeprazole (PRILOSEC) 40 MG capsule Take 40 mg by mouth daily.     sucralfate (CARAFATE) 1 g tablet Take 1 tablet (1 g total) by mouth 2 (two) times daily. (Patient not taking: Reported on 07/29/2022) 60 tablet 1   omeprazole (PRILOSEC) 20 MG capsule Take 1 capsule (20 mg total) by mouth daily. **PLEASE CONTACT THE OFFICE TO SCHEDULE A FOLLOW UP** (Patient not taking: Reported on 07/29/2022) 30 capsule 0   No facility-administered medications prior to visit.    Allergies  Allergen Reactions   Shellfish Allergy Nausea And Vomiting   Crab (Diagnostic) Nausea And Vomiting   Ibuprofen     Gastritis     Family History  Adopted: Yes  Problem Relation Age of Onset   Hypertension Mother     Social History   Tobacco Use   Smoking status: Never    Passive exposure: Never   Smokeless tobacco: Never  Vaping Use   Vaping Use: Never used  Substance Use Topics   Alcohol use: Not Currently    Comment: social   Drug use: Yes    Frequency: 7.0 times per week    Types: Marijuana    Comment: plans to quit    ROS: As per history of present illness, otherwise negative  BP 106/68   Ht 5' 1"$  (1.549 m)   Wt  115 lb (52.2 kg)   BMI 21.73 kg/m  Gen: awake, alert, NAD HEENT: anicteric  CV: RRR, no mrg Pulm: CTA b/l Abd: soft, mild epigastric tenderness without rebound or guarding, ND, +BS throughout Ext: no c/c/e Neuro: nonfocal   RELEVANT LABS AND IMAGING: CBC    Component Value Date/Time   WBC 8.8 01/25/2022 2208   RBC 4.23 01/25/2022 2208   HGB 13.8 01/25/2022 2208   HGB 10.9 (L) 11/01/2018 0937   HCT 40.1 01/25/2022 2208   HCT 30.6 (L) 11/01/2018 0937   PLT 210 01/25/2022 2208   PLT 168 11/01/2018 0937   MCV 94.8 01/25/2022 2208   MCV 92 11/01/2018 0937   MCH 32.6 01/25/2022 2208   MCHC 34.4 01/25/2022 2208   RDW 11.8 01/25/2022 2208   RDW 11.9 11/01/2018 0937   LYMPHSABS 3.3 01/25/2022 2208   LYMPHSABS 2.0  07/19/2018 1030   MONOABS 0.6 01/25/2022 2208   EOSABS 0.1 01/25/2022 2208   EOSABS 0.0 07/19/2018 1030   BASOSABS 0.1 01/25/2022 2208   BASOSABS 0.0 07/19/2018 1030    CMP     Component Value Date/Time   NA 139 01/25/2022 2208   K 4.1 01/25/2022 2208   CL 107 01/25/2022 2208   CO2 27 01/25/2022 2208   GLUCOSE 91 01/25/2022 2208   BUN 12 01/25/2022 2208   CREATININE 0.75 01/25/2022 2208   CALCIUM 9.4 01/25/2022 2208   PROT 8.0 01/25/2022 2208   ALBUMIN 4.2 01/25/2022 2208   AST 18 01/25/2022 2208   ALT 21 01/25/2022 2208   ALKPHOS 64 01/25/2022 2208   BILITOT 0.5 01/25/2022 2208   GFRNONAA >60 01/25/2022 2208   GFRAA >60 02/04/2020 2041   ABDOMEN ULTRASOUND COMPLETE   COMPARISON:  None.   FINDINGS: Gallbladder: No gallstones or wall thickening visualized. No sonographic Murphy sign noted by sonographer.   Common bile duct: Diameter: 3 mm, within normal limits.   Liver: No focal lesion identified. Within normal limits in parenchymal echogenicity. Portal vein is patent on color Doppler imaging with normal direction of blood flow towards the liver.   IVC: No abnormality visualized.   Pancreas: Visualized portion unremarkable.   Spleen: Size and appearance within normal limits.   Right Kidney: Length: 9.5 cm. Echogenicity within normal limits. No mass or hydronephrosis visualized.   Left Kidney: Length: 9.5 cm. Echogenicity within normal limits. No mass or hydronephrosis visualized.   Abdominal aorta: No aneurysm visualized.   Other findings: None.   IMPRESSION: Normal abdomen ultrasound.     Electronically Signed   By: Marlaine Hind M.D.   On: 04/27/2019 08:22  ASSESSMENT/PLAN: 27 year old female with past medical history of "GERD and gastritis", hypertension, migraine headaches who is seen in consult at the request of Gwinda Passe, PA-C to evaluate epigastric pain, heartburn and bloating as well as infrequent bowel movements.   Heartburn and  epigastric pain --symptoms persist despite daily omeprazole 40 mg for greater than 8 weeks --Upper endoscopy in the [place] recommended; we reviewed the risk, benefits and alternatives and she is agreeable and wishes to proceed -- Continue daily omeprazole 40 mg  2.  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  I recommended we work to improve bowel habits before considering any further workup.  Previous abdominal ultrasound unremarkable -- MiraLAX 17 g daily; I asked that she use this for at least 2 weeks before determining if it is effective.  If ineffective for regular normal bowel movements  she should let me know and we could try additional medication such as Trulance or Linzess.       [id number], [address] [place],  [place] [address]

## 2022-07-30 ENCOUNTER — Encounter: Payer: Self-pay | Admitting: Internal Medicine

## 2022-07-30 ENCOUNTER — Ambulatory Visit (AMBULATORY_SURGERY_CENTER): Payer: Medicaid Other | Admitting: Internal Medicine

## 2022-07-30 VITALS — BP 119/89 | HR 88 | Temp 98.6°F | Resp 19 | Ht 61.0 in | Wt 115.0 lb

## 2022-07-30 DIAGNOSIS — K229 Disease of esophagus, unspecified: Secondary | ICD-10-CM

## 2022-07-30 DIAGNOSIS — K219 Gastro-esophageal reflux disease without esophagitis: Secondary | ICD-10-CM | POA: Diagnosis not present

## 2022-07-30 DIAGNOSIS — R1013 Epigastric pain: Secondary | ICD-10-CM

## 2022-07-30 DIAGNOSIS — K319 Disease of stomach and duodenum, unspecified: Secondary | ICD-10-CM | POA: Diagnosis not present

## 2022-07-30 MED ORDER — SODIUM CHLORIDE 0.9 % IV SOLN: 500.0000 mL | INTRAVENOUS | Status: DC

## 2022-07-30 NOTE — Progress Notes (Signed)
See office note from yesterday for current H&P  Patient presenting for EGD to evaluate heartburn epigastric pain  She remains appropriate for EGD here today.

## 2022-07-30 NOTE — Progress Notes (Signed)
Pt's states no medical or surgical changes since previsit or office visit. 

## 2022-07-30 NOTE — Progress Notes (Signed)
Pt resting comfortably. VSS. Airway intact. SBAR complete to RN. All questions answered.   

## 2022-07-30 NOTE — Op Note (Signed)
[facility] Patient Name: Elizabeth Shaffer Procedure Date: 07/30/2022 2:35 PM MRN: [id number] Endoscopist: Jerene Bears , MD, [id number] Age: 27 Referring MD:  Date of Birth: 11-25-1995 Gender: Female Account #: [id number] Procedure:                Upper GI endoscopy Indications:              Epigastric abdominal pain, Heartburn despite                            omeprazole 40 mg daily Medicines:                Monitored Anesthesia Care Procedure:                Pre-Anesthesia Assessment:                           - Prior to the procedure, a History and Physical                            was performed, and patient medications and                            allergies were reviewed. The patient's tolerance of                            previous anesthesia was also reviewed. The risks                            and benefits of the procedure and the sedation                            options and risks were discussed with the patient.                            All questions were answered, and informed consent                            was obtained. Prior Anticoagulants: The patient has                            taken no anticoagulant or antiplatelet agents. ASA                            Grade Assessment: I - A normal, healthy patient.                            After reviewing the risks and benefits, the patient                            was deemed in satisfactory condition to undergo the                            procedure.  After obtaining informed consent, the endoscope was                            passed under direct vision. Throughout the                            procedure, the patient's blood pressure, pulse, and                            oxygen saturations were monitored continuously. The                            Olympus Scope [id number] was introduced through the                            mouth, and advanced to the second part of  duodenum.                            The upper GI endoscopy was accomplished without                            difficulty. The patient tolerated the procedure                            well. Scope In: Scope Out: Findings:                 The examined esophagus was normal. Biopsies were                            taken from the distal esophagus with a cold forceps                            for histology and to exclude uncontrolled reflux                            inflammation.                           Patchy mildly erythematous mucosa without bleeding                            was found in the prepyloric region of the stomach.                            Biopsies were taken with a cold forceps for                            histology and Helicobacter pylori testing.                           The examined duodenum was normal. Complications:            No immediate complications. Estimated Blood Loss:     Estimated blood loss was minimal. Impression:               -  Normal esophagus. Biopsied in the distal third to                            evaluate for uncontrolled acid reflux on once daily                            omeprazole 40 mg.                           - Erythematous mucosa in the prepyloric region of                            the stomach. Biopsied to exclude H. Pylori in the                            setting of pain.                           - Normal examined duodenum. Recommendation:           - Patient has a contact number available for                            emergencies. The signs and symptoms of potential                            delayed complications were discussed with the                            patient. Return to normal activities tomorrow.                            Written discharge instructions were provided to the                            patient.                           - Resume previous diet.                           - Continue present  medications. For now continue                            omeprazole 40 mg daily. Depending on biopsy results                            may consider BID PPI versus change in PPI to see if                            more effective.                           - Await pathology results. Jerene Bears, MD 07/30/2022 3:03:53 PM This report has been signed electronically.

## 2022-07-30 NOTE — Progress Notes (Signed)
Called to room to assist during endoscopic procedure.  Patient ID and intended procedure confirmed with present staff. Received instructions for my participation in the procedure from the performing physician.  

## 2022-07-30 NOTE — Patient Instructions (Signed)
Resume previous diet. Continue present medications. For now continue omeprazole 40 mg daily.  Await biopsy results; possible implementation of proton pump inhibitor.   YOU HAD AN ENDOSCOPIC PROCEDURE TODAY AT [facility]:   Refer to the procedure report that was given to you for any specific questions about what was found during the examination.  If the procedure report does not answer your questions, please call your gastroenterologist to clarify.  If you requested that your care partner not be given the details of your procedure findings, then the procedure report has been included in a sealed envelope for you to review at your convenience later.  YOU SHOULD EXPECT: Some feelings of bloating in the abdomen. Passage of more gas than usual.  Walking can help get rid of the air that was put into your GI tract during the procedure and reduce the bloating. If you had a lower endoscopy (such as a colonoscopy or flexible sigmoidoscopy) you may notice spotting of blood in your stool or on the toilet paper. If you underwent a bowel prep for your procedure, you may not have a normal bowel movement for a few days.  Please Note:  You might notice some irritation and congestion in your nose or some drainage.  This is from the oxygen used during your procedure.  There is no need for concern and it should clear up in a day or so.  SYMPTOMS TO REPORT IMMEDIATELY:   Following upper endoscopy (EGD)  Vomiting of blood or coffee ground material  New chest pain or pain under the shoulder blades  Painful or persistently difficult swallowing  New shortness of breath  Fever of 100F or higher  Black, tarry-looking stools  For urgent or emergent issues, a gastroenterologist can be reached at any hour by calling [phone]. Do not use MyChart messaging for urgent concerns.    DIET:  We do recommend a small meal at first, but then you may proceed to your regular diet.  Drink plenty of fluids  but you should avoid alcoholic beverages for 24 hours.  ACTIVITY:  You should plan to take it easy for the rest of today and you should NOT DRIVE or use heavy machinery until tomorrow (because of the sedation medicines used during the test).    FOLLOW UP: Our staff will call the number listed on your records the next business day following your procedure.  We will call around 7:15- 8:00 am to check on you and address any questions or concerns that you may have regarding the information given to you following your procedure. If we do not reach you, we will leave a message.     If any biopsies were taken you will be contacted by phone or by letter within the next 1-3 weeks.  Please call us at [phone] if you have not heard about the biopsies in 3 weeks.    SIGNATURES/CONFIDENTIALITY: You and/or your care partner have signed paperwork which will be entered into your electronic medical record.  These signatures attest to the fact that that the information above on your After Visit Summary has been reviewed and is understood.  Full responsibility of the confidentiality of this discharge information lies with you and/or your care-partner.

## 2022-07-31 ENCOUNTER — Telehealth: Payer: Self-pay

## 2022-07-31 NOTE — Telephone Encounter (Signed)
Left message on follow up call. 

## 2022-08-13 ENCOUNTER — Other Ambulatory Visit: Payer: Self-pay

## 2022-08-13 MED ORDER — OMEPRAZOLE 40 MG PO CPDR: 40.0000 mg | DELAYED_RELEASE_CAPSULE | Freq: Two times a day (BID) | ORAL | 3 refills | Status: DC

## 2022-08-19 ENCOUNTER — Encounter (HOSPITAL_COMMUNITY): Payer: Self-pay | Admitting: *Deleted

## 2022-08-19 ENCOUNTER — Emergency Department (HOSPITAL_COMMUNITY)
Admission: EM | Admit: 2022-08-19 | Discharge: 2022-08-19 | Disposition: A | Discharge status: Home / Self Care | Payer: Medicaid Other | Attending: Emergency Medicine | Admitting: Emergency Medicine

## 2022-08-19 ENCOUNTER — Other Ambulatory Visit: Payer: Self-pay

## 2022-08-19 DIAGNOSIS — R110 Nausea: Secondary | ICD-10-CM | POA: Diagnosis not present

## 2022-08-19 DIAGNOSIS — Z3201 Encounter for pregnancy test, result positive: Secondary | ICD-10-CM | POA: Diagnosis present

## 2022-08-19 DIAGNOSIS — M545 Low back pain, unspecified: Secondary | ICD-10-CM | POA: Diagnosis not present

## 2022-08-19 NOTE — ED Provider Notes (Signed)
[facility] Provider Note   CSN: [id number] Arrival date & time: 08/19/22  1519     History  No chief complaint on file.   Elizabeth Shaffer is a 27 y.o. female.  Who presents to the ED for evaluation of positive pregnancy test at home.  Last menstrual cycle was February 1.  She took a urine pregnancy test today and found that it was positive.  Would like a blood test to confirm this.  Denies vaginal bleeding.  Reports mild low back pain, nausea and breast soreness.  States she will follow-up with the women [facility] for [facility] care.  HPI     Home Medications Prior to Admission medications   Medication Sig Start Date End Date Taking? Authorizing Provider  omeprazole (PRILOSEC) 40 MG capsule Take 1 capsule (40 mg total) by mouth 2 (two) times daily. 08/13/22   Pyrtle, Lajuan Lines, MD  omeprazole (PRILOSEC) 40 MG capsule Take 1 capsule (40 mg total) by mouth 2 (two) times daily with breakfast and lunch. 08/13/22   Pyrtle, Lajuan Lines, MD  sucralfate (CARAFATE) 1 g tablet Take 1 tablet (1 g total) by mouth 2 (two) times daily. Patient not taking: Reported on 07/29/2022 01/25/22   Barrett Henle, MD      Allergies    Shellfish allergy, Crab (diagnostic), and Ibuprofen    Review of Systems   Review of Systems  All other systems reviewed and are negative.   Physical Exam Updated Vital Signs BP 107/72   Pulse 70   Temp 99.4 F (37.4 C)   Resp 16   Ht '5\' 1"'$  (1.549 m)   Wt 52.2 kg   LMP 07/20/2022   SpO2 100%   BMI 21.74 kg/m  Physical Exam Vitals and nursing note reviewed.  Constitutional:      General: She is not in acute distress.    Appearance: Normal appearance. She is normal weight. She is not ill-appearing.  HENT:     Head: Normocephalic and atraumatic.  Pulmonary:     Effort: Pulmonary effort is normal. No respiratory distress.  Abdominal:     General: Abdomen is flat.  Musculoskeletal:        General: Normal range of motion.      Cervical back: Neck supple.  Skin:    General: Skin is warm and dry.  Neurological:     Mental Status: She is alert and oriented to person, place, and time.  Psychiatric:        Mood and Affect: Mood normal.        Behavior: Behavior normal.     ED Results / Procedures / Treatments   Labs (all labs ordered are listed, but only abnormal results are displayed) Labs Reviewed  I-STAT BETA HCG BLOOD, ED ([facility], [facility], [facility] ONLY)    EKG None  Radiology No results found.  Procedures Procedures    Medications Ordered in ED Medications - No data to display  ED Course/ Medical Decision Making/ A&P                             Medical Decision Making This patient presents to the ED for concern of positive pregnancy test at home, this involves an extensive number of treatment options, and is a complaint that carries with it a high risk of complications and morbidity.   My initial workup includes i-STAT hCG  Additional history obtained from: Nursing notes  from this visit.  Afebrile, hemodynamically stable.  27 year old female presenting to the ED for evaluation of positive pregnancy test at home.  Last menstrual cycle was February 1.  Urine pregnancy test positive today.  Requesting blood pregnancy test.  I-STAT hCG was ordered.  Denies abdominal pain or vaginal bleeding.  Reports nausea and is requesting medication for this.  Patient was educated on over-the-counter therapies including ginger supplements, Unisom, B6.  She was encouraged to follow-up with the women [facility] for [facility] care and to call for her first OB appointment.  Patient states she will check her MyChart for her hCG results.  She was given return precautions.  Stable at discharge.  At this time there does not appear to be any evidence of an acute emergency medical condition and the patient appears stable for discharge with appropriate outpatient follow up. Diagnosis was discussed with patient who verbalizes understanding of care  plan and is agreeable to discharge. I have discussed return precautions with patient who verbalizes understanding. Patient encouraged to follow-up with their PCP within 1 week. All questions answered.  Note: Portions of this report may have been transcribed using voice recognition software. Every effort was made to ensure accuracy; however, inadvertent computerized transcription errors may still be present.        Final Clinical Impression(s) / ED Diagnoses Final diagnoses:  Positive urine pregnancy test    Rx / DC Orders ED Discharge Orders     None         Roylene Reason, Hershal Coria 08/19/22 1639    Fransico Meadow, MD 08/21/22 1807

## 2022-08-19 NOTE — Discharge Instructions (Signed)
You have been seen today for your complaint of positive pregnancy test at home. Your lab work will be available in Doctor, hospital. Your discharge medications include Unisom and vitamin B6.  These are over-the-counter medications used to help with nausea in pregnancy in the first trimester.  You may also try ginger candies or frozen ginger root Follow up with: An OB provider of your choice Please seek immediate medical care if you develop any of the following symptoms: You have persistent and uncontrolled nausea and vomiting. You faint. You have severe pain in your abdomen. At this time there does not appear to be the presence of an emergent medical condition, however there is always the potential for conditions to change. Please read and follow the below instructions.  Do not take your medicine if  develop an itchy rash, swelling in your mouth or lips, or difficulty breathing; call 911 and seek immediate emergency medical attention if this occurs.  You may review your lab tests and imaging results in their entirety on your MyChart account.  Please discuss all results of fully with your primary care provider and other specialist at your follow-up visit.  Note: Portions of this text may have been transcribed using voice recognition software. Every effort was made to ensure accuracy; however, inadvertent computerized transcription errors may still be present.

## 2022-08-19 NOTE — ED Triage Notes (Signed)
The pt is here to confirm that she is pregnant lmp feb 1st  she reports that she took a home preg test and it was pos  nusea  breast soreness

## 2022-08-29 ENCOUNTER — Encounter (HOSPITAL_COMMUNITY): Payer: Self-pay | Admitting: *Deleted

## 2022-08-29 ENCOUNTER — Inpatient Hospital Stay (HOSPITAL_COMMUNITY): Payer: Medicaid Other

## 2022-08-29 ENCOUNTER — Inpatient Hospital Stay (HOSPITAL_COMMUNITY)
Admission: AD | Admit: 2022-08-29 | Discharge: 2022-08-29 | Disposition: A | Discharge status: Home / Self Care | Payer: Medicaid Other | Attending: Obstetrics and Gynecology | Admitting: Obstetrics and Gynecology

## 2022-08-29 DIAGNOSIS — O99611 Diseases of the digestive system complicating pregnancy, first trimester: Secondary | ICD-10-CM | POA: Insufficient documentation

## 2022-08-29 DIAGNOSIS — O468X1 Other antepartum hemorrhage, first trimester: Secondary | ICD-10-CM

## 2022-08-29 DIAGNOSIS — Z3A01 Less than 8 weeks gestation of pregnancy: Secondary | ICD-10-CM

## 2022-08-29 DIAGNOSIS — O418X10 Other specified disorders of amniotic fluid and membranes, first trimester, not applicable or unspecified: Secondary | ICD-10-CM

## 2022-08-29 DIAGNOSIS — O208 Other hemorrhage in early pregnancy: Secondary | ICD-10-CM | POA: Insufficient documentation

## 2022-08-29 HISTORY — DX: Urinary tract infection, site not specified: N39.0

## 2022-08-29 LAB — CBC
HCT: 37 % (ref 36.0–46.0)
Hemoglobin: 13.2 g/dL (ref 12.0–15.0)
MCH: 33.5 pg (ref 26.0–34.0)
MCHC: 35.7 g/dL (ref 30.0–36.0)
MCV: 93.9 fL (ref 80.0–100.0)
Platelets: 182 10*3/uL (ref 150–400)
RBC: 3.94 MIL/uL (ref 3.87–5.11)
RDW: 11.6 % (ref 11.5–15.5)
WBC: 8.2 10*3/uL (ref 4.0–10.5)
nRBC: 0 % (ref 0.0–0.2)

## 2022-08-29 LAB — URINALYSIS, ROUTINE W REFLEX MICROSCOPIC
Bilirubin Urine: NEGATIVE
Glucose, UA: NEGATIVE mg/dL
Hgb urine dipstick: NEGATIVE
Ketones, ur: NEGATIVE mg/dL
Nitrite: NEGATIVE
Protein, ur: NEGATIVE mg/dL
Specific Gravity, Urine: 1.021 (ref 1.005–1.030)
pH: 8 (ref 5.0–8.0)

## 2022-08-29 LAB — WET PREP, GENITAL
Sperm: NONE SEEN
Trich, Wet Prep: NONE SEEN
WBC, Wet Prep HPF POC: <10 (ref <10)
Yeast Wet Prep HPF POC: NONE SEEN

## 2022-08-29 LAB — HCG, QUANTITATIVE, PREGNANCY: hCG, Beta Chain, Quant, S: 107790 m[IU]/mL — ABNORMAL HIGH (ref <5)

## 2022-08-29 LAB — POCT PREGNANCY, URINE: Preg Test, Ur: POSITIVE — AB

## 2022-08-29 NOTE — MAU Provider Note (Signed)
History     [id number]  Arrival date and time: 08/29/22 1115    Chief Complaint  Patient presents with   Abdominal Pain   Vaginal Bleeding     HPI Elizabeth Shaffer is a 27 y.o. at [redacted]w[redacted]d by LMP who presents for abdominal cramping & vaginal bleeding. Symptoms started over a week ago. Reports intermittent lower abdominal cramping that is bilateral. Nothing makes better or worse. This morning she notice pink spotting on toilet paper. Bleeding has not continued. Denies fever, n/v/d, constipation, dysuria, vaginal discharge, or recent intercourse.    OB History     Gravida  4   Para  1   Term  1   Preterm  0   AB  2   Living  1      SAB  1   IAB  1   Ectopic  0   Multiple  0   Live Births  1           Past Medical History:  Diagnosis Date   Anemia    during pregnancy   Displaced fracture of fifth metacarpal bone of left hand    Gastritis    GERD (gastroesophageal reflux disease)    Headache    HTN (hypertension)    with preg   Migraines    PONV (postoperative nausea and vomiting)    UTI (urinary tract infection)     Past Surgical History:  Procedure Laterality Date   hand     carpule tunnel   MOUTH SURGERY  2011   MULTIPLE TOOTH EXTRACTIONS     OPEN REDUCTION INTERNAL FIXATION (ORIF) METACARPAL Left 10/31/2019   Procedure: OPEN REDUCTION INTERNAL FIXATION (ORIF) LEFT FIFTH METACARPAL FRACTURE USING EXSOMED NAIL 4.35mm x 33mm;  Surgeon: Roseanne Kaufman, MD;  Location: [facility];  Service: Orthopedics;  Laterality: Left;  60 mins   WISDOM TOOTH EXTRACTION      Family History  Adopted: Yes  Problem Relation Age of Onset   Hypertension Mother    Diabetes Father     Social History   Socioeconomic History   Marital status: Single    Spouse name: Not on file   Number of children: 1   Years of education: Not on file   Highest education level: 12th grade  Occupational History   Occupation: Food service    Comment: [name]  Tobacco Use   Smoking  status: Never    Passive exposure: Never   Smokeless tobacco: Never  Vaping Use   Vaping Use: Never used  Substance and Sexual Activity   Alcohol use: Not Currently    Comment: social   Drug use: Not Currently    Frequency: 7.0 times per week    Types: Marijuana    Comment: stopped 3/6   Sexual activity: Yes  Other Topics Concern   Not on file  Social History Narrative   Not on file   Social Determinants of Health   Financial Resource Strain: Not on file  Food Insecurity: No Food Insecurity (01/27/2021)   Hunger Vital Sign    Worried About Running Out of Food in the Last Year: Never true    Ran Out of Food in the Last Year: Never true  Transportation Needs: No Transportation Needs (01/27/2021)   PRAPARE - Hydrologist (Medical): No    Lack of Transportation (Non-Medical): No  Physical Activity: Not on file  Stress: Not on file  Social Connections: Not on file  Intimate  Partner Violence: Not At Risk (07/19/2018)   Humiliation, Afraid, Rape, and Kick questionnaire    Fear of Current or Ex-Partner: No    Emotionally Abused: No    Physically Abused: No    Sexually Abused: No    Allergies  Allergen Reactions   Shellfish Allergy Nausea And Vomiting   Crab (Diagnostic) Nausea And Vomiting   Ibuprofen     Gastritis     No current facility-administered medications on file prior to encounter.   Current Outpatient Medications on File Prior to Encounter  Medication Sig Dispense Refill   omeprazole (PRILOSEC) 40 MG capsule Take 1 capsule (40 mg total) by mouth 2 (two) times daily with breakfast and lunch. 60 capsule 3     ROS Pertinent positives and negative per HPI, all others reviewed and negative  Physical Exam   BP 121/84 (BP Location: Right Arm)   Pulse 83   Temp 99.5 F (37.5 C) (Oral)   Resp 20   Ht 5' 0.5" (1.537 m)   Wt 51.2 kg   LMP 07/16/2022   SpO2 98%   BMI 21.69 kg/m   Patient Vitals for the past 24 hrs:  BP Temp Temp  src Pulse Resp SpO2 Height Weight  08/29/22 1130 121/84 99.5 F (37.5 C) Oral 83 20 98 % 5' 0.5" (1.537 m) 51.2 kg    Physical Exam Vitals and nursing note reviewed.  Constitutional:      General: She is not in acute distress.    Appearance: She is well-developed. She is not ill-appearing.  HENT:     Head: Normocephalic and atraumatic.  Eyes:     General: No scleral icterus.       Right eye: No discharge.        Left eye: No discharge.     Conjunctiva/sclera: Conjunctivae normal.  Pulmonary:     Effort: Pulmonary effort is normal. No respiratory distress.  Neurological:     General: No focal deficit present.     Mental Status: She is alert.  Psychiatric:        Mood and Affect: Mood normal.        Behavior: Behavior normal.      Labs Results for orders placed or performed during the hospital encounter of 08/29/22 (from the past 24 hour(s))  Pregnancy, urine POC     Status: Abnormal   Collection Time: 08/29/22 11:49 AM  Result Value Ref Range   Preg Test, Ur POSITIVE (A) NEGATIVE  Urinalysis, Routine w reflex microscopic -Urine, Clean Catch     Status: Abnormal   Collection Time: 08/29/22 11:57 AM  Result Value Ref Range   Color, Urine YELLOW YELLOW   APPearance HAZY (A) CLEAR   Specific Gravity, Urine 1.021 1.005 - 1.030   pH 8.0 5.0 - 8.0   Glucose, UA NEGATIVE NEGATIVE mg/dL   Hgb urine dipstick NEGATIVE NEGATIVE   Bilirubin Urine NEGATIVE NEGATIVE   Ketones, ur NEGATIVE NEGATIVE mg/dL   Protein, ur NEGATIVE NEGATIVE mg/dL   Nitrite NEGATIVE NEGATIVE   Leukocytes,Ua TRACE (A) NEGATIVE   RBC / HPF 0-5 0 - 5 RBC/hpf   WBC, UA 6-10 0 - 5 WBC/hpf   Bacteria, UA RARE (A) NONE SEEN   Squamous Epithelial / HPF 11-20 0 - 5 /HPF   Mucus PRESENT   Wet prep, genital     Status: Abnormal   Collection Time: 08/29/22 11:57 AM   Specimen: Urine, Clean Catch  Result Value Ref Range   Yeast Wet  Prep HPF POC NONE SEEN NONE SEEN   Trich, Wet Prep NONE SEEN NONE SEEN   Clue  Cells Wet Prep HPF POC PRESENT (A) NONE SEEN   WBC, Wet Prep HPF POC <10 <10   Sperm NONE SEEN   CBC     Status: None   Collection Time: 08/29/22 12:10 PM  Result Value Ref Range   WBC 8.2 4.0 - 10.5 K/uL   RBC 3.94 3.87 - 5.11 MIL/uL   Hemoglobin 13.2 12.0 - 15.0 g/dL   HCT 37.0 36.0 - 46.0 %   MCV 93.9 80.0 - 100.0 fL   MCH 33.5 26.0 - 34.0 pg   MCHC 35.7 30.0 - 36.0 g/dL   RDW 11.6 11.5 - 15.5 %   Platelets 182 150 - 400 K/uL   nRBC 0.0 0.0 - 0.2 %  hCG, quantitative, pregnancy     Status: Abnormal   Collection Time: 08/29/22 12:10 PM  Result Value Ref Range   hCG, Beta Chain, Quant, S 107,790 (H) <5 mIU/mL    Imaging US OB LESS THAN 14 WEEKS WITH OB TRANSVAGINAL  Result Date: 08/29/2022 CLINICAL DATA:  [id number] Abdominal pain during pregnancy in first trimester [id number] EXAM: OBSTETRIC <14 WK [place] AND TRANSVAGINAL OB US TECHNIQUE: Both transabdominal and transvaginal ultrasound examinations were performed for complete evaluation of the gestation as well as the maternal uterus, adnexal regions, and pelvic cul-de-sac. Transvaginal technique was performed to assess early pregnancy. COMPARISON:  Ultrasound 03/09/2021 FINDINGS: Intrauterine gestational sac: Single Yolk sac:  Visualized. Embryo:  Visualized. Cardiac Activity: Visualized. Heart Rate: 110 bpm CRL:  6.5 mm   6 w   3 d                  [place] EDC: 04/21/2023 Subchorionic hemorrhage:  Small subchorionic hemorrhage. Maternal uterus/adnexae: Normal ovaries with corpus luteum on the right. IMPRESSION: Single viable intrauterine pregnancy. Small subchorionic hemorrhage. Estimated gestational age [redacted] weeks 3 days. Electronically Signed   By: Maurine Simmering M.D.   On: 08/29/2022 13:12    MAU Course  Procedures Lab Orders         Wet prep, genital         Urinalysis, Routine w reflex microscopic -Urine, Clean Catch         CBC         hCG, quantitative, pregnancy         Pregnancy, urine POC    No orders of the defined types were placed in  this encounter.  Imaging Orders         US OB LESS THAN 14 WEEKS WITH OB TRANSVAGINAL     MDM mild  Assessment and Plan   1. Subchorionic hematoma in first trimester, single or unspecified fetus   2. [redacted] weeks gestation of pregnancy    Patient reports pink spotting & abdominal cramping. Ultrasound shows live IUP consistent with LMP dating & small subchorionic hemorrhage. She is RH positive. Reviewed results of ultrasound & discussed possible continued bleeding during remainder of first trimester. Discussed reasons to return to MAU. Will schedule new ob with [facility].    Dispo: discharged to home in stable condition.   Discharge Instructions     Discharge patient   Complete by: As directed    Discharge disposition: 01-Home or Self Care   Discharge patient date: 08/29/2022       Jorje Guild, NP 08/29/22 6:15 PM  Allergies as of 08/29/2022       Reactions  Shellfish Allergy Nausea And Vomiting   Crab (diagnostic) Nausea And Vomiting   Ibuprofen    Gastritis         Medication List     STOP taking these medications    sucralfate 1 g tablet Commonly known as: Carafate       TAKE these medications    omeprazole 40 MG capsule Commonly known as: PRILOSEC Take 1 capsule (40 mg total) by mouth 2 (two) times daily with breakfast and lunch.

## 2022-08-29 NOTE — Discharge Instructions (Signed)
Return to care  If you have heavier bleeding that soaks through more than 2 pads per hour for an hour or more If you bleed so much that you feel like you might pass out or you do pass out If you have significant abdominal pain that is not improved with Tylenol      [facility] Providers          [facility]  [address], [place], [place] [address]  [phone]  [facility]  [address], [place], [place] [address]  [phone]  [facility]  [address], [place], [place] [address]  [phone]  [facility] [address], [place], [place] [address] [phone]  [facility] [place] [facility]  [address], [place], [place] [address]  [phone]  [facility]  [address] floor), [place], [place] [address]  [phone]  [facility]  [address], [place], [place] [address]  [phone]  [facility]  [address], [place], [place] [address]  [phone]  [facility]  [address], [place], [place] [address]  [phone]  [facility]  [address], [place], [place] [address]  [phone]  [facility]  [address], [place], [place] [address]  [phone]  [facility]  [address], [place], [place] [address]  [phone]  [facility]   [address], [place], [place] [address]  [phone]  [facility]  [address], [place], [place] [address]   [phone]  [facility] [address], [place], [place] [address] [phone]  [facility]  [address], [place], [place] [address]  [phone]         

## 2022-08-29 NOTE — MAU Note (Signed)
Unable to locate results of preg test from 3/6, pt informed- she states it never went to 'mychart' either. Will retest and proceed as warranted.

## 2022-08-29 NOTE — MAU Note (Signed)
Elizabeth Shaffer is a 27 y.o. at [redacted]w[redacted]d here in MAU reporting: been having lower stomach and lower back pain, was having this since 3/6 - when she was seen.  Been having chills and nausea, started about the same time as confirmation (checked temp, no fever).  This morning when she went to the bathroom she saw blood. Is like her period is starting.  Has not soaked any pads- 'just started", did not put a pad on. LMP: 2/1 Onset of complaint: bleeding just started Pain score: 7 Vitals:   08/29/22 1130  BP: 121/84  Pulse: 83  Resp: 20  Temp: 99.5 F (37.5 C)  SpO2: 98%      Lab orders placed from triage:

## 2022-08-30 ENCOUNTER — Inpatient Hospital Stay (HOSPITAL_COMMUNITY)
Admission: AD | Admit: 2022-08-30 | Discharge: 2022-08-30 | Disposition: A | Discharge status: Home / Self Care | Payer: Medicaid Other | Attending: Obstetrics and Gynecology | Admitting: Obstetrics and Gynecology

## 2022-08-30 ENCOUNTER — Encounter (HOSPITAL_COMMUNITY): Payer: Self-pay | Admitting: Obstetrics and Gynecology

## 2022-08-30 DIAGNOSIS — E876 Hypokalemia: Secondary | ICD-10-CM

## 2022-08-30 DIAGNOSIS — O26891 Other specified pregnancy related conditions, first trimester: Secondary | ICD-10-CM | POA: Diagnosis not present

## 2022-08-30 DIAGNOSIS — O219 Vomiting of pregnancy, unspecified: Secondary | ICD-10-CM

## 2022-08-30 DIAGNOSIS — Z3A01 Less than 8 weeks gestation of pregnancy: Secondary | ICD-10-CM | POA: Diagnosis not present

## 2022-08-30 LAB — URINALYSIS, ROUTINE W REFLEX MICROSCOPIC
Bilirubin Urine: NEGATIVE
Glucose, UA: NEGATIVE mg/dL
Hgb urine dipstick: NEGATIVE
Ketones, ur: 80 mg/dL — AB
Nitrite: NEGATIVE
Protein, ur: 30 mg/dL — AB
Specific Gravity, Urine: 1.032 — ABNORMAL HIGH (ref 1.005–1.030)
pH: 5 (ref 5.0–8.0)

## 2022-08-30 LAB — COMPREHENSIVE METABOLIC PANEL
ALT: 23 U/L (ref 0–44)
AST: 20 U/L (ref 15–41)
Albumin: 4.5 g/dL (ref 3.5–5.0)
Alkaline Phosphatase: 37 U/L — ABNORMAL LOW (ref 38–126)
Anion gap: 15 (ref 5–15)
BUN: 10 mg/dL (ref 6–20)
CO2: 20 mmol/L — ABNORMAL LOW (ref 22–32)
Calcium: 9.8 mg/dL (ref 8.9–10.3)
Chloride: 102 mmol/L (ref 98–111)
Creatinine, Ser: 0.74 mg/dL (ref 0.44–1.00)
GFR, Estimated: >60 mL/min (ref >60)
Glucose, Bld: 88 mg/dL (ref 70–99)
Potassium: 3.4 mmol/L — ABNORMAL LOW (ref 3.5–5.1)
Sodium: 137 mmol/L (ref 135–145)
Total Bilirubin: 2.2 mg/dL — ABNORMAL HIGH (ref 0.3–1.2)
Total Protein: 8 g/dL (ref 6.5–8.1)

## 2022-08-30 LAB — CBC
HCT: 39.6 % (ref 36.0–46.0)
Hemoglobin: 14.2 g/dL (ref 12.0–15.0)
MCH: 33.2 pg (ref 26.0–34.0)
MCHC: 35.9 g/dL (ref 30.0–36.0)
MCV: 92.5 fL (ref 80.0–100.0)
Platelets: 219 10*3/uL (ref 150–400)
RBC: 4.28 MIL/uL (ref 3.87–5.11)
RDW: 11.4 % — ABNORMAL LOW (ref 11.5–15.5)
WBC: 10.6 10*3/uL — ABNORMAL HIGH (ref 4.0–10.5)
nRBC: 0 % (ref 0.0–0.2)

## 2022-08-30 MED ORDER — METOCLOPRAMIDE HCL 5 MG/ML IJ SOLN
10.0000 mg | Freq: Once | INTRAMUSCULAR | Status: AC
  Administered 2022-08-30: 10 mg via INTRAVENOUS
  Filled 2022-08-30: qty 2

## 2022-08-30 MED ORDER — METOCLOPRAMIDE HCL 10 MG PO TABS: 10.0000 mg | ORAL_TABLET | Freq: Four times a day (QID) | ORAL | 2 refills | Status: DC

## 2022-08-30 MED ORDER — ONDANSETRON HCL 4 MG/2ML IJ SOLN
4.0000 mg | Freq: Once | INTRAMUSCULAR | Status: AC
  Administered 2022-08-30: 4 mg via INTRAVENOUS
  Filled 2022-08-30: qty 2

## 2022-08-30 MED ORDER — ONDANSETRON 8 MG PO TBDP: 8.0000 mg | ORAL_TABLET | Freq: Three times a day (TID) | ORAL | 2 refills | Status: DC | PRN

## 2022-08-30 MED ORDER — LACTATED RINGERS IV BOLUS
1000.0000 mL | Freq: Once | INTRAVENOUS | Status: AC
  Administered 2022-08-30: 1000 mL via INTRAVENOUS

## 2022-08-30 MED ORDER — GLYCOPYRROLATE 1 MG PO TABS: 1.0000 mg | ORAL_TABLET | Freq: Three times a day (TID) | ORAL | 0 refills | Status: DC

## 2022-08-30 MED ORDER — PROMETHAZINE HCL 25 MG PO TABS: 25.0000 mg | ORAL_TABLET | Freq: Four times a day (QID) | ORAL | 2 refills | Status: DC | PRN

## 2022-08-30 MED ORDER — GLYCOPYRROLATE 0.2 MG/ML IJ SOLN
0.1000 mg | Freq: Once | INTRAMUSCULAR | Status: AC
  Administered 2022-08-30: 0.1 mg via INTRAVENOUS
  Filled 2022-08-30: qty 1

## 2022-08-30 MED ORDER — POTASSIUM CHLORIDE CRYS ER 20 MEQ PO TBCR: 20.0000 meq | EXTENDED_RELEASE_TABLET | Freq: Two times a day (BID) | ORAL | 0 refills | Status: DC

## 2022-08-30 NOTE — MAU Note (Signed)
.  Elizabeth Shaffer is a 27 y.o. at [redacted]w[redacted]d here in MAU reporting:   Since yesterday after she Left MAU pt started Feeling hot and cold on and off. One moment they are freezing and then she reports being super hot. Pt states she has not been able to keep anything down and has vomited 5 times in the last 24 hours. Pt has tried taking dramamine at 4pm and scope patch and also wrist bracelet.   No LOF, No bleeding.     Pain score: 6/10  mid abdominal pain due to vomiting.  Vitals:   08/30/22 2119  BP: 106/67  Pulse: 73  Resp: 16  Temp: 98.5 F (36.9 C)  SpO2: 100%     Lab orders placed from triage:

## 2022-08-30 NOTE — Discharge Instructions (Signed)

## 2022-08-30 NOTE — MAU Provider Note (Signed)
History     CSN: [id number]  Arrival date and time: 08/30/22 2047   Event Date/Time   First Provider Initiated Contact with Patient 08/30/22 2135      Chief Complaint  Patient presents with   Nausea   Emesis   HPI  Elizabeth Shaffer is a 27 y.o. [id number] at [redacted]w[redacted]d who presents for evaluation of nausea and vomiting. Patient reports she has vomited 5 times in the last 24 hours. She also feels like she is struggling with spitting. She reports pain in her abdomen when she vomits but no pain now. She denies any vaginal bleeding and discharge. Denies any constipation, diarrhea or any urinary complaints.  OB History     Gravida  4   Para  1   Term  1   Preterm  0   AB  2   Living  1      SAB  1   IAB  1   Ectopic  0   Multiple  0   Live Births  1           Past Medical History:  Diagnosis Date   Anemia    during pregnancy   Displaced fracture of fifth metacarpal bone of left hand    Gastritis    GERD (gastroesophageal reflux disease)    Headache    HTN (hypertension)    with preg   Migraines    PONV (postoperative nausea and vomiting)    UTI (urinary tract infection)     Past Surgical History:  Procedure Laterality Date   hand     carpule tunnel   MOUTH SURGERY  2011   MULTIPLE TOOTH EXTRACTIONS     OPEN REDUCTION INTERNAL FIXATION (ORIF) METACARPAL Left 10/31/2019   Procedure: OPEN REDUCTION INTERNAL FIXATION (ORIF) LEFT FIFTH METACARPAL FRACTURE USING EXSOMED NAIL 4.62mm x 39mm;  Surgeon: Roseanne Kaufman, MD;  Location: [facility];  Service: Orthopedics;  Laterality: Left;  60 mins   WISDOM TOOTH EXTRACTION      Family History  Adopted: Yes  Problem Relation Age of Onset   Hypertension Mother    Diabetes Father     Social History   Tobacco Use   Smoking status: Never    Passive exposure: Never   Smokeless tobacco: Never  Vaping Use   Vaping Use: Never used  Substance Use Topics   Alcohol use: Not Currently    Comment: social   Drug use: Not  Currently    Frequency: 7.0 times per week    Types: Marijuana    Comment: stopped 3/6    Allergies:  Allergies  Allergen Reactions   Shellfish Allergy Nausea And Vomiting   Crab (Diagnostic) Nausea And Vomiting   Ibuprofen     Gastritis     No medications prior to admission.    Review of Systems  Constitutional: Negative.  Negative for fatigue and fever.  HENT: Negative.    Respiratory: Negative.  Negative for shortness of breath.   Cardiovascular: Negative.  Negative for chest pain.  Gastrointestinal:  Positive for nausea and vomiting. Negative for abdominal pain, constipation and diarrhea.  Genitourinary: Negative.  Negative for dysuria, vaginal bleeding and vaginal discharge.  Neurological: Negative.  Negative for dizziness and headaches.   Physical Exam   Blood pressure 117/69, pulse (!) 59, temperature 98.5 F (36.9 C), temperature source Oral, resp. rate 16, height 5' (1.524 m), weight 49.7 kg, last menstrual period 07/16/2022, SpO2 98 %.  Patient Vitals for the past 24  hrs:  BP Temp Temp src Pulse Resp SpO2 Height Weight  08/30/22 2345 117/69 -- -- (!) 59 16 -- -- --  08/30/22 2330 -- -- -- -- -- 98 % -- --  08/30/22 2315 -- -- -- -- -- 98 % -- --  08/30/22 2300 -- -- -- -- -- 98 % -- --  08/30/22 2245 -- -- -- -- -- 98 % -- --  08/30/22 2230 -- -- -- -- -- 98 % -- --  08/30/22 2215 -- -- -- -- -- 100 % -- --  08/30/22 2200 -- -- -- -- -- 99 % -- --  08/30/22 2145 -- -- -- -- -- 99 % -- --  08/30/22 2130 -- -- -- -- -- 100 % -- --  08/30/22 2119 106/67 98.5 F (36.9 C) Oral 73 16 100 % 5' (1.524 m) 49.7 kg    Physical Exam Vitals and nursing note reviewed.  Constitutional:      General: She is not in acute distress.    Appearance: She is well-developed.  HENT:     Head: Normocephalic.  Eyes:     Pupils: Pupils are equal, round, and reactive to light.  Cardiovascular:     Rate and Rhythm: Normal rate and regular rhythm.     Heart sounds: Normal heart  sounds.  Pulmonary:     Effort: Pulmonary effort is normal. No respiratory distress.     Breath sounds: Normal breath sounds.  Abdominal:     General: Bowel sounds are normal. There is no distension.     Palpations: Abdomen is soft.     Tenderness: There is no abdominal tenderness.  Skin:    General: Skin is warm and dry.  Neurological:     Mental Status: She is alert and oriented to person, place, and time.  Psychiatric:        Mood and Affect: Mood normal.        Behavior: Behavior normal.        Thought Content: Thought content normal.        Judgment: Judgment normal.      MAU Course  Procedures  Results for orders placed or performed during the hospital encounter of 08/30/22 (from the past 24 hour(s))  Urinalysis, Routine w reflex microscopic -Urine, Clean Catch     Status: Abnormal   Collection Time: 08/30/22  9:36 PM  Result Value Ref Range   Color, Urine AMBER (A) YELLOW   APPearance HAZY (A) CLEAR   Specific Gravity, Urine 1.032 (H) 1.005 - 1.030   pH 5.0 5.0 - 8.0   Glucose, UA NEGATIVE NEGATIVE mg/dL   Hgb urine dipstick NEGATIVE NEGATIVE   Bilirubin Urine NEGATIVE NEGATIVE   Ketones, ur 80 (A) NEGATIVE mg/dL   Protein, ur 30 (A) NEGATIVE mg/dL   Nitrite NEGATIVE NEGATIVE   Leukocytes,Ua TRACE (A) NEGATIVE   RBC / HPF 6-10 0 - 5 RBC/hpf   WBC, UA 0-5 0 - 5 WBC/hpf   Bacteria, UA RARE (A) NONE SEEN   Squamous Epithelial / HPF 0-5 0 - 5 /HPF   Mucus PRESENT   CBC     Status: Abnormal   Collection Time: 08/30/22 10:05 PM  Result Value Ref Range   WBC 10.6 (H) 4.0 - 10.5 K/uL   RBC 4.28 3.87 - 5.11 MIL/uL   Hemoglobin 14.2 12.0 - 15.0 g/dL   HCT 39.6 36.0 - 46.0 %   MCV 92.5 80.0 - 100.0 fL   MCH 33.2 26.0 -  34.0 pg   MCHC 35.9 30.0 - 36.0 g/dL   RDW 11.4 (L) 11.5 - 15.5 %   Platelets 219 150 - 400 K/uL   nRBC 0.0 0.0 - 0.2 %  Comprehensive metabolic panel     Status: Abnormal   Collection Time: 08/30/22 10:05 PM  Result Value Ref Range   Sodium 137  135 - 145 mmol/L   Potassium 3.4 (L) 3.5 - 5.1 mmol/L   Chloride 102 98 - 111 mmol/L   CO2 20 (L) 22 - 32 mmol/L   Glucose, Bld 88 70 - 99 mg/dL   BUN 10 6 - 20 mg/dL   Creatinine, Ser 0.74 0.44 - 1.00 mg/dL   Calcium 9.8 8.9 - 10.3 mg/dL   Total Protein 8.0 6.5 - 8.1 g/dL   Albumin 4.5 3.5 - 5.0 g/dL   AST 20 15 - 41 U/L   ALT 23 0 - 44 U/L   Alkaline Phosphatase 37 (L) 38 - 126 U/L   Total Bilirubin 2.2 (H) 0.3 - 1.2 mg/dL   GFR, Estimated >60 >60 mL/min   Anion gap 15 5 - 15    MDM Labs ordered and reviewed.   UA LR bolus Reglan Zofran  Robinul  CBC, CMP  Patient reports resolution of nausea and vomiting.   Assessment and Plan   1. Nausea/vomiting in pregnancy   2. [redacted] weeks gestation of pregnancy   3. Hypokalemia     -Discharge home in stable condition -Rx for antiemetics and K-Dur sent to pharmacy -First trimester precautions discussed -Patient advised to follow-up with OB as scheduled for prenatal care -Patient may return to MAU as needed or if her condition were to change or worsen  Wende Mott, CNM 08/31/2022, 9:35 PM

## 2022-08-31 LAB — GC/CHLAMYDIA PROBE AMP (~~LOC~~) NOT AT ARMC
Chlamydia: NEGATIVE
Comment: NEGATIVE
Comment: NORMAL
Neisseria Gonorrhea: NEGATIVE

## 2022-09-07 ENCOUNTER — Encounter (HOSPITAL_COMMUNITY): Payer: Self-pay | Admitting: Family Medicine

## 2022-09-07 ENCOUNTER — Inpatient Hospital Stay (HOSPITAL_COMMUNITY)
Admission: AD | Admit: 2022-09-07 | Discharge: 2022-09-07 | Disposition: A | Discharge status: Home / Self Care | Payer: Medicaid Other | Attending: Family Medicine | Admitting: Family Medicine

## 2022-09-07 ENCOUNTER — Other Ambulatory Visit: Payer: Self-pay | Admitting: Family Medicine

## 2022-09-07 DIAGNOSIS — Z3A01 Less than 8 weeks gestation of pregnancy: Secondary | ICD-10-CM | POA: Diagnosis not present

## 2022-09-07 DIAGNOSIS — O210 Mild hyperemesis gravidarum: Secondary | ICD-10-CM | POA: Insufficient documentation

## 2022-09-07 DIAGNOSIS — O26891 Other specified pregnancy related conditions, first trimester: Secondary | ICD-10-CM | POA: Diagnosis not present

## 2022-09-07 DIAGNOSIS — K2100 Gastro-esophageal reflux disease with esophagitis, without bleeding: Secondary | ICD-10-CM | POA: Insufficient documentation

## 2022-09-07 DIAGNOSIS — R1110 Vomiting, unspecified: Secondary | ICD-10-CM

## 2022-09-07 DIAGNOSIS — O161 Unspecified maternal hypertension, first trimester: Secondary | ICD-10-CM | POA: Diagnosis not present

## 2022-09-07 LAB — COMPREHENSIVE METABOLIC PANEL
ALT: 17 U/L (ref 0–44)
AST: 15 U/L (ref 15–41)
Albumin: 4.3 g/dL (ref 3.5–5.0)
Alkaline Phosphatase: 37 U/L — ABNORMAL LOW (ref 38–126)
Anion gap: 13 (ref 5–15)
BUN: 7 mg/dL (ref 6–20)
CO2: 21 mmol/L — ABNORMAL LOW (ref 22–32)
Calcium: 9.6 mg/dL (ref 8.9–10.3)
Chloride: 101 mmol/L (ref 98–111)
Creatinine, Ser: 0.67 mg/dL (ref 0.44–1.00)
GFR, Estimated: >60 mL/min (ref >60)
Glucose, Bld: 70 mg/dL (ref 70–99)
Potassium: 3.9 mmol/L (ref 3.5–5.1)
Sodium: 135 mmol/L (ref 135–145)
Total Bilirubin: 1.6 mg/dL — ABNORMAL HIGH (ref 0.3–1.2)
Total Protein: 7.7 g/dL (ref 6.5–8.1)

## 2022-09-07 LAB — CBC WITH DIFFERENTIAL/PLATELET
Abs Immature Granulocytes: 0.02 10*3/uL (ref 0.00–0.07)
Basophils Absolute: 0 10*3/uL (ref 0.0–0.1)
Basophils Relative: 0 %
Eosinophils Absolute: 0 10*3/uL (ref 0.0–0.5)
Eosinophils Relative: 0 %
HCT: 38.5 % (ref 36.0–46.0)
Hemoglobin: 14 g/dL (ref 12.0–15.0)
Immature Granulocytes: 0 %
Lymphocytes Relative: 27 %
Lymphs Abs: 2.3 10*3/uL (ref 0.7–4.0)
MCH: 33.5 pg (ref 26.0–34.0)
MCHC: 36.4 g/dL — ABNORMAL HIGH (ref 30.0–36.0)
MCV: 92.1 fL (ref 80.0–100.0)
Monocytes Absolute: 0.6 10*3/uL (ref 0.1–1.0)
Monocytes Relative: 7 %
Neutro Abs: 5.5 10*3/uL (ref 1.7–7.7)
Neutrophils Relative %: 66 %
Platelets: 207 10*3/uL (ref 150–400)
RBC: 4.18 MIL/uL (ref 3.87–5.11)
RDW: 10.9 % — ABNORMAL LOW (ref 11.5–15.5)
WBC: 8.4 10*3/uL (ref 4.0–10.5)
nRBC: 0 % (ref 0.0–0.2)

## 2022-09-07 LAB — URINALYSIS, ROUTINE W REFLEX MICROSCOPIC
Bilirubin Urine: NEGATIVE
Glucose, UA: NEGATIVE mg/dL
Hgb urine dipstick: NEGATIVE
Ketones, ur: 80 mg/dL — AB
Nitrite: NEGATIVE
Protein, ur: 100 mg/dL — AB
Specific Gravity, Urine: 1.033 — ABNORMAL HIGH (ref 1.005–1.030)
pH: 5 (ref 5.0–8.0)

## 2022-09-07 LAB — LIPASE, BLOOD: Lipase: 32 U/L (ref 11–51)

## 2022-09-07 MED ORDER — SODIUM CHLORIDE 0.9 % IV SOLN
8.0000 mg | Freq: Once | INTRAVENOUS | Status: AC
  Administered 2022-09-07: 8 mg via INTRAVENOUS
  Filled 2022-09-07: qty 4

## 2022-09-07 MED ORDER — PANTOPRAZOLE SODIUM 40 MG IV SOLR
40.0000 mg | Freq: Once | INTRAVENOUS | Status: AC
  Administered 2022-09-07: 40 mg via INTRAVENOUS
  Filled 2022-09-07: qty 10

## 2022-09-07 MED ORDER — LACTATED RINGERS IV BOLUS
1000.0000 mL | Freq: Once | INTRAVENOUS | Status: AC
  Administered 2022-09-07: 1000 mL via INTRAVENOUS

## 2022-09-07 MED ORDER — GLYCOPYRROLATE 1 MG PO TABS: 1.0000 mg | ORAL_TABLET | Freq: Three times a day (TID) | ORAL | 0 refills | Status: DC

## 2022-09-07 MED ORDER — LANSOPRAZOLE 30 MG PO TBDD: 30.0000 mg | DELAYED_RELEASE_TABLET | Freq: Every day | ORAL | 3 refills | Status: DC

## 2022-09-07 NOTE — MAU Provider Note (Signed)
History     CSN: [id number]  Arrival date and time: 09/07/22 1123   None     Chief Complaint  Patient presents with   Emesis   Nausea   HPI This is a 27 year old G4 P1-0-2-1 at 7 weeks and 4 days by LMP with a history of gestational hypertension, gastritis/GERD on PPIs.  Patient seen for week of vomiting and intolerance of oral food and liquids.  Emesis described as stomach contents. Also has a lot of burning pain in her epigastric area. No fevers, chills. She has promethazine, zofran at home, which have not been helpful. She is supposed to be taking omeprazole, but hasn't been able to take this. She does have a GI doctor due to the gastritis.  She used to smoke marijuana fairly consistently, but has not smoked since she found out she was pregnant at the beginning of march.   OB History     Gravida  4   Para  1   Term  1   Preterm  0   AB  2   Living  1      SAB  1   IAB  1   Ectopic  0   Multiple  0   Live Births  1           Past Medical History:  Diagnosis Date   Anemia    during pregnancy   Displaced fracture of fifth metacarpal bone of left hand    Gastritis    GERD (gastroesophageal reflux disease)    Headache    HTN (hypertension)    with preg   Migraines    PONV (postoperative nausea and vomiting)    UTI (urinary tract infection)     Past Surgical History:  Procedure Laterality Date   hand     carpule tunnel   MOUTH SURGERY  2011   MULTIPLE TOOTH EXTRACTIONS     OPEN REDUCTION INTERNAL FIXATION (ORIF) METACARPAL Left 10/31/2019   Procedure: OPEN REDUCTION INTERNAL FIXATION (ORIF) LEFT FIFTH METACARPAL FRACTURE USING EXSOMED NAIL 4.19mm x 75mm;  Surgeon: Roseanne Kaufman, MD;  Location: [facility];  Service: Orthopedics;  Laterality: Left;  60 mins   WISDOM TOOTH EXTRACTION      Family History  Adopted: Yes  Problem Relation Age of Onset   Hypertension Mother    Diabetes Father     Social History   Tobacco Use   Smoking status:  Never    Passive exposure: Never   Smokeless tobacco: Never  Vaping Use   Vaping Use: Never used  Substance Use Topics   Alcohol use: Not Currently    Comment: social   Drug use: Not Currently    Frequency: 7.0 times per week    Types: Marijuana    Comment: stopped 3/6    Allergies:  Allergies  Allergen Reactions   Shellfish Allergy Nausea And Vomiting   Crab (Diagnostic) Nausea And Vomiting   Ibuprofen     Gastritis     Medications Prior to Admission  Medication Sig Dispense Refill Last Dose   glycopyrrolate (ROBINUL) 1 MG tablet Take 1 tablet (1 mg total) by mouth 3 (three) times daily. 90 tablet 0 Past Week   metoCLOPramide (REGLAN) 10 MG tablet Take 1 tablet (10 mg total) by mouth every 6 (six) hours. 30 tablet 2 Past Week   omeprazole (PRILOSEC) 40 MG capsule Take 1 capsule (40 mg total) by mouth 2 (two) times daily with breakfast and lunch. 60 capsule 3  Past Week   ondansetron (ZOFRAN-ODT) 8 MG disintegrating tablet Take 1 tablet (8 mg total) by mouth every 8 (eight) hours as needed for nausea or vomiting. 30 tablet 2 09/06/2022   promethazine (PHENERGAN) 25 MG tablet Take 1 tablet (25 mg total) by mouth every 6 (six) hours as needed for nausea or vomiting. 30 tablet 2 Past Week   potassium chloride SA (KLOR-CON M) 20 MEQ tablet Take 1 tablet (20 mEq total) by mouth 2 (two) times daily for 3 days. 6 tablet 0     Review of Systems Physical Exam   Blood pressure 110/68, pulse 92, temperature 98.3 F (36.8 C), temperature source Oral, resp. rate 14, weight 48.4 kg, last menstrual period 07/16/2022, SpO2 100 %.  Physical Exam Vitals and nursing note reviewed.  Constitutional:      Appearance: Normal appearance.  HENT:     Head: Normocephalic and atraumatic.  Eyes:     Pupils: Pupils are equal, round, and reactive to light.  Cardiovascular:     Rate and Rhythm: Normal rate.  Pulmonary:     Effort: Pulmonary effort is normal.  Abdominal:     General: Abdomen is  flat.     Palpations: Abdomen is soft.     Tenderness: There is abdominal tenderness (epigastric). There is no right CVA tenderness, left CVA tenderness, guarding or rebound.  Skin:    General: Skin is warm and dry.     Capillary Refill: Capillary refill takes less than 2 seconds.  Neurological:     General: No focal deficit present.     Mental Status: She is alert.  Psychiatric:        Mood and Affect: Mood normal.        Behavior: Behavior normal.        Thought Content: Thought content normal.    Results for orders placed or performed during the hospital encounter of 09/07/22 (from the past 24 hour(s))  Urinalysis, Routine w reflex microscopic -Urine, Clean Catch     Status: Abnormal   Collection Time: 09/07/22 12:00 PM  Result Value Ref Range   Color, Urine AMBER (A) YELLOW   APPearance HAZY (A) CLEAR   Specific Gravity, Urine 1.033 (H) 1.005 - 1.030   pH 5.0 5.0 - 8.0   Glucose, UA NEGATIVE NEGATIVE mg/dL   Hgb urine dipstick NEGATIVE NEGATIVE   Bilirubin Urine NEGATIVE NEGATIVE   Ketones, ur 80 (A) NEGATIVE mg/dL   Protein, ur 100 (A) NEGATIVE mg/dL   Nitrite NEGATIVE NEGATIVE   Leukocytes,Ua SMALL (A) NEGATIVE   RBC / HPF 0-5 0 - 5 RBC/hpf   WBC, UA 21-50 0 - 5 WBC/hpf   Bacteria, UA RARE (A) NONE SEEN   Squamous Epithelial / HPF 11-20 0 - 5 /HPF   Mucus PRESENT   CBC with Differential/Platelet     Status: Abnormal   Collection Time: 09/07/22 12:42 PM  Result Value Ref Range   WBC 8.4 4.0 - 10.5 K/uL   RBC 4.18 3.87 - 5.11 MIL/uL   Hemoglobin 14.0 12.0 - 15.0 g/dL   HCT 38.5 36.0 - 46.0 %   MCV 92.1 80.0 - 100.0 fL   MCH 33.5 26.0 - 34.0 pg   MCHC 36.4 (H) 30.0 - 36.0 g/dL   RDW 10.9 (L) 11.5 - 15.5 %   Platelets 207 150 - 400 K/uL   nRBC 0.0 0.0 - 0.2 %   Neutrophils Relative % 66 %   Neutro Abs 5.5 1.7 - 7.7 K/uL  Lymphocytes Relative 27 %   Lymphs Abs 2.3 0.7 - 4.0 K/uL   Monocytes Relative 7 %   Monocytes Absolute 0.6 0.1 - 1.0 K/uL   Eosinophils  Relative 0 %   Eosinophils Absolute 0.0 0.0 - 0.5 K/uL   Basophils Relative 0 %   Basophils Absolute 0.0 0.0 - 0.1 K/uL   Immature Granulocytes 0 %   Abs Immature Granulocytes 0.02 0.00 - 0.07 K/uL  Comprehensive metabolic panel     Status: Abnormal   Collection Time: 09/07/22 12:42 PM  Result Value Ref Range   Sodium 135 135 - 145 mmol/L   Potassium 3.9 3.5 - 5.1 mmol/L   Chloride 101 98 - 111 mmol/L   CO2 21 (L) 22 - 32 mmol/L   Glucose, Bld 70 70 - 99 mg/dL   BUN 7 6 - 20 mg/dL   Creatinine, Ser 0.67 0.44 - 1.00 mg/dL   Calcium 9.6 8.9 - 10.3 mg/dL   Total Protein 7.7 6.5 - 8.1 g/dL   Albumin 4.3 3.5 - 5.0 g/dL   AST 15 15 - 41 U/L   ALT 17 0 - 44 U/L   Alkaline Phosphatase 37 (L) 38 - 126 U/L   Total Bilirubin 1.6 (H) 0.3 - 1.2 mg/dL   GFR, Estimated >60 >60 mL/min   Anion gap 13 5 - 15  Lipase, blood     Status: None   Collection Time: 09/07/22 12:42 PM  Result Value Ref Range   Lipase 32 11 - 51 U/L    No results found.   MAU Course  Procedures  MDM Check CBC, CMP, Lipase Bolus LR x2L Zofran 8mg  and protonix 40mg  given.  Improved and tolerated oral crackers and ice.  Assessment and Plan   1. [redacted] weeks gestation of pregnancy   2. Hyperemesis   3. Gastroesophageal reflux disease with esophagitis without hemorrhage    Will give Prevacid oral dissolving tablets for GERD/gastritis. Continue zofran. DIscussed robinul.   Truett Mainland 09/07/2022, 12:38 PM

## 2022-09-07 NOTE — MAU Note (Addendum)
.  Elizabeth Shaffer is a 27 y.o. at [redacted]w[redacted]d here in MAU reporting: for x1 week she hasn't been able to keep anything down and has had a headache. She also reports having a history of gastritis and states she feels like her stomach is on fire. Denies VB. Has meds at home but has not been able to keep them down. Last had ODT zofran yesterday.   Pain score: upper abdomen- 10 headache- 10 Vitals:   09/07/22 1153  BP: 110/68  Pulse: 92  Resp: 14  Temp: 98.3 F (36.8 C)  SpO2: 100%      Lab orders placed from triage:  UA

## 2022-09-08 ENCOUNTER — Inpatient Hospital Stay (HOSPITAL_COMMUNITY)
Admission: AD | Admit: 2022-09-08 | Discharge: 2022-09-08 | Disposition: A | Discharge status: Home / Self Care | Payer: Medicaid Other | Attending: Family Medicine | Admitting: Family Medicine

## 2022-09-08 ENCOUNTER — Encounter (HOSPITAL_COMMUNITY): Payer: Self-pay | Admitting: Family Medicine

## 2022-09-08 DIAGNOSIS — O9A211 Injury, poisoning and certain other consequences of external causes complicating pregnancy, first trimester: Secondary | ICD-10-CM | POA: Insufficient documentation

## 2022-09-08 DIAGNOSIS — R102 Pelvic and perineal pain: Secondary | ICD-10-CM | POA: Diagnosis not present

## 2022-09-08 DIAGNOSIS — N898 Other specified noninflammatory disorders of vagina: Secondary | ICD-10-CM | POA: Insufficient documentation

## 2022-09-08 DIAGNOSIS — T433X5A Adverse effect of phenothiazine antipsychotics and neuroleptics, initial encounter: Secondary | ICD-10-CM | POA: Insufficient documentation

## 2022-09-08 DIAGNOSIS — Z3A01 Less than 8 weeks gestation of pregnancy: Secondary | ICD-10-CM | POA: Diagnosis not present

## 2022-09-08 DIAGNOSIS — T50905A Adverse effect of unspecified drugs, medicaments and biological substances, initial encounter: Secondary | ICD-10-CM

## 2022-09-08 MED ORDER — ACETAMINOPHEN 500 MG PO TABS
1000.0000 mg | ORAL_TABLET | Freq: Once | ORAL | Status: AC
  Administered 2022-09-08: 1000 mg via ORAL
  Filled 2022-09-08: qty 2

## 2022-09-08 MED ORDER — NYSTATIN-TRIAMCINOLONE 100000-0.1 UNIT/GM-% EX CREA
TOPICAL_CREAM | Freq: Two times a day (BID) | CUTANEOUS | Status: DC
  Filled 2022-09-08: qty 15

## 2022-09-08 MED ORDER — NYSTATIN-TRIAMCINOLONE 100000-0.1 UNIT/GM-% EX CREA: TOPICAL_CREAM | Freq: Two times a day (BID) | CUTANEOUS | 0 refills | Status: DC

## 2022-09-08 MED ORDER — DIPHENHYDRAMINE HCL 25 MG PO CAPS
25.0000 mg | ORAL_CAPSULE | Freq: Four times a day (QID) | ORAL | Status: DC | PRN
  Administered 2022-09-08: 25 mg via ORAL
  Filled 2022-09-08: qty 1

## 2022-09-08 NOTE — MAU Note (Signed)
Took promethazine aorund 9 pm and vagina started burning an hour later. Reports that it is swollen. Does not have any vaignal bleeding. 122/ 73 89 100% 113.3 5.5 98.4

## 2022-09-08 NOTE — MAU Provider Note (Signed)
History     CSN: [id number]  Arrival date and time: 09/08/22 0142   Event Date/Time   First Provider Initiated Contact with Patient 09/08/22 0246      Chief Complaint  Patient presents with   Vaginal Burning   Elizabeth Shaffer , a  27 y.o. [id number] at [redacted]w[redacted]d presents to MAU with complaints of vaginal itching and swelling after inserting a promethazine tablet. Patient states after her shower, she inserted the tablet and immediately noted a burning sensation. Insertion was 2130. Patient states at 10:30pm that sensation intensified and currently rates pain a 10/10. She denies burning with urination, but noted intense pain with wiping. She states she also feels "swollen." She denies abnormal vaginal discharge, or urinary symptoms prior to insertion. She states she used promethazine intravaginally in her previous pregnancy without complications. She denies vaginal bleeding. She states it hurts to wear clothes and sit down.          OB History     Gravida  4   Para  1   Term  1   Preterm  0   AB  2   Living  1      SAB  1   IAB  1   Ectopic  0   Multiple  0   Live Births  1           Past Medical History:  Diagnosis Date   Anemia    during pregnancy   Displaced fracture of fifth metacarpal bone of left hand    Gastritis    GERD (gastroesophageal reflux disease)    Headache    HTN (hypertension)    with preg   Migraines    PONV (postoperative nausea and vomiting)    UTI (urinary tract infection)     Past Surgical History:  Procedure Laterality Date   hand     carpule tunnel   MOUTH SURGERY  2011   MULTIPLE TOOTH EXTRACTIONS     OPEN REDUCTION INTERNAL FIXATION (ORIF) METACARPAL Left 10/31/2019   Procedure: OPEN REDUCTION INTERNAL FIXATION (ORIF) LEFT FIFTH METACARPAL FRACTURE USING EXSOMED NAIL 4.33mm x 49mm;  Surgeon: Roseanne Kaufman, MD;  Location: [facility];  Service: Orthopedics;  Laterality: Left;  60 mins   WISDOM TOOTH EXTRACTION      Family History   Adopted: Yes  Problem Relation Age of Onset   Hypertension Mother    Diabetes Father     Social History   Tobacco Use   Smoking status: Never    Passive exposure: Never   Smokeless tobacco: Never  Vaping Use   Vaping Use: Never used  Substance Use Topics   Alcohol use: Not Currently    Comment: social   Drug use: Not Currently    Frequency: 7.0 times per week    Types: Marijuana    Comment: stopped 3/6    Allergies:  Allergies  Allergen Reactions   Shellfish Allergy Nausea And Vomiting   Crab (Diagnostic) Nausea And Vomiting   Ibuprofen     Gastritis     Medications Prior to Admission  Medication Sig Dispense Refill Last Dose   glycopyrrolate (ROBINUL) 1 MG tablet Take 1 tablet (1 mg total) by mouth 3 (three) times daily. 90 tablet 0    lansoprazole (PREVACID SOLUTAB) 30 MG disintegrating tablet Take 1 tablet (30 mg total) by mouth daily at 12 noon. 60 tablet 3    metoCLOPramide (REGLAN) 10 MG tablet Take 1 tablet (10 mg total) by mouth every  6 (six) hours. 30 tablet 2    ondansetron (ZOFRAN-ODT) 8 MG disintegrating tablet Take 1 tablet (8 mg total) by mouth every 8 (eight) hours as needed for nausea or vomiting. 30 tablet 2    promethazine (PHENERGAN) 25 MG tablet Take 1 tablet (25 mg total) by mouth every 6 (six) hours as needed for nausea or vomiting. 30 tablet 2     Review of Systems  Constitutional:  Negative for chills, fatigue and fever.  Eyes:  Negative for pain and visual disturbance.  Respiratory:  Negative for apnea, shortness of breath and wheezing.   Cardiovascular:  Negative for chest pain and palpitations.  Gastrointestinal:  Negative for abdominal pain, constipation, diarrhea, nausea and vomiting.  Genitourinary:  Positive for vaginal pain. Negative for difficulty urinating, dysuria, pelvic pain, vaginal bleeding and vaginal discharge.  Musculoskeletal:  Negative for back pain.  Neurological:  Negative for seizures, weakness and headaches.   Psychiatric/Behavioral:  Negative for suicidal ideas.    Physical Exam   Blood pressure 126/76, pulse 70, temperature 98.1 F (36.7 C), temperature source Oral, resp. rate 16, last menstrual period 07/16/2022, SpO2 100 %.  Physical Exam Vitals and nursing note reviewed.  Constitutional:      General: She is in acute distress.     Appearance: Normal appearance.  HENT:     Head: Normocephalic.  Pulmonary:     Effort: Pulmonary effort is normal.  Genitourinary:    Labia:        Right: Tenderness present.        Left: Tenderness present.        Comments: Significant swelling noted bilaterally to labial minora. No redness or irritation noted.  Significantly tender to touch.  Musculoskeletal:     Cervical back: Normal range of motion.  Skin:    General: Skin is warm and dry.  Neurological:     Mental Status: She is alert and oriented to person, place, and time.  Psychiatric:        Mood and Affect: Mood normal.     MAU Course  Procedures Meds ordered this encounter  Medications   nystatin-triamcinolone (MYCOLOG II) cream   diphenhydrAMINE (BENADRYL) capsule 25 mg   acetaminophen (TYLENOL) tablet 1,000 mg    MDM - Ice packs provided.  - Pain decreased from 10/10 to 6/10 and patient states she can comfortably sit down now.  - Plan for discharge.   Assessment and Plan   1. Vaginal irritation   2. Allergic reaction of correct medicinal substance properly administered   3. [redacted] weeks gestation of pregnancy    - Encouraged continued use of ice packs, and nystatin cream for the next day or so.  - Worsening signs and return precautions reviewed. - Recommended to not repeat vaginal promethazine.  - Patient discharged home in stable condition and may return to MAU as needed.    Jacquiline Doe, MSN CNM  09/08/2022, 2:46 AM

## 2022-09-10 ENCOUNTER — Other Ambulatory Visit: Payer: Self-pay | Admitting: Family Medicine

## 2022-09-10 DIAGNOSIS — R8271 Bacteriuria: Secondary | ICD-10-CM | POA: Insufficient documentation

## 2022-09-10 LAB — CULTURE, OB URINE: Culture: 20000 — AB

## 2022-09-10 MED ORDER — AMOXICILLIN-POT CLAVULANATE 875-125 MG PO TABS: 1.0000 | ORAL_TABLET | Freq: Two times a day (BID) | ORAL | 0 refills | Status: AC

## 2022-11-05 ENCOUNTER — Ambulatory Visit (INDEPENDENT_AMBULATORY_CARE_PROVIDER_SITE_OTHER): Payer: Medicaid Other | Admitting: Internal Medicine

## 2022-11-05 ENCOUNTER — Encounter: Payer: Self-pay | Admitting: Internal Medicine

## 2022-11-05 VITALS — BP 118/66 | HR 76 | Ht 60.5 in | Wt 116.2 lb

## 2022-11-05 DIAGNOSIS — K219 Gastro-esophageal reflux disease without esophagitis: Secondary | ICD-10-CM

## 2022-11-05 DIAGNOSIS — K5904 Chronic idiopathic constipation: Secondary | ICD-10-CM | POA: Diagnosis not present

## 2022-11-05 MED ORDER — LANSOPRAZOLE 30 MG PO CPDR: 30.0000 mg | DELAYED_RELEASE_CAPSULE | Freq: Every day | ORAL | 5 refills | Status: AC

## 2022-11-05 NOTE — Patient Instructions (Signed)
We have sent the following medications to your pharmacy for you to pick up at your convenience: Lansoprazole 30 mg daily.  Please purchase the following medications over the counter and take as directed: Colace as needed  We have given you a work note for today.  _______________________________________________________  If your blood pressure at your visit was 140/90 or greater, please contact your primary care physician to follow up on this.  _______________________________________________________  If you are age 65 or older, your body mass index should be between 23-30. Your Body mass index is 22.32 kg/m. If this is out of the aforementioned range listed, please consider follow up with your Primary Care Provider.  If you are age 55 or younger, your body mass index should be between 19-25. Your Body mass index is 22.32 kg/m. If this is out of the aformentioned range listed, please consider follow up with your Primary Care Provider.   ________________________________________________________  The [facility] providers would like to encourage you to use [facility] to communicate with providers for non-urgent requests or questions.  Due to long hold times on the telephone, sending your provider a message by [facility] may be a faster and more efficient way to get a response.  Please allow 48 business hours for a response.  Please remember that this is for non-urgent requests.  _______________________________________________________ Due to recent changes in healthcare laws, you may see the results of your imaging and laboratory studies on MyChart before your provider has had a chance to review them.  We understand that in some cases there may be results that are confusing or concerning to you. Not all laboratory results come back in the same time frame and the provider may be waiting for multiple results in order to interpret others.  Please give [place] 48 hours in order for your provider to thoroughly  review all the results before contacting the office for clarification of your results.

## 2022-11-05 NOTE — Progress Notes (Signed)
   Subjective:    Patient ID: Elizabeth Shaffer, female    DOB: 1995/08/17, 27 y.o.   MRN: [id number]  HPI Elizabeth Shaffer is a 27 year old female with GERD, constipation and now intrauterine pregnancy who is here for follow-up.  She was seen on 07/29/2022 in the office and for upper endoscopy on 07/30/2022.  EGD revealed mild patchy inflammation in the stomach.  Distal esophageal biopsies showed inflammation and reactive change related to reflux despite omeprazole 40 mg daily.  Gastric biopsies were unremarkable.  After EGD I recommended we double her PPI to omeprazole 40 mg twice daily and have her follow-up.  She reports that she did increase the omeprazole to 40 mg twice daily but did not feel that the burning epigastric and esophageal pain had improved.  This was then switched to lansoprazole 30 mg daily and symptoms have improved significantly.  She is also modified her diet and avoiding sodas and also tobacco.  She is not having any dysphagia.  She has tried Colace which has helped with her hard and large stools.  She was previously pregnant but the pregnancy was terminated.  Review of Systems As per HPI, otherwise negative  Current Medications, Allergies, Past Medical History, Past Surgical History, Family History and Social History were reviewed in [employer] record.     Objective:   Physical Exam BP 118/66   Pulse 76   Ht 5' 0.5" (1.537 m)   Wt 116 lb 3.2 oz (52.7 kg)   LMP 07/16/2022   BMI 22.32 kg/m  Gen: awake, alert, NAD HEENT: anicteric  Abd: soft, NT/ND, +BS throughout Ext: no c/c/e Neuro: nonfocal      Assessment & Plan:  26 year old female with GERD, constipation and now intrauterine pregnancy who is here for follow-up.   GERD with mild esophagitis by biopsy --improved when changing omeprazole to lansoprazole -- Continue lansoprazole 30 mg daily  2.  Mild constipation and intermittent hard stool --continue Colace 100 200 mg  daily  Follow-up as needed  20 minutes total spent today including patient facing time, coordination of care, reviewing medical history/procedures/pertinent radiology studies, and documentation of the encounter.

## 2022-11-23 ENCOUNTER — Inpatient Hospital Stay (HOSPITAL_COMMUNITY)
Admission: AD | Admit: 2022-11-23 | Discharge: 2022-11-23 | Disposition: A | Discharge status: Home / Self Care | Payer: Medicaid Other | Attending: Obstetrics and Gynecology | Admitting: Obstetrics and Gynecology

## 2022-11-23 ENCOUNTER — Other Ambulatory Visit: Payer: Self-pay

## 2022-11-23 ENCOUNTER — Encounter (HOSPITAL_COMMUNITY): Payer: Self-pay | Admitting: Obstetrics and Gynecology

## 2022-11-23 DIAGNOSIS — O036 Delayed or excessive hemorrhage following complete or unspecified spontaneous abortion: Secondary | ICD-10-CM | POA: Diagnosis not present

## 2022-11-23 NOTE — MAU Note (Signed)
Called by Shanda Bumps, CNM - not in [place]

## 2022-11-23 NOTE — MAU Note (Signed)
Not in lobby

## 2022-11-23 NOTE — MAU Note (Signed)
Called by Dr Adrian Blackwater- not in lobby

## 2022-11-23 NOTE — MAU Note (Addendum)
Elizabeth Shaffer is a 27 y.o. at [redacted]w[redacted]d here in MAU reporting: she had an abortion in September 10, 2022.  Reports spotted for 2 month after the abortion, spotting stopped.  States had a menstrual cycle on 11/02/2022 lasting 5 days and began bleeding on June 4th.  Reports changing sanitary napkins 3 times per day.  Denies any pain. Reports seen by PCP today, -UPT @ office, instructed to be seen MAU for further evaluation LMP: 11/02/2022 Onset of complaint: 11/17/2022 Pain score: 0 Vitals:   11/23/22 1802  BP: 103/67  Pulse: (!) 52  Resp: 19  Temp: 97.9 F (36.6 C)  SpO2: 100%     FHT:NA Lab orders placed from triage:   None

## 2023-02-25 ENCOUNTER — Ambulatory Visit (HOSPITAL_COMMUNITY)
Admission: EM | Admit: 2023-02-25 | Discharge: 2023-02-25 | Disposition: A | Discharge status: Home / Self Care | Payer: Medicaid Other | Attending: Family Medicine | Admitting: Family Medicine

## 2023-02-25 ENCOUNTER — Encounter (HOSPITAL_COMMUNITY): Payer: Self-pay

## 2023-02-25 DIAGNOSIS — R5383 Other fatigue: Secondary | ICD-10-CM | POA: Insufficient documentation

## 2023-02-25 DIAGNOSIS — Z3201 Encounter for pregnancy test, result positive: Secondary | ICD-10-CM | POA: Diagnosis present

## 2023-02-25 DIAGNOSIS — R519 Headache, unspecified: Secondary | ICD-10-CM | POA: Diagnosis present

## 2023-02-25 DIAGNOSIS — R110 Nausea: Secondary | ICD-10-CM | POA: Insufficient documentation

## 2023-02-25 LAB — POCT URINALYSIS DIP (MANUAL ENTRY)
Bilirubin, UA: NEGATIVE
Glucose, UA: NEGATIVE mg/dL
Ketones, POC UA: NEGATIVE mg/dL
Nitrite, UA: NEGATIVE
Protein Ur, POC: NEGATIVE mg/dL
Spec Grav, UA: >=1.03 — AB (ref 1.010–1.025)
Urobilinogen, UA: 1 U/dL
pH, UA: 6 (ref 5.0–8.0)

## 2023-02-25 LAB — POCT URINE PREGNANCY: Preg Test, Ur: POSITIVE — AB

## 2023-02-25 LAB — HCG, QUANTITATIVE, PREGNANCY: hCG, Beta Chain, Quant, S: 66452 m[IU]/mL — ABNORMAL HIGH (ref <5)

## 2023-02-25 MED ORDER — DOXYLAMINE SUCCINATE (SLEEP) 25 MG PO TABS: 12.5000 mg | ORAL_TABLET | Freq: Four times a day (QID) | ORAL | 0 refills | Status: DC | PRN

## 2023-02-25 MED ORDER — VITAMIN B-6 25 MG PO TABS: 25.0000 mg | ORAL_TABLET | Freq: Three times a day (TID) | ORAL | 0 refills | Status: DC | PRN

## 2023-02-25 NOTE — ED Triage Notes (Signed)
Patient here today with c/o headache, nausea, light sensitivity, vomiting, and weakness since yesterday. She has taken Advil with no relief.

## 2023-02-25 NOTE — ED Provider Notes (Signed)
[facility] CARE CENTER   [id number] 02/25/23 Arrival Time: 1533  ASSESSMENT & PLAN:  1. Other fatigue   2. Acute nonintractable headache, unspecified headache type   3. Positive pregnancy test   4. Nausea    Unsure of LMP. Benign abdomen.  Labs Reviewed  POCT URINE PREGNANCY - Abnormal; Notable for the following components:      Result Value   Preg Test, Ur Positive (*)    All other components within normal limits  POCT URINALYSIS DIP (MANUAL ENTRY) - Abnormal; Notable for the following components:   Clarity, UA cloudy (*)    Spec Grav, UA >=1.030 (*)    Blood, UA trace-intact (*)    Leukocytes, UA Trace (*)    All other components within normal limits  HCG, QUANTITATIVE, PREGNANCY   Ensure adequate PO fluid intake.  New Prescriptions   DOXYLAMINE, SLEEP, (UNISOM) 25 MG TABLET    Take 0.5 tablets (12.5 mg total) by mouth every 6 (six) hours as needed.   PYRIDOXINE (VITAMIN B6) 25 MG TABLET    Take 1 tablet (25 mg total) by mouth 3 (three) times daily as needed.     Follow-up Information     Schedule an appointment as soon as possible for a visit  with Center for [facility] at [facility].   Specialty: Obstetrics and Gynecology Contact information: [address] [place] [address] [phone]        Go to  [facility] ([facility]).   Why: If symptoms worsen in any way. Contact information: [address] [place],  [place]  [address]                Reviewed expectations re: course of current medical issues. Questions answered. Outlined signs and symptoms indicating need for more acute intervention. Understanding verbalized. After Visit Summary given.   SUBJECTIVE: History from: Patient. Elizabeth Shaffer is a 27 y.o. female. Patient here today with c/o headache, nausea, light sensitivity, vomiting, and weakness since yesterday. She has taken Advil with no relief.  Has  been fatigued for a few weeks. Denies: difficulty breathing. Normal PO intake without n/v/d.  OBJECTIVE:  Vitals:   02/25/23 1627  BP: 105/66  Pulse: 83  Resp: 16  Temp: 98.3 F (36.8 C)  TempSrc: Oral  SpO2: 99%  Height: 5' 5.5" (1.664 m)    General appearance: alert; no distress Eyes: PERRLA; EOMI; conjunctiva normal HENT: [place]; AT Neck: supple  Lungs: speaks full sentences without difficulty; unlabored Abd: soft; NT Extremities: no edema Skin: warm and dry Neurologic: normal gait Psychological: alert and cooperative; normal mood and affect  Labs: Results for orders placed or performed during the hospital encounter of 02/25/23  POCT urine pregnancy  Result Value Ref Range   Preg Test, Ur Positive (A) Negative  POC urinalysis dipstick  Result Value Ref Range   Color, UA yellow yellow   Clarity, UA cloudy (A) clear   Glucose, UA negative negative mg/dL   Bilirubin, UA negative negative   Ketones, POC UA negative negative mg/dL   Spec Grav, UA >=2.536 (A) 1.010 - 1.025   Blood, UA trace-intact (A) negative   pH, UA 6.0 5.0 - 8.0   Protein Ur, POC negative negative mg/dL   Urobilinogen, UA 1.0 0.2 or 1.0 E.U./dL   Nitrite, UA Negative Negative   Leukocytes, UA Trace (A) Negative   Labs Reviewed  POCT URINE PREGNANCY - Abnormal; Notable for the following  components:      Result Value   Preg Test, Ur Positive (*)    All other components within normal limits  POCT URINALYSIS DIP (MANUAL ENTRY) - Abnormal; Notable for the following components:   Clarity, UA cloudy (*)    Spec Grav, UA >=1.030 (*)    Blood, UA trace-intact (*)    Leukocytes, UA Trace (*)    All other components within normal limits  HCG, QUANTITATIVE, PREGNANCY    Imaging: No results found.  Allergies  Allergen Reactions   Shellfish Allergy Nausea And Vomiting   Crab (Diagnostic) Nausea And Vomiting   Ibuprofen     Gastritis     Past Medical History:  Diagnosis Date   Anemia    during  pregnancy   Displaced fracture of fifth metacarpal bone of left hand    Gastritis    GERD (gastroesophageal reflux disease)    Headache    HTN (hypertension)    with preg   Migraines    PONV (postoperative nausea and vomiting)    UTI (urinary tract infection)    Social History   Socioeconomic History   Marital status: Single    Spouse name: Not on file   Number of children: 1   Years of education: Not on file   Highest education level: 12th grade  Occupational History   Occupation: Food service    Comment: [name]  Tobacco Use   Smoking status: Never    Passive exposure: Never   Smokeless tobacco: Never  Vaping Use   Vaping status: Never Used  Substance and Sexual Activity   Alcohol use: Not Currently    Comment: social   Drug use: Not Currently    Frequency: 7.0 times per week    Types: Marijuana    Comment: stopped 3/6   Sexual activity: Yes  Other Topics Concern   Not on file  Social History Narrative   Not on file   Social Determinants of Health   Financial Resource Strain: Low Risk  (11/23/2022)   Received from [facility], [facility]   Overall Financial Resource Strain (CARDIA)    Difficulty of Paying Living Expenses: Not hard at all  Food Insecurity: No Food Insecurity (11/23/2022)   Received from [facility], [facility]   Hunger Vital Sign    Worried About Running Out of Food in the Last Year: Never true    Ran Out of Food in the Last Year: Never true  Transportation Needs: No Transportation Needs (11/23/2022)   Received from [employer], [facility]   PRAPARE - Transportation    Lack of Transportation (Medical): No    Lack of Transportation (Non-Medical): No  Physical Activity: Not on file  Stress: Not on file  Social Connections: Unknown (10/12/2021)   Received from [facility], [facility]   Social Network    Social Network: Not on file  Intimate Partner Violence: Unknown (09/18/2021)   Received from [facility], [facility]   HITS    Physically Hurt: Not on file    Insult or Talk Down To: Not on file    Threaten Physical Harm: Not on file    Scream or Curse: Not on file   Family History  Adopted: Yes  Problem Relation Age of Onset   Hypertension Mother    Diabetes Father    Past Surgical History:  Procedure Laterality Date   hand     carpule tunnel   MOUTH SURGERY  2011  MULTIPLE TOOTH EXTRACTIONS     OPEN REDUCTION INTERNAL FIXATION (ORIF) METACARPAL Left 10/31/2019   Procedure: OPEN REDUCTION INTERNAL FIXATION (ORIF) LEFT FIFTH METACARPAL FRACTURE USING EXSOMED NAIL 4.39mm x 40mm;  Surgeon: Dominica Severin, MD;  Location: [facility] OR;  Service: Orthopedics;  Laterality: Left;  60 mins   WISDOM TOOTH EXTRACTION       Mardella Layman, MD 02/25/23 [id number]

## 2023-02-25 NOTE — Discharge Instructions (Addendum)
You have had labs (blood pregnancy level) sent today. We will call you with any significant abnormalities or if there is need to begin or change treatment or pursue further follow up.  You may also review your test results online through MyChart. If you do not have a MyChart account, instructions to sign up should be on your discharge paperwork.

## 2023-02-27 ENCOUNTER — Telehealth: Payer: Self-pay

## 2023-02-27 LAB — URINE CULTURE: Culture: 20000 — AB

## 2023-02-27 MED ORDER — CEPHALEXIN 500 MG PO CAPS: 500.0000 mg | ORAL_CAPSULE | Freq: Four times a day (QID) | ORAL | 0 refills | Status: AC

## 2023-02-27 NOTE — Telephone Encounter (Signed)
Per Helaine Chess, PA-C, "Give Keflex per protocol." Reviewed with patient, verified pharmacy, prescription sent. Pt also requested something for nausea. Discussed with PA. PA stated pt will need to f/u with OB for nausea medication. Will sent pt MyChart message.

## 2023-07-19 ENCOUNTER — Emergency Department (HOSPITAL_COMMUNITY): Payer: No Typology Code available for payment source

## 2023-07-19 ENCOUNTER — Other Ambulatory Visit: Payer: Self-pay

## 2023-07-19 ENCOUNTER — Emergency Department (HOSPITAL_COMMUNITY)
Admission: EM | Admit: 2023-07-19 | Discharge: 2023-07-20 | Disposition: A | Discharge status: Home / Self Care | Payer: No Typology Code available for payment source | Attending: Emergency Medicine | Admitting: Emergency Medicine

## 2023-07-19 ENCOUNTER — Encounter (HOSPITAL_COMMUNITY): Payer: Self-pay

## 2023-07-19 DIAGNOSIS — R0789 Other chest pain: Secondary | ICD-10-CM | POA: Diagnosis not present

## 2023-07-19 DIAGNOSIS — T148XXA Other injury of unspecified body region, initial encounter: Secondary | ICD-10-CM | POA: Insufficient documentation

## 2023-07-19 DIAGNOSIS — Z23 Encounter for immunization: Secondary | ICD-10-CM | POA: Diagnosis not present

## 2023-07-19 DIAGNOSIS — Z3A11 11 weeks gestation of pregnancy: Secondary | ICD-10-CM | POA: Insufficient documentation

## 2023-07-19 DIAGNOSIS — T07XXXA Unspecified multiple injuries, initial encounter: Secondary | ICD-10-CM

## 2023-07-19 DIAGNOSIS — M542 Cervicalgia: Secondary | ICD-10-CM | POA: Insufficient documentation

## 2023-07-19 DIAGNOSIS — Z3490 Encounter for supervision of normal pregnancy, unspecified, unspecified trimester: Secondary | ICD-10-CM

## 2023-07-19 DIAGNOSIS — Y92410 Unspecified street and highway as the place of occurrence of the external cause: Secondary | ICD-10-CM | POA: Insufficient documentation

## 2023-07-19 DIAGNOSIS — O9A211 Injury, poisoning and certain other consequences of external causes complicating pregnancy, first trimester: Secondary | ICD-10-CM | POA: Diagnosis not present

## 2023-07-19 DIAGNOSIS — S60512A Abrasion of left hand, initial encounter: Secondary | ICD-10-CM | POA: Insufficient documentation

## 2023-07-19 DIAGNOSIS — S90812A Abrasion, left foot, initial encounter: Secondary | ICD-10-CM | POA: Diagnosis not present

## 2023-07-19 DIAGNOSIS — S0081XA Abrasion of other part of head, initial encounter: Secondary | ICD-10-CM | POA: Diagnosis not present

## 2023-07-19 DIAGNOSIS — O26891 Other specified pregnancy related conditions, first trimester: Secondary | ICD-10-CM | POA: Diagnosis present

## 2023-07-19 LAB — CBC
HCT: 36.2 % (ref 36.0–46.0)
Hemoglobin: 12.7 g/dL (ref 12.0–15.0)
MCH: 32.5 pg (ref 26.0–34.0)
MCHC: 35.1 g/dL (ref 30.0–36.0)
MCV: 92.6 fL (ref 80.0–100.0)
Platelets: 230 10*3/uL (ref 150–400)
RBC: 3.91 MIL/uL (ref 3.87–5.11)
RDW: 12.2 % (ref 11.5–15.5)
WBC: 8.8 10*3/uL (ref 4.0–10.5)
nRBC: 0 % (ref 0.0–0.2)

## 2023-07-19 LAB — BASIC METABOLIC PANEL
Anion gap: 9 (ref 5–15)
BUN: 8 mg/dL (ref 6–20)
CO2: 22 mmol/L (ref 22–32)
Calcium: 9.3 mg/dL (ref 8.9–10.3)
Chloride: 105 mmol/L (ref 98–111)
Creatinine, Ser: 0.64 mg/dL (ref 0.44–1.00)
GFR, Estimated: >60 mL/min (ref >60)
Glucose, Bld: 86 mg/dL (ref 70–99)
Potassium: 3.8 mmol/L (ref 3.5–5.1)
Sodium: 136 mmol/L (ref 135–145)

## 2023-07-19 LAB — HCG, QUANTITATIVE, PREGNANCY: hCG, Beta Chain, Quant, S: 113903 m[IU]/mL — ABNORMAL HIGH (ref <5)
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
## 2023-07-20 ENCOUNTER — Emergency Department (HOSPITAL_COMMUNITY): Payer: No Typology Code available for payment source

## 2023-07-20 LAB — HEPATIC FUNCTION PANEL
ALT: 13 U/L (ref 0–44)
AST: 15 U/L (ref 15–41)
Albumin: 3.6 g/dL (ref 3.5–5.0)
Alkaline Phosphatase: 44 U/L (ref 38–126)
Bilirubin, Direct: 0.2 mg/dL (ref 0.0–0.2)
Indirect Bilirubin: 0.9 mg/dL (ref 0.3–0.9)
Total Bilirubin: 1.1 mg/dL (ref 0.0–1.2)
Total Protein: 6.6 g/dL (ref 6.5–8.1)

## 2023-07-20 LAB — LIPASE, BLOOD: Lipase: 34 U/L (ref 11–51)

## 2023-07-20 LAB — TROPONIN I (HIGH SENSITIVITY): Troponin I (High Sensitivity): 3 ng/L (ref <18)

## 2023-07-20 MED ORDER — TETANUS-DIPHTH-ACELL PERTUSSIS 5-2.5-18.5 LF-MCG/0.5 IM SUSY
0.5000 mL | PREFILLED_SYRINGE | Freq: Once | INTRAMUSCULAR | Status: AC
  Administered 2023-07-20: 0.5 mL via INTRAMUSCULAR
  Filled 2023-07-20: qty 0.5

## 2023-07-20 MED ORDER — ACETAMINOPHEN 325 MG PO TABS: 650.0000 mg | ORAL_TABLET | Freq: Once | ORAL | Status: DC

## 2023-07-20 NOTE — ED Notes (Signed)
Pt ambulated appropriately with no complaints. Pt refused drinking water.
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
## 2023-07-20 NOTE — Discharge Instructions (Addendum)
 Your testing is reassuring.  No evidence of serious traumatic injury.  Use Tylenol  as needed for pain.  Follow-up with your primary doctor as well as your OB doctor regarding her pregnancy.  Return to the ED if worsening pain, difficulty breathing, chest pain, abdominal pain, vaginal bleeding or other concerns.

## 2023-08-30 ENCOUNTER — Encounter (HOSPITAL_COMMUNITY): Payer: Self-pay

## 2023-08-30 ENCOUNTER — Ambulatory Visit (HOSPITAL_COMMUNITY)
Admission: EM | Admit: 2023-08-30 | Discharge: 2023-08-30 | Disposition: A | Discharge status: Home / Self Care | Attending: Internal Medicine | Admitting: Internal Medicine

## 2023-08-30 DIAGNOSIS — J09X2 Influenza due to identified novel influenza A virus with other respiratory manifestations: Secondary | ICD-10-CM | POA: Diagnosis not present

## 2023-08-30 LAB — POCT INFLUENZA A/B
Influenza A, POC: POSITIVE — AB
Influenza B, POC: NEGATIVE

## 2023-08-30 MED ORDER — BENZONATATE 200 MG PO CAPS: 200.0000 mg | ORAL_CAPSULE | Freq: Three times a day (TID) | ORAL | 0 refills | Status: DC | PRN

## 2023-08-30 MED ORDER — ACETAMINOPHEN 325 MG PO TABS
975.0000 mg | ORAL_TABLET | Freq: Once | ORAL | Status: AC
  Administered 2023-08-30: 975 mg via ORAL

## 2023-08-30 MED ORDER — OSELTAMIVIR PHOSPHATE 75 MG PO CAPS: 75.0000 mg | ORAL_CAPSULE | Freq: Two times a day (BID) | ORAL | 0 refills | Status: DC

## 2023-08-30 MED ORDER — PSEUDOEPHEDRINE HCL ER 120 MG PO TB12: 120.0000 mg | ORAL_TABLET | Freq: Two times a day (BID) | ORAL | 0 refills | Status: DC

## 2023-08-30 MED ORDER — ACETAMINOPHEN 325 MG PO TABS
ORAL_TABLET | ORAL | Status: AC
  Filled 2023-08-30: qty 3

## 2023-08-30 NOTE — ED Triage Notes (Signed)
 Patient here today with c/o cough, fever, fatigue, weakness, chills, sweats, nasal congestion, and headache since Friday. Patient has been taking Theraflu and Tylenol with no relief. Last dose of Tylenol was at 10 pm last night.

## 2023-08-30 NOTE — ED Provider Notes (Signed)
 [facility]    CSN: [id number] Arrival date & time: 08/30/23  0807      History   Chief Complaint Chief Complaint  Patient presents with   Cough    HPI Elizabeth Shaffer is a 28 y.o. female who presents with onset of HA 3 days ato, then the next day developed cough, fatigue, subjective fever , chills, body aches, nose congestion  Has been taking OTC meds with no relief. Her last dose of Tylenol was last night at 10 pm. Her daughter tested positive for Flu A this am. Denies GI symptoms    Past Medical History:  Diagnosis Date   Anemia    during pregnancy   Displaced fracture of fifth metacarpal bone of left hand    Gastritis    GERD (gastroesophageal reflux disease)    Headache    HTN (hypertension)    with preg   Migraines    PONV (postoperative nausea and vomiting)    UTI (urinary tract infection)     Patient Active Problem List   Diagnosis Date Noted   Asymptomatic bacteriuria during pregnancy 09/10/2022   Incomplete legal abortion 03/12/2021   Closed fracture of fifth metacarpal bone 10/26/2019   Migraine with aura and without status migrainosus, not intractable 03/15/2019   History of gestational hypertension 01/09/2019   GERD (gastroesophageal reflux disease) 08/19/2017   Seasonal allergies 10/25/2012    Past Surgical History:  Procedure Laterality Date   hand     carpule tunnel   MOUTH SURGERY  2011   MULTIPLE TOOTH EXTRACTIONS     OPEN REDUCTION INTERNAL FIXATION (ORIF) METACARPAL Left 10/31/2019   Procedure: OPEN REDUCTION INTERNAL FIXATION (ORIF) LEFT FIFTH METACARPAL FRACTURE USING EXSOMED NAIL 4.5mm x 40mm;  Surgeon: Dominica Severin, MD;  Location: [facility];  Service: Orthopedics;  Laterality: Left;  60 mins   WISDOM TOOTH EXTRACTION      OB History     Gravida  5   Para  1   Term  1   Preterm  0   AB  2   Living  1      SAB  1   IAB  1   Ectopic  0   Multiple  0   Live Births  1            Home Medications     Prior to Admission medications   Medication Sig Start Date End Date Taking? Authorizing Provider  benzonatate (TESSALON) 200 MG capsule Take 1 capsule (200 mg total) by mouth 3 (three) times daily as needed for cough. 08/30/23  Yes Rodriguez-Southworth, Nettie Elm, PA-C  oseltamivir (TAMIFLU) 75 MG capsule Take 1 capsule (75 mg total) by mouth every 12 (twelve) hours. 08/30/23  Yes Rodriguez-Southworth, Nettie Elm, PA-C  pseudoephedrine (SUDAFED) 120 MG 12 hr tablet Take 1 tablet (120 mg total) by mouth 2 (two) times daily. 08/30/23  Yes Rodriguez-Southworth, Nettie Elm, PA-C  lansoprazole (PREVACID) 30 MG capsule Take 1 capsule (30 mg total) by mouth daily at 12 noon. 11/05/22   Pyrtle, Carie Caddy, MD    Family History Family History  Adopted: Yes  Problem Relation Age of Onset   Hypertension Mother    Diabetes Father     Social History Social History   Tobacco Use   Smoking status: Never    Passive exposure: Never   Smokeless tobacco: Never  Vaping Use   Vaping status: Never Used  Substance Use Topics   Alcohol use: Not Currently    Comment:  social   Drug use: Not Currently    Frequency: 7.0 times per week    Types: Marijuana    Comment: stopped 3/6     Allergies   Shellfish allergy, Crab (diagnostic), and Ibuprofen   Review of Systems Review of Systems As noted in HPI  Physical Exam Triage Vital Signs ED Triage Vitals  Encounter Vitals Group     BP 08/30/23 0828 123/80     Systolic BP Percentile --      Diastolic BP Percentile --      Pulse Rate 08/30/23 0828 (!) 116     Resp 08/30/23 0828 16     Temp 08/30/23 0828 99.9 F (37.7 C)     Temp Source 08/30/23 0828 Oral     SpO2 08/30/23 0828 96 %     Weight 08/30/23 0829 112 lb (50.8 kg)     Height 08/30/23 0829 5' 0.5" (1.537 m)     Head Circumference --      Peak Flow --      Pain Score 08/30/23 0829 0     Pain Loc --      Pain Education --      Exclude from Growth Chart --    No data found.  Updated Vital  Signs BP 123/80 (BP Location: Left Arm)   Pulse (!) 116   Temp 99.9 F (37.7 C) (Oral)   Resp 16   Ht 5' 0.5" (1.537 m)   Wt 112 lb (50.8 kg)   LMP 08/28/2023 (Exact Date)   SpO2 96%   BMI 21.51 kg/m   Visual Acuity Right Eye Distance:   Left Eye Distance:   Bilateral Distance:    Right Eye Near:   Left Eye Near:    Bilateral Near:     Physical Exam Physical Exam Vitals signs and nursing note reviewed.  Constitutional:      General: She is not in acute distress.    Appearance: Normal appearance. She is ill-appearing,but  toxic-appearing or diaphoretic.  HENT:     Head: Normocephalic.     Right Ear: Tympanic membrane, ear canal and external ear normal.     Left Ear: Tympanic membrane, ear canal and external ear normal.     Nose: with clear rhinitis      Mouth/Throat: clear    Mouth: Mucous membranes are moist.  Eyes:     General: No scleral icterus.       Right eye: No discharge.        Left eye: No discharge.     Conjunctiva/sclera: Conjunctivae normal.  Neck:     Musculoskeletal: Neck supple. No neck rigidity.  Cardiovascular:     Rate and Rhythm: Normal rate and regular rhythm.     Heart sounds: No murmur.  Pulmonary:     Effort: Pulmonary effort is normal.     Breath sounds: Normal breath sounds.  Abdominal:     General: Bowel sounds are normal. There is no distension.     Palpations: Abdomen is soft. There is no mass.     Tenderness: There is no abdominal tenderness. There is no guarding or rebound.     Hernia: No hernia is present.  Musculoskeletal: Normal range of motion.  Lymphadenopathy:     Cervical: No cervical adenopathy.  Skin:    General: Skin is warm and dry.     Coloration: Skin is not jaundiced.     Findings: No rash.  Neurological:     Mental Status: She  is alert and oriented to person, place, and time.     Gait: Gait normal.  Psychiatric:        Mood and Affect: Mood normal.        Behavior: Behavior normal.        Thought Content:  Thought content normal.        Judgment: Judgment normal.    UC Treatments / Results  Labs (all labs ordered are listed, but only abnormal results are displayed) Labs Reviewed  POCT INFLUENZA A/B - Abnormal; Notable for the following components:      Result Value   Influenza A, POC Positive (*)    All other components within normal limits  Flu A positive, Flu B negative  EKG   Radiology No results found.  Procedures Procedures (including critical care time)  Medications Ordered in UC Medications  acetaminophen (TYLENOL) tablet 975 mg (975 mg Oral Given 08/30/23 0909)    Initial Impression / Assessment and Plan / UC Course  I have reviewed the triage vital signs and the nursing notes.  Pertinent labs  results that were available during my care of the patient were reviewed by me and considered in my medical decision making (see chart for details).  Influenza A  I placed her on Tamiflu, Tessalon and Sudafed as noted.    Final Clinical Impressions(s) / UC Diagnoses   Final diagnoses:  Influenza due to identified novel influenza A virus with other respiratory manifestations   Discharge Instructions   None    ED Prescriptions     Medication Sig Dispense Auth. Provider   pseudoephedrine (SUDAFED) 120 MG 12 hr tablet Take 1 tablet (120 mg total) by mouth 2 (two) times daily. 20 tablet Rodriguez-Southworth, Nettie Elm, PA-C   benzonatate (TESSALON) 200 MG capsule Take 1 capsule (200 mg total) by mouth 3 (three) times daily as needed for cough. 30 capsule Rodriguez-Southworth, Nettie Elm, PA-C   oseltamivir (TAMIFLU) 75 MG capsule Take 1 capsule (75 mg total) by mouth every 12 (twelve) hours. 10 capsule Rodriguez-Southworth, Nettie Elm, PA-C      PDMP not reviewed this encounter.   Garey Ham, [place] 08/30/23 [phone]

## 2023-10-05 ENCOUNTER — Encounter (HOSPITAL_COMMUNITY): Payer: Self-pay | Admitting: *Deleted

## 2023-10-05 ENCOUNTER — Ambulatory Visit (HOSPITAL_COMMUNITY)
Admission: EM | Admit: 2023-10-05 | Discharge: 2023-10-05 | Disposition: A | Discharge status: Home / Self Care | Attending: Neurology | Admitting: Neurology

## 2023-10-05 DIAGNOSIS — H01001 Unspecified blepharitis right upper eyelid: Secondary | ICD-10-CM

## 2023-10-05 MED ORDER — ERYTHROMYCIN 5 MG/GM OP OINT: TOPICAL_OINTMENT | OPHTHALMIC | 0 refills | Status: DC

## 2023-10-05 NOTE — Discharge Instructions (Addendum)
 You have an infection of your eye lid - Use antibiotic eye medication sent to pharmacy as directed.  - Change your pillowcase after 2 to 3 days to avoid reinfection. Wash your make up brushes and buy new mascara and eye liner. Avoid false eye lashes until symptoms improve   - You may take Tylenol  every 6 hours as needed for any pain you may have.  - Avoid scratching your eye. Perform warm compresses to your eye before applying the eye medication.  Follow-up with eye doctor as needed for new or worsening symptoms.  If you develop any new or worsening symptoms or do not improve in the next 2 to 3 days, please return.  If your symptoms are severe, please go to the emergency room.  Follow-up with your primary care provider for further evaluation and management of your symptoms as well as ongoing wellness visits.  I hope you feel better!

## 2023-10-05 NOTE — ED Provider Notes (Signed)
 [facility]    CSN: [id number] Arrival date & time: 10/05/23  1924      History   Chief Complaint Chief Complaint  Patient presents with   Eye Problem    HPI Elizabeth Shaffer is a 28 y.o. female.   With a past medical history of anemia, GERD, headache, hypertension in pregnancy, migraines and UTI.  She presents with swelling in the right eyelid.  She states that Sunday night pain started, she felt like something got in her eye and rubbed it and then the swelling got worse yesterday through today.  She is having pain with palpation of her eye.  She is not having any pain in her jaw or swelling in her cheek or forehead.  She does wear false eyelashes and uses make-up daily.  She additionally she works for [employer] and is in a dusty environment.  She does say that dust or dirt could have gone into her eye while she was at work.  The history is provided by the patient.  Eye Problem Location:  Right eye Quality:  Sharp Severity:  Moderate Onset quality:  Gradual Duration:  2 days Progression:  Worsening Chronicity:  New Relieved by:  Nothing Ineffective treatments:  Eye drops and NSAIDs Associated symptoms: swelling   Risk factors: no conjunctival hemorrhage, no exposure to pinkeye, no previous injury to eye, no recent herpes zoster and no recent URI     Past Medical History:  Diagnosis Date   Anemia    during pregnancy   Displaced fracture of fifth metacarpal bone of left hand    Gastritis    GERD (gastroesophageal reflux disease)    Headache    HTN (hypertension)    with preg   Migraines    PONV (postoperative nausea and vomiting)    UTI (urinary tract infection)     Patient Active Problem List   Diagnosis Date Noted   Asymptomatic bacteriuria during pregnancy 09/10/2022   Incomplete legal abortion 03/12/2021   Closed fracture of fifth metacarpal bone 10/26/2019   Migraine with aura and without status migrainosus, not intractable 03/15/2019   History of  gestational hypertension 01/09/2019   GERD (gastroesophageal reflux disease) 08/19/2017   Seasonal allergies 10/25/2012    Past Surgical History:  Procedure Laterality Date   hand     carpule tunnel   MOUTH SURGERY  2011   MULTIPLE TOOTH EXTRACTIONS     OPEN REDUCTION INTERNAL FIXATION (ORIF) METACARPAL Left 10/31/2019   Procedure: OPEN REDUCTION INTERNAL FIXATION (ORIF) LEFT FIFTH METACARPAL FRACTURE USING EXSOMED NAIL 4.30mm x 40mm;  Surgeon: Ronn Cohn, MD;  Location: [facility];  Service: Orthopedics;  Laterality: Left;  60 mins   WISDOM TOOTH EXTRACTION      OB History     Gravida  5   Para  1   Term  1   Preterm  0   AB  2   Living  1      SAB  1   IAB  1   Ectopic  0   Multiple  0   Live Births  1            Home Medications    Prior to Admission medications   Medication Sig Start Date End Date Taking? Authorizing Provider  erythromycin  ophthalmic ointment Place a 1/2 inch ribbon of ointment into the lower eyelid. 10/05/23  Yes Imogene Mana, NP  lansoprazole  (PREVACID ) 30 MG capsule Take 1 capsule (30 mg total) by mouth daily at  12 noon. 11/05/22  Yes Pyrtle, Amber Bail, MD  benzonatate  (TESSALON ) 200 MG capsule Take 1 capsule (200 mg total) by mouth 3 (three) times daily as needed for cough. 08/30/23   Rodriguez-Southworth, Sylvia, PA-C  oseltamivir  (TAMIFLU ) 75 MG capsule Take 1 capsule (75 mg total) by mouth every 12 (twelve) hours. 08/30/23   Rodriguez-Southworth, Sylvia, PA-C  pseudoephedrine  (SUDAFED) 120 MG 12 hr tablet Take 1 tablet (120 mg total) by mouth 2 (two) times daily. 08/30/23   Rodriguez-Southworth, Lamond Pilot, PA-C    Family History Family History  Adopted: Yes  Problem Relation Age of Onset   Hypertension Mother    Diabetes Father     Social History Social History   Tobacco Use   Smoking status: Never    Passive exposure: Never   Smokeless tobacco: Never  Vaping Use   Vaping status: Never Used  Substance Use Topics   Alcohol  use: Not Currently    Comment: social   Drug use: Not Currently    Frequency: 7.0 times per week    Types: Marijuana    Comment: stopped 3/6     Allergies   Shellfish allergy, Crab (diagnostic), and Ibuprofen    Review of Systems Review of Systems   Physical Exam Triage Vital Signs ED Triage Vitals  Encounter Vitals Group     BP 10/05/23 1940 103/68     Systolic BP Percentile --      Diastolic BP Percentile --      Pulse Rate 10/05/23 1940 62     Resp 10/05/23 1940 16     Temp 10/05/23 1940 98.1 F (36.7 C)     Temp Source 10/05/23 1940 Oral     SpO2 10/05/23 1940 97 %     Weight --      Height --      Head Circumference --      Peak Flow --      Pain Score 10/05/23 1938 9     Pain Loc --      Pain Education --      Exclude from Growth Chart --    No data found.  Updated Vital Signs BP 103/68 (BP Location: Left Arm)   Pulse 62   Temp 98.1 F (36.7 C) (Oral)   Resp 16   LMP 09/10/2023 (Approximate)   SpO2 97%   Visual Acuity Right Eye Distance:   Left Eye Distance:   Bilateral Distance:    Right Eye Near:   Left Eye Near:    Bilateral Near:     Physical Exam Vitals and nursing note reviewed.  Constitutional:      General: She is not in acute distress.    Appearance: She is well-developed.  HENT:     Head: Normocephalic and atraumatic.  Eyes:     General: Vision grossly intact.     Conjunctiva/sclera: Conjunctivae normal.     Comments: Swelling to right eyelid  Cardiovascular:     Rate and Rhythm: Normal rate and regular rhythm.     Heart sounds: No murmur heard. Pulmonary:     Effort: Pulmonary effort is normal. No respiratory distress.     Breath sounds: Normal breath sounds.  Abdominal:     Palpations: Abdomen is soft.     Tenderness: There is no abdominal tenderness.  Musculoskeletal:        General: No swelling.     Cervical back: Neck supple.  Skin:    General: Skin is warm and dry.  Capillary Refill: Capillary refill takes  less than 2 seconds.  Neurological:     Mental Status: She is alert.  Psychiatric:        Mood and Affect: Mood normal.      UC Treatments / Results  Labs (all labs ordered are listed, but only abnormal results are displayed) Labs Reviewed - No data to display  EKG   Radiology No results found.  Procedures Procedures (including critical care time)  Medications Ordered in UC Medications - No data to display  Initial Impression / Assessment and Plan / UC Course  I have reviewed the triage vital signs and the nursing notes.  Pertinent labs & imaging results that were available during my care of the patient were reviewed by me and considered in my medical decision making (see chart for details).   Exam consistent with blepharitis of the right eyelid.  Patient denies recent trauma to the eye however she does have risk factors with make-up usage and false eyelashes as well as working in a [employer].  Plan for antibiotic ointment and return in 2 to 3 days if her symptoms are not improving for oral antibiotic treatment.  Patient is in agreement with the plan of care.   Final Clinical Impressions(s) / UC Diagnoses   Final diagnoses:  Blepharitis of right upper eyelid, unspecified type     Discharge Instructions      You have an infection of your eye lid - Use antibiotic eye medication sent to pharmacy as directed.  - Change your pillowcase after 2 to 3 days to avoid reinfection. Wash your make up brushes and buy new mascara and eye liner. Avoid false eye lashes until symptoms improve   - You may take Tylenol  every 6 hours as needed for any pain you may have.  - Avoid scratching your eye. Perform warm compresses to your eye before applying the eye medication.  Follow-up with eye doctor as needed for new or worsening symptoms.  If you develop any new or worsening symptoms or do not improve in the next 2 to 3 days, please return.  If your symptoms are severe, please go  to the emergency room.  Follow-up with your primary care provider for further evaluation and management of your symptoms as well as ongoing wellness visits.  I hope you feel better!          ED Prescriptions     Medication Sig Dispense Auth. Provider   erythromycin  ophthalmic ointment Place a 1/2 inch ribbon of ointment into the lower eyelid. 3.5 g Imogene Mana, NP      PDMP not reviewed this encounter.   Imogene Mana, NP 10/05/23 2019

## 2023-10-05 NOTE — ED Triage Notes (Signed)
 Pt states she has right eye pain and swelling X 2 days. She has been using taking tylenol  and using clear eyes.

## 2024-01-16 ENCOUNTER — Encounter: Payer: Self-pay | Admitting: Emergency Medicine

## 2024-01-16 ENCOUNTER — Ambulatory Visit
Admission: EM | Admit: 2024-01-16 | Discharge: 2024-01-16 | Disposition: A | Discharge status: Home / Self Care | Attending: Physician Assistant | Admitting: Physician Assistant

## 2024-01-16 DIAGNOSIS — R300 Dysuria: Secondary | ICD-10-CM

## 2024-01-16 DIAGNOSIS — N898 Other specified noninflammatory disorders of vagina: Secondary | ICD-10-CM | POA: Diagnosis present

## 2024-01-16 LAB — POCT URINE DIPSTICK
Bilirubin, UA: NEGATIVE
Blood, UA: NEGATIVE
Glucose, UA: NEGATIVE mg/dL
Nitrite, UA: NEGATIVE
POC PROTEIN,UA: NEGATIVE
Spec Grav, UA: >=1.03 — AB (ref 1.010–1.025)
Urobilinogen, UA: 2 U/dL — AB
pH, UA: 6 (ref 5.0–8.0)

## 2024-01-16 LAB — POCT URINE PREGNANCY: Preg Test, Ur: NEGATIVE

## 2024-01-16 MED ORDER — NITROFURANTOIN MONOHYD MACRO 100 MG PO CAPS
100.0000 mg | ORAL_CAPSULE | Freq: Two times a day (BID) | ORAL | 0 refills | Status: DC
Start: 2024-01-16 — End: 2024-04-05

## 2024-01-16 NOTE — ED Triage Notes (Signed)
 Pt st's she has had a tinging sensation in her vagina x's one week   Also st's she she wipes after urinating she has burning  Pt denies vaginal discharge or other symptoms

## 2024-01-16 NOTE — ED Provider Notes (Signed)
 [facility]    CSN: [id number] Arrival date & time: 01/16/24  1122      History   Chief Complaint Chief Complaint  Patient presents with   Vaginal irritation    HPI Elizabeth Shaffer is a 28 y.o. female.   Patient here today for evaluation of a tingling sensation in her vagina that started about a week ago.  She reports that this started after she used a different type of condom and is not sure if this is related.  She states that when she wipes after urinating she has burning.  She does not necessarily have burning with peeing.  She denies any vaginal discharge or rash.  She is not concerned with STDs. She reports area of discomfort feels dry  The history is provided by the patient.    Past Medical History:  Diagnosis Date   Anemia    during pregnancy   Displaced fracture of fifth metacarpal bone of left hand    Gastritis    GERD (gastroesophageal reflux disease)    Headache    HTN (hypertension)    with preg   Migraines    PONV (postoperative nausea and vomiting)    UTI (urinary tract infection)     Patient Active Problem List   Diagnosis Date Noted   Asymptomatic bacteriuria during pregnancy 09/10/2022   Incomplete legal abortion 03/12/2021   Closed fracture of fifth metacarpal bone 10/26/2019   Displaced fracture of shaft of fifth metacarpal bone, left hand, initial encounter for closed fracture 10/26/2019   Migraine with aura and without status migrainosus, not intractable 03/15/2019   History of gestational hypertension 01/09/2019   GERD (gastroesophageal reflux disease) 08/19/2017   Seasonal allergies 10/25/2012    Past Surgical History:  Procedure Laterality Date   hand     carpule tunnel   MOUTH SURGERY  2011   MULTIPLE TOOTH EXTRACTIONS     OPEN REDUCTION INTERNAL FIXATION (ORIF) METACARPAL Left 10/31/2019   Procedure: OPEN REDUCTION INTERNAL FIXATION (ORIF) LEFT FIFTH METACARPAL FRACTURE USING EXSOMED NAIL 4.41mm x 40mm;  Surgeon: Camella Fallow, MD;  Location: [facility] OR;  Service: Orthopedics;  Laterality: Left;  60 mins   WISDOM TOOTH EXTRACTION      OB History     Gravida  5   Para  1   Term  1   Preterm  0   AB  2   Living  1      SAB  1   IAB  1   Ectopic  0   Multiple  0   Live Births  1            Home Medications    Prior to Admission medications   Medication Sig Start Date End Date Taking? Authorizing Provider  albuterol (VENTOLIN HFA) 108 (90 Base) MCG/ACT inhaler Inhale 2 puffs into the lungs every 6 (six) hours as needed for wheezing or shortness of breath. 06/03/23  Yes [provider]  nitrofurantoin , macrocrystal-monohydrate, (MACROBID ) 100 MG capsule Take 1 capsule (100 mg total) by mouth 2 (two) times daily. 01/16/24  Yes Billy Asberry FALCON, PA-C  benzonatate  (TESSALON ) 200 MG capsule Take 1 capsule (200 mg total) by mouth 3 (three) times daily as needed for cough. Patient not taking: Reported on 01/16/2024 08/30/23   Rodriguez-Southworth, Kyra, PA-C  erythromycin  ophthalmic ointment Place a 1/2 inch ribbon of ointment into the lower eyelid. Patient not taking: Reported on 01/16/2024 10/05/23   Remi Pippin, NP  fexofenadine-pseudoephedrine  (ALLEGRA-D)  60-120 MG 12 hr tablet Take 1 tablet by mouth 2 (two) times daily. Patient not taking: Reported on 01/16/2024 06/03/23 06/02/24  [provider]  fluticasone (FLONASE) 50 MCG/ACT nasal spray Place 1 spray into both nostrils daily. Patient not taking: Reported on 01/16/2024 06/03/23   [provider]  lansoprazole  (PREVACID ) 30 MG capsule Take 1 capsule (30 mg total) by mouth daily at 12 noon. 11/05/22   Pyrtle, Gordy HERO, MD  norgestimate -ethinyl estradiol  (MILI) 0.25-35 MG-MCG tablet Take 1 tablet by mouth daily.    [provider]  norgestimate -ethinyl estradiol  (MILI) 0.25-35 MG-MCG tablet Take 1 tablet by mouth daily.    [provider]  oseltamivir  (TAMIFLU ) 75 MG capsule Take 1 capsule (75 mg total) by  mouth every 12 (twelve) hours. Patient not taking: Reported on 01/16/2024 08/30/23   Rodriguez-Southworth, Sylvia, PA-C  predniSONE (DELTASONE) 20 MG tablet Take 20 mg by mouth as directed. Patient not taking: Reported on 01/16/2024 06/03/23   [provider]  pseudoephedrine  (SUDAFED) 120 MG 12 hr tablet Take 1 tablet (120 mg total) by mouth 2 (two) times daily. Patient not taking: Reported on 01/16/2024 08/30/23   Rodriguez-Southworth, Kyra, PA-C    Family History Family History  Adopted: Yes  Problem Relation Age of Onset   Hypertension Mother    Diabetes Father     Social History Social History   Tobacco Use   Smoking status: Never    Passive exposure: Never   Smokeless tobacco: Never  Vaping Use   Vaping status: Some Days  Substance Use Topics   Alcohol use: Yes    Comment: social   Drug use: Not Currently    Frequency: 7.0 times per week    Types: Marijuana    Comment: stopped 3/6     Allergies   Shellfish allergy, Crab (diagnostic), and Ibuprofen    Review of Systems Review of Systems  Constitutional:  Negative for chills and fever.  Eyes:  Negative for discharge and redness.  Respiratory:  Negative for shortness of breath.   Gastrointestinal:  Negative for abdominal pain, nausea and vomiting.  Genitourinary:  Negative for genital sores and vaginal discharge.     Physical Exam Triage Vital Signs ED Triage Vitals  Encounter Vitals Group     BP 01/16/24 1135 96/65     Girls Systolic BP Percentile --      Girls Diastolic BP Percentile --      Boys Systolic BP Percentile --      Boys Diastolic BP Percentile --      Pulse Rate 01/16/24 1135 (!) 101     Resp 01/16/24 1135 16     Temp 01/16/24 1135 97.6 F (36.4 C)     Temp Source 01/16/24 1135 Oral     SpO2 01/16/24 1135 96 %     Weight --      Height --      Head Circumference --      Peak Flow --      Pain Score 01/16/24 1137 5     Pain Loc --      Pain Education --      Exclude from Growth  Chart --    No data found.  Updated Vital Signs BP 96/65 (BP Location: Left Arm)   Pulse (!) 101   Temp 97.6 F (36.4 C) (Oral)   Resp 16   LMP 12/30/2023 (Exact Date)   SpO2 96%   Visual Acuity Right Eye Distance:   Left Eye  Distance:   Bilateral Distance:    Right Eye Near:   Left Eye Near:    Bilateral Near:     Physical Exam Vitals and nursing note reviewed.  Constitutional:      General: She is not in acute distress.    Appearance: Normal appearance. She is not ill-appearing.  HENT:     Head: Normocephalic and atraumatic.  Eyes:     Conjunctiva/sclera: Conjunctivae normal.  Cardiovascular:     Rate and Rhythm: Normal rate.  Pulmonary:     Effort: Pulmonary effort is normal. No respiratory distress.  Genitourinary:    Comments: Chaperone: Luke Haddock, RN  External genitalia WNL with possible mild swelling around vaginal meatus, no erythema or rashes Neurological:     Mental Status: She is alert.  Psychiatric:        Mood and Affect: Mood normal.        Behavior: Behavior normal.        Thought Content: Thought content normal.      UC Treatments / Results  Labs (all labs ordered are listed, but only abnormal results are displayed) Labs Reviewed  POCT URINE DIPSTICK - Abnormal; Notable for the following components:      Result Value   Clarity, UA cloudy (*)    Ketones, POC UA trace (5) (*)    Spec Grav, UA >=1.030 (*)    Urobilinogen, UA 2.0 (*)    Leukocytes, UA Small (1+) (*)    All other components within normal limits  POCT URINE PREGNANCY - Normal  URINE CULTURE  CERVICOVAGINAL ANCILLARY ONLY    EKG   Radiology No results found.  Procedures Procedures (including critical care time)  Medications Ordered in UC Medications - No data to display  Initial Impression / Assessment and Plan / UC Course  I have reviewed the triage vital signs and the nursing notes.  Pertinent labs & imaging results that were available during my care of the  patient were reviewed by me and considered in my medical decision making (see chart for details).   Macrobid  prescribed given UA results.  It is possible that these were color interference from medication patient has been taking.  STD screening ordered.  Urine culture ordered as well. Advised Vaseline in the area of discomfort given reported dryness.  Recommend follow-up if no gradual improvement or with any further concerns.   Final Clinical Impressions(s) / UC Diagnoses   Final diagnoses:  Dysuria  Vaginal irritation   Discharge Instructions   None    ED Prescriptions     Medication Sig Dispense Auth. Provider   nitrofurantoin , macrocrystal-monohydrate, (MACROBID ) 100 MG capsule Take 1 capsule (100 mg total) by mouth 2 (two) times daily. 10 capsule Billy Asberry FALCON, PA-C      PDMP not reviewed this encounter.   Billy Asberry FALCON, PA-C 01/16/24 [phone]

## 2024-01-17 ENCOUNTER — Ambulatory Visit (HOSPITAL_COMMUNITY): Payer: Self-pay

## 2024-01-17 LAB — URINE CULTURE: Culture: <10000 — AB

## 2024-01-17 LAB — CERVICOVAGINAL ANCILLARY ONLY
Chlamydia: NEGATIVE
Comment: NEGATIVE
Comment: NEGATIVE
Comment: NORMAL
Neisseria Gonorrhea: NEGATIVE
Trichomonas: POSITIVE — AB

## 2024-01-17 MED ORDER — METRONIDAZOLE 500 MG PO TABS
500.0000 mg | ORAL_TABLET | Freq: Two times a day (BID) | ORAL | 0 refills | Status: AC
Start: 2024-01-17 — End: 2024-01-24

## 2024-04-05 ENCOUNTER — Encounter: Payer: Self-pay | Admitting: Emergency Medicine

## 2024-04-05 ENCOUNTER — Ambulatory Visit
Admission: EM | Admit: 2024-04-05 | Discharge: 2024-04-05 | Disposition: A | Discharge status: Home / Self Care | Attending: Family Medicine | Admitting: Family Medicine

## 2024-04-05 DIAGNOSIS — J029 Acute pharyngitis, unspecified: Secondary | ICD-10-CM

## 2024-04-05 DIAGNOSIS — H60501 Unspecified acute noninfective otitis externa, right ear: Secondary | ICD-10-CM | POA: Insufficient documentation

## 2024-04-05 LAB — POCT RAPID STREP A (OFFICE): Rapid Strep A Screen: NEGATIVE

## 2024-04-05 MED ORDER — OFLOXACIN 0.3 % OT SOLN
5.0000 [drp] | Freq: Two times a day (BID) | OTIC | 0 refills | Status: AC
Start: 2024-04-05 — End: 2024-04-12

## 2024-04-05 NOTE — ED Triage Notes (Signed)
 Pt reports bilateral ear pain and sore throat that started yesterday. Denies fevers, nasal congestion, and cough. Pt reports the R-side of her throat hurts when she swallows anything. Reports dull, aching ear pain with intermittent sharp pains in both ears - worse in R ear. Dried blood noted in R ear canal. Reports digging with her nails and q-tips due to itchiness. Pt is prone to seasonal allergies around this time of yr. No med use for symptoms.

## 2024-04-05 NOTE — Discharge Instructions (Signed)
 Your strep test is negative.  Culture of the throat will be sent, and staff will notify you if that is in turn positive.  -Floxin eardrops-5 drops in both ears 2 times daily for 7 days.  Take Tylenol  as needed for pain.  For your sore throat you can also use Cepacol spray.  Make sure you are getting enough fluids in

## 2024-04-05 NOTE — ED Provider Notes (Signed)
 [facility]    CSN: [id number] Arrival date & time: 04/05/24  0806      History   Chief Complaint Chief Complaint  Patient presents with   Otalgia   Sore Throat    HPI Elizabeth Shaffer is a 28 y.o. female.    Otalgia Sore Throat  Here for sore throat on the right side of her throat and bilateral ear pain, though the right ear hurts more.  Symptoms began yesterday.  She has not had any fever or chills or nasal congestion or cough.  Her ears have been itching for a long time, and she does sometimes scratch with her fingernails.  She is not taking medication so far for her symptoms.  Ibuprofen  causes stomach upset.  Last menstrual cycle was October 2   Past Medical History:  Diagnosis Date   Anemia    during pregnancy   Displaced fracture of fifth metacarpal bone of left hand    Gastritis    GERD (gastroesophageal reflux disease)    Headache    HTN (hypertension)    with preg   Migraines    PONV (postoperative nausea and vomiting)    UTI (urinary tract infection)     Patient Active Problem List   Diagnosis Date Noted   Asymptomatic bacteriuria during pregnancy 09/10/2022   Incomplete legal abortion 03/12/2021   Closed fracture of fifth metacarpal bone 10/26/2019   Displaced fracture of shaft of fifth metacarpal bone, left hand, initial encounter for closed fracture 10/26/2019   Migraine with aura and without status migrainosus, not intractable 03/15/2019   History of gestational hypertension 01/09/2019   GERD (gastroesophageal reflux disease) 08/19/2017   Seasonal allergies 10/25/2012    Past Surgical History:  Procedure Laterality Date   hand     carpule tunnel   MOUTH SURGERY  2011   MULTIPLE TOOTH EXTRACTIONS     OPEN REDUCTION INTERNAL FIXATION (ORIF) METACARPAL Left 10/31/2019   Procedure: OPEN REDUCTION INTERNAL FIXATION (ORIF) LEFT FIFTH METACARPAL FRACTURE USING EXSOMED NAIL 4.83mm x 40mm;  Surgeon: Camella Fallow, MD;  Location: [facility]  OR;  Service: Orthopedics;  Laterality: Left;  60 mins   WISDOM TOOTH EXTRACTION      OB History     Gravida  5   Para  1   Term  1   Preterm  0   AB  2   Living  1      SAB  1   IAB  1   Ectopic  0   Multiple  0   Live Births  1            Home Medications    Prior to Admission medications   Medication Sig Start Date End Date Taking? Authorizing Provider  lansoprazole  (PREVACID ) 30 MG capsule Take 1 capsule (30 mg total) by mouth daily at 12 noon. 11/05/22  Yes Pyrtle, Gordy HERO, MD  ofloxacin (FLOXIN) 0.3 % OTIC solution Place 5 drops into both ears 2 (two) times daily for 7 days. 04/05/24 04/12/24 Yes Johnross Nabozny K, MD  albuterol (VENTOLIN HFA) 108 (90 Base) MCG/ACT inhaler Inhale 2 puffs into the lungs every 6 (six) hours as needed for wheezing or shortness of breath. Patient not taking: Reported on 04/05/2024 06/03/23   [provider]  norgestimate -ethinyl estradiol  (MILI) 0.25-35 MG-MCG tablet Take 1 tablet by mouth daily. Patient not taking: Reported on 04/05/2024    [provider]  norgestimate -ethinyl estradiol  (MILI) 0.25-35 MG-MCG tablet Take 1 tablet by  mouth daily. Patient not taking: Reported on 04/05/2024    [provider]    Family History Family History  Adopted: Yes  Problem Relation Age of Onset   Hypertension Mother    Diabetes Father     Social History Social History   Tobacco Use   Smoking status: Never    Passive exposure: Never   Smokeless tobacco: Never  Vaping Use   Vaping status: Some Days  Substance Use Topics   Alcohol use: Yes    Comment: social   Drug use: Not Currently    Frequency: 7.0 times per week    Types: Marijuana    Comment: stopped 3/6     Allergies   Shellfish allergy, Crab (diagnostic), and Ibuprofen    Review of Systems Review of Systems  HENT:  Positive for ear pain.      Physical Exam Triage Vital Signs ED Triage Vitals  Encounter Vitals Group     BP  04/05/24 0845 123/81     Girls Systolic BP Percentile --      Girls Diastolic BP Percentile --      Boys Systolic BP Percentile --      Boys Diastolic BP Percentile --      Pulse Rate 04/05/24 0845 94     Resp 04/05/24 0845 14     Temp 04/05/24 0845 98.7 F (37.1 C)     Temp Source 04/05/24 0845 Oral     SpO2 04/05/24 0845 98 %     Weight --      Height --      Head Circumference --      Peak Flow --      Pain Score 04/05/24 0846 7     Pain Loc --      Pain Education --      Exclude from Growth Chart --    No data found.  Updated Vital Signs BP 123/81 (BP Location: Left Arm)   Pulse 94   Temp 98.7 F (37.1 C) (Oral)   Resp 14   LMP 03/16/2024 (Exact Date)   SpO2 98%   Visual Acuity Right Eye Distance:   Left Eye Distance:   Bilateral Distance:    Right Eye Near:   Left Eye Near:    Bilateral Near:     Physical Exam Vitals reviewed.  Constitutional:      General: She is not in acute distress.    Appearance: She is not ill-appearing, toxic-appearing or diaphoretic.  HENT:     Right Ear: Tympanic membrane normal.     Left Ear: Tympanic membrane normal.     Ears:     Comments: Bilaterally the tympanic membranes are gray and shiny.  Both ear canals are little irritated looking and on the right the walls of the canal might be a little swollen.  There is a little cerumen on that side also.    Nose: Nose normal.     Mouth/Throat:     Mouth: Mucous membranes are moist.     Comments: Mucous remains moist and pink.  Uvula is in the midline.  Tonsils are not hypertrophied.  There is some mild erythema of the tonsils and there is a white patch on the right tonsil. Eyes:     Extraocular Movements: Extraocular movements intact.     Conjunctiva/sclera: Conjunctivae normal.     Pupils: Pupils are equal, round, and reactive to light.  Cardiovascular:     Rate and Rhythm: Normal rate and regular rhythm.  Heart sounds: No murmur heard. Pulmonary:     Effort: Pulmonary  effort is normal. No respiratory distress.     Breath sounds: No stridor. No wheezing, rhonchi or rales.  Musculoskeletal:     Cervical back: Neck supple.  Lymphadenopathy:     Cervical: No cervical adenopathy.  Skin:    Capillary Refill: Capillary refill takes less than 2 seconds.     Coloration: Skin is not jaundiced or pale.  Neurological:     General: No focal deficit present.     Mental Status: She is alert and oriented to person, place, and time.  Psychiatric:        Behavior: Behavior normal.      UC Treatments / Results  Labs (all labs ordered are listed, but only abnormal results are displayed) Labs Reviewed  POCT RAPID STREP A (OFFICE) - Normal  CULTURE, GROUP A STREP [facility])    EKG   Radiology No results found.  Procedures Procedures (including critical care time)  Medications Ordered in UC Medications - No data to display  Initial Impression / Assessment and Plan / UC Course  I have reviewed the triage vital signs and the nursing notes.  Pertinent labs & imaging results that were available during my care of the patient were reviewed by me and considered in my medical decision making (see chart for details).     Rapid strep is negative.  Throat culture is sent and we will notify and treat protocol if that is positive  Floxin otic is sent in for her ear troubles as there may be some otitis externa at least on the right.  Tylenol  as needed for pain. Final Clinical Impressions(s) / UC Diagnoses   Final diagnoses:  Acute pharyngitis, unspecified etiology  Acute otitis externa of right ear, unspecified type     Discharge Instructions      Your strep test is negative.  Culture of the throat will be sent, and staff will notify you if that is in turn positive.  -Floxin eardrops-5 drops in both ears 2 times daily for 7 days.  Take Tylenol  as needed for pain.  For your sore throat you can also use Cepacol spray.  Make sure you are getting enough  fluids in      ED Prescriptions     Medication Sig Dispense Auth. Provider   ofloxacin (FLOXIN) 0.3 % OTIC solution Place 5 drops into both ears 2 (two) times daily for 7 days. 5 mL Vonna Sharlet POUR, MD      PDMP not reviewed this encounter.   Vonna Sharlet POUR, MD 04/05/24 [phone]

## 2024-04-08 LAB — CULTURE, GROUP A STREP (THRC)

## 2024-04-10 ENCOUNTER — Ambulatory Visit (HOSPITAL_COMMUNITY): Payer: Self-pay
